# Patient Record
Sex: Female | Born: 1975
Health system: Southern US, Community
[De-identification: ages and names within clinical notes are randomized; demographics above are authoritative.]

## PROBLEM LIST (undated history)

## (undated) DIAGNOSIS — R42 Dizziness and giddiness: Secondary | ICD-10-CM

## (undated) DIAGNOSIS — D649 Anemia, unspecified: Secondary | ICD-10-CM

## (undated) DIAGNOSIS — J302 Other seasonal allergic rhinitis: Secondary | ICD-10-CM

## (undated) DIAGNOSIS — R200 Anesthesia of skin: Secondary | ICD-10-CM

## (undated) DIAGNOSIS — E119 Type 2 diabetes mellitus without complications: Secondary | ICD-10-CM

## (undated) DIAGNOSIS — G4730 Sleep apnea, unspecified: Secondary | ICD-10-CM

## (undated) DIAGNOSIS — R102 Pelvic and perineal pain unspecified side: Secondary | ICD-10-CM

## (undated) DIAGNOSIS — F32A Depression, unspecified: Secondary | ICD-10-CM

## (undated) DIAGNOSIS — H269 Unspecified cataract: Secondary | ICD-10-CM

## (undated) DIAGNOSIS — M1990 Unspecified osteoarthritis, unspecified site: Secondary | ICD-10-CM

## (undated) DIAGNOSIS — Z8739 Personal history of other diseases of the musculoskeletal system and connective tissue: Secondary | ICD-10-CM

## (undated) DIAGNOSIS — F419 Anxiety disorder, unspecified: Secondary | ICD-10-CM

## (undated) DIAGNOSIS — M545 Low back pain, unspecified: Secondary | ICD-10-CM

## (undated) DIAGNOSIS — G8929 Other chronic pain: Secondary | ICD-10-CM

## (undated) DIAGNOSIS — J189 Pneumonia, unspecified organism: Secondary | ICD-10-CM

## (undated) DIAGNOSIS — G7111 Myotonic muscular dystrophy: Secondary | ICD-10-CM

## (undated) DIAGNOSIS — E559 Vitamin D deficiency, unspecified: Secondary | ICD-10-CM

## (undated) DIAGNOSIS — T8859XA Other complications of anesthesia, initial encounter: Secondary | ICD-10-CM

## (undated) DIAGNOSIS — K219 Gastro-esophageal reflux disease without esophagitis: Secondary | ICD-10-CM

## (undated) DIAGNOSIS — N809 Endometriosis, unspecified: Secondary | ICD-10-CM

## (undated) HISTORY — DX: Vitamin D deficiency, unspecified: E55.9

## (undated) HISTORY — DX: Anxiety disorder, unspecified: F41.9

## (undated) HISTORY — DX: Type 2 diabetes mellitus without complications: E11.9

## (undated) HISTORY — PX: WISDOM TOOTH EXTRACTION: SHX21

## (undated) HISTORY — DX: Depression, unspecified: F32.A

## (undated) HISTORY — DX: Myotonic muscular dystrophy: G71.11

---

## 1997-04-24 ENCOUNTER — Other Ambulatory Visit: Admission: RE | Admit: 1997-04-24 | Discharge: 1997-04-24 | Payer: Self-pay | Admitting: Obstetrics and Gynecology

## 1997-05-05 ENCOUNTER — Other Ambulatory Visit: Admission: RE | Admit: 1997-05-05 | Discharge: 1997-05-05 | Payer: Self-pay | Admitting: Obstetrics and Gynecology

## 1997-12-01 ENCOUNTER — Other Ambulatory Visit: Admission: RE | Admit: 1997-12-01 | Discharge: 1997-12-01 | Payer: Self-pay | Admitting: Obstetrics and Gynecology

## 1999-03-05 ENCOUNTER — Other Ambulatory Visit: Admission: RE | Admit: 1999-03-05 | Discharge: 1999-03-05 | Payer: Self-pay | Admitting: Obstetrics and Gynecology

## 2000-03-18 ENCOUNTER — Other Ambulatory Visit: Admission: RE | Admit: 2000-03-18 | Discharge: 2000-03-18 | Payer: Self-pay | Admitting: Obstetrics and Gynecology

## 2000-05-04 ENCOUNTER — Other Ambulatory Visit: Admission: RE | Admit: 2000-05-04 | Discharge: 2000-05-04 | Payer: Self-pay | Admitting: Obstetrics and Gynecology

## 2000-08-06 ENCOUNTER — Other Ambulatory Visit: Admission: RE | Admit: 2000-08-06 | Discharge: 2000-08-06 | Payer: Self-pay | Admitting: Obstetrics and Gynecology

## 2000-08-06 ENCOUNTER — Encounter (INDEPENDENT_AMBULATORY_CARE_PROVIDER_SITE_OTHER): Payer: Self-pay | Admitting: *Deleted

## 2000-12-09 ENCOUNTER — Other Ambulatory Visit: Admission: RE | Admit: 2000-12-09 | Discharge: 2000-12-09 | Payer: Self-pay | Admitting: Obstetrics and Gynecology

## 2001-01-04 ENCOUNTER — Encounter: Payer: Self-pay | Admitting: Emergency Medicine

## 2001-01-04 ENCOUNTER — Emergency Department (HOSPITAL_COMMUNITY): Admission: EM | Admit: 2001-01-04 | Discharge: 2001-01-04 | Payer: Self-pay | Admitting: Emergency Medicine

## 2001-05-31 ENCOUNTER — Other Ambulatory Visit: Admission: RE | Admit: 2001-05-31 | Discharge: 2001-05-31 | Payer: Self-pay | Admitting: Obstetrics and Gynecology

## 2002-07-13 ENCOUNTER — Other Ambulatory Visit: Admission: RE | Admit: 2002-07-13 | Discharge: 2002-07-13 | Payer: Self-pay | Admitting: Obstetrics and Gynecology

## 2003-08-18 ENCOUNTER — Other Ambulatory Visit: Admission: RE | Admit: 2003-08-18 | Discharge: 2003-08-18 | Payer: Self-pay | Admitting: Obstetrics and Gynecology

## 2003-08-24 ENCOUNTER — Encounter: Admission: RE | Admit: 2003-08-24 | Discharge: 2003-08-24 | Payer: Self-pay | Admitting: Family Medicine

## 2004-03-05 ENCOUNTER — Ambulatory Visit: Payer: Self-pay | Admitting: Family Medicine

## 2004-09-03 ENCOUNTER — Other Ambulatory Visit: Admission: RE | Admit: 2004-09-03 | Discharge: 2004-09-03 | Payer: Self-pay | Admitting: Obstetrics and Gynecology

## 2005-03-18 ENCOUNTER — Ambulatory Visit: Payer: Self-pay | Admitting: Family Medicine

## 2005-04-08 ENCOUNTER — Ambulatory Visit: Payer: Self-pay | Admitting: Family Medicine

## 2005-04-23 ENCOUNTER — Ambulatory Visit: Payer: Self-pay | Admitting: Family Medicine

## 2005-05-06 ENCOUNTER — Ambulatory Visit: Payer: Self-pay | Admitting: Family Medicine

## 2005-07-11 ENCOUNTER — Ambulatory Visit: Payer: Self-pay | Admitting: Family Medicine

## 2006-01-24 ENCOUNTER — Ambulatory Visit: Payer: Self-pay | Admitting: Family Medicine

## 2006-01-24 ENCOUNTER — Ambulatory Visit (HOSPITAL_COMMUNITY): Admission: RE | Admit: 2006-01-24 | Discharge: 2006-01-24 | Payer: Self-pay | Admitting: Family Medicine

## 2006-01-26 ENCOUNTER — Ambulatory Visit: Payer: Self-pay | Admitting: Family Medicine

## 2006-04-16 ENCOUNTER — Other Ambulatory Visit: Admission: RE | Admit: 2006-04-16 | Discharge: 2006-04-16 | Payer: Self-pay | Admitting: Obstetrics and Gynecology

## 2006-04-17 ENCOUNTER — Ambulatory Visit: Payer: Self-pay | Admitting: Family Medicine

## 2006-04-17 LAB — CONVERTED CEMR LAB: Hgb A1c MFr Bld: 5.9 % (ref 4.6–6.0)

## 2006-05-20 ENCOUNTER — Ambulatory Visit: Payer: Self-pay | Admitting: Family Medicine

## 2006-06-23 ENCOUNTER — Ambulatory Visit: Payer: Self-pay | Admitting: Family Medicine

## 2006-06-23 DIAGNOSIS — F329 Major depressive disorder, single episode, unspecified: Secondary | ICD-10-CM

## 2006-07-24 ENCOUNTER — Ambulatory Visit: Payer: Self-pay | Admitting: Family Medicine

## 2006-08-03 ENCOUNTER — Ambulatory Visit: Payer: Self-pay | Admitting: Family Medicine

## 2006-08-03 DIAGNOSIS — IMO0001 Reserved for inherently not codable concepts without codable children: Secondary | ICD-10-CM

## 2006-08-25 ENCOUNTER — Encounter: Payer: Self-pay | Admitting: Family Medicine

## 2006-09-11 ENCOUNTER — Encounter: Payer: Self-pay | Admitting: Family Medicine

## 2006-09-19 ENCOUNTER — Encounter: Admission: RE | Admit: 2006-09-19 | Discharge: 2006-09-19 | Payer: Self-pay

## 2007-02-25 ENCOUNTER — Ambulatory Visit (HOSPITAL_BASED_OUTPATIENT_CLINIC_OR_DEPARTMENT_OTHER): Admission: RE | Admit: 2007-02-25 | Discharge: 2007-02-25 | Payer: Self-pay | Admitting: General Surgery

## 2007-02-25 ENCOUNTER — Encounter (INDEPENDENT_AMBULATORY_CARE_PROVIDER_SITE_OTHER): Payer: Self-pay | Admitting: General Surgery

## 2007-02-25 HISTORY — PX: MUSCLE BIOPSY: SHX716

## 2007-04-05 ENCOUNTER — Telehealth: Payer: Self-pay | Admitting: Family Medicine

## 2007-04-22 ENCOUNTER — Encounter: Payer: Self-pay | Admitting: Family Medicine

## 2007-05-28 ENCOUNTER — Encounter: Admission: RE | Admit: 2007-05-28 | Discharge: 2007-05-28 | Payer: Self-pay | Admitting: Obstetrics and Gynecology

## 2007-08-11 ENCOUNTER — Encounter: Payer: Self-pay | Admitting: Family Medicine

## 2007-08-25 ENCOUNTER — Encounter: Payer: Self-pay | Admitting: Family Medicine

## 2007-09-13 ENCOUNTER — Other Ambulatory Visit: Admission: RE | Admit: 2007-09-13 | Discharge: 2007-09-13 | Payer: Self-pay | Admitting: Obstetrics and Gynecology

## 2007-09-24 ENCOUNTER — Ambulatory Visit: Payer: Self-pay | Admitting: Family Medicine

## 2007-09-24 DIAGNOSIS — G7111 Myotonic muscular dystrophy: Secondary | ICD-10-CM

## 2007-10-06 ENCOUNTER — Ambulatory Visit: Payer: Self-pay | Admitting: Cardiology

## 2007-10-20 ENCOUNTER — Ambulatory Visit: Payer: Self-pay | Admitting: Family Medicine

## 2007-10-20 DIAGNOSIS — F17200 Nicotine dependence, unspecified, uncomplicated: Secondary | ICD-10-CM

## 2008-02-15 ENCOUNTER — Telehealth: Payer: Self-pay | Admitting: Family Medicine

## 2008-03-27 ENCOUNTER — Encounter: Payer: Self-pay | Admitting: Family Medicine

## 2008-06-08 ENCOUNTER — Emergency Department (HOSPITAL_COMMUNITY): Admission: EM | Admit: 2008-06-08 | Discharge: 2008-06-08 | Payer: Self-pay | Admitting: Emergency Medicine

## 2008-07-11 ENCOUNTER — Ambulatory Visit: Payer: Self-pay | Admitting: Family Medicine

## 2008-07-11 ENCOUNTER — Telehealth: Payer: Self-pay | Admitting: Family Medicine

## 2008-07-11 DIAGNOSIS — R634 Abnormal weight loss: Secondary | ICD-10-CM

## 2008-07-11 DIAGNOSIS — R519 Headache, unspecified: Secondary | ICD-10-CM | POA: Insufficient documentation

## 2008-07-11 DIAGNOSIS — R51 Headache: Secondary | ICD-10-CM

## 2008-09-25 ENCOUNTER — Ambulatory Visit: Payer: Self-pay | Admitting: Obstetrics and Gynecology

## 2008-12-18 ENCOUNTER — Encounter (INDEPENDENT_AMBULATORY_CARE_PROVIDER_SITE_OTHER): Payer: Self-pay | Admitting: *Deleted

## 2009-01-13 HISTORY — PX: BREAST BIOPSY: SHX20

## 2009-02-13 HISTORY — PX: OTHER SURGICAL HISTORY: SHX169

## 2009-02-14 ENCOUNTER — Other Ambulatory Visit: Admission: RE | Admit: 2009-02-14 | Discharge: 2009-02-14 | Payer: Self-pay | Admitting: Obstetrics and Gynecology

## 2009-02-14 ENCOUNTER — Ambulatory Visit: Payer: Self-pay | Admitting: Obstetrics and Gynecology

## 2009-02-19 ENCOUNTER — Telehealth: Payer: Self-pay | Admitting: Family Medicine

## 2009-02-21 ENCOUNTER — Encounter: Admission: RE | Admit: 2009-02-21 | Discharge: 2009-02-21 | Payer: Self-pay | Admitting: Obstetrics and Gynecology

## 2009-02-28 ENCOUNTER — Ambulatory Visit: Payer: Self-pay | Admitting: Women's Health

## 2009-05-29 ENCOUNTER — Ambulatory Visit: Payer: Self-pay | Admitting: Gynecology

## 2009-09-06 ENCOUNTER — Telehealth: Payer: Self-pay | Admitting: Family Medicine

## 2009-09-12 ENCOUNTER — Ambulatory Visit: Payer: Self-pay | Admitting: Family Medicine

## 2009-11-07 ENCOUNTER — Ambulatory Visit: Payer: Self-pay | Admitting: Obstetrics and Gynecology

## 2009-11-16 ENCOUNTER — Encounter: Admission: RE | Admit: 2009-11-16 | Discharge: 2009-11-16 | Payer: Self-pay | Admitting: Obstetrics and Gynecology

## 2009-12-26 ENCOUNTER — Ambulatory Visit: Payer: Self-pay | Admitting: Family Medicine

## 2010-02-14 NOTE — Progress Notes (Signed)
Summary: refill lyrica  Phone Note From Pharmacy   [facility]er: [facility]  [phone]* Call For: Valerie Friedman  Summary of Call: refill lyrica 25mg  1 by mouth two times a day Initial call taken by: Alfred Levins, CMA,  February 19, 2009 9:25 AM  Follow-up for Phone Call        call in #60 with 11 rf Follow-up by: Nelwyn Salisbury MD,  February 19, 2009 12:51 PM  Additional Follow-up for Phone Call Additional follow up Details #1::        Phone call completed, Pharmacist called Additional Follow-up by: Alfred Levins, CMA,  February 19, 2009 3:41 PM    Prescriptions: LYRICA 25 MG CAPS (PREGABALIN) two times a day  #60 x 11   Entered by:   Alfred Levins, CMA   Authorized by:   Nelwyn Salisbury MD   Signed by:   Alfred Levins, CMA on 02/19/2009   Method used:   Telephoned to ...       [facility]  [phone]* (retail)       [address] [place], [place]  [address]       Ph: [phone] or [phone]       Fax: [phone]   RxID:   [id number]

## 2010-02-14 NOTE — Assessment & Plan Note (Signed)
Summary: med check/refill/cjr/pt rsc/cjr   Vital Signs:  Patient profile:   35 year old female Weight:      102 pounds BP sitting:   84 / 50  (left arm) Cuff size:   regular  Vitals Entered By: Raechel Ache, RN (September 12, 2009 3:37 PM) CC: Not sure why she's here - meds ok.   History of Present Illness: Here for med refills. She is doing fairly well with her back pain and migraines. She averages about 5 or 6 migraines a year. No other health changes  Allergies: 1)  * Yellow Dye 2)  Vicodin (Hydrocodone-Acetaminophen)  Past History:  Past Medical History: Reviewed history from 07/11/2008 and no changes required. Depression myotonic muscular dystrophy, sees Dr. Lyndel Safe at [facility] Neurology fibromyalgia Headache  Review of Systems  The patient denies anorexia, fever, weight loss, weight gain, vision loss, decreased hearing, hoarseness, chest pain, syncope, dyspnea on exertion, peripheral edema, prolonged cough, hemoptysis, abdominal pain, melena, hematochezia, severe indigestion/heartburn, hematuria, incontinence, genital sores, muscle weakness, suspicious skin lesions, transient blindness, difficulty walking, depression, unusual weight change, abnormal bleeding, enlarged lymph nodes, angioedema, breast masses, and testicular masses.    Physical Exam  General:  Well-developed,well-nourished,in no acute distress; alert,appropriate and cooperative throughout examination Neck:  No deformities, masses, or tenderness noted. Lungs:  Normal respiratory effort, chest expands symmetrically. Lungs are clear to auscultation, no crackles or wheezes. Heart:  Normal rate and regular rhythm. S1 and S2 normal without gallop, murmur, click, rub or other extra sounds. Neurologic:  alert & oriented X3, cranial nerves II-XII intact, strength normal in all extremities, and gait normal.     Impression & Recommendations:  Problem # 1:  HEADACHE (ICD-784.0)  The following medications were  removed from the medication list:    Ibuprofen 800 Mg Tabs (Ibuprofen) .Marland Kitchen... 1 by mouth once daily Her updated medication list for this problem includes:    Tramadol Hcl 50 Mg Tabs (Tramadol hcl) .Marland Kitchen... 1 or 2 two times a day    Midrin 325-65-100 Mg Caps (Apap-isometheptene-dichloral) .Marland Kitchen... As directed for ha's  Problem # 2:  DEPRESSION (ICD-311)  Problem # 3:  MYALGIA/MYOSITIS NOS (ICD-729.1)  The following medications were removed from the medication list:    Ibuprofen 800 Mg Tabs (Ibuprofen) .Marland Kitchen... 1 by mouth once daily Her updated medication list for this problem includes:    Tramadol Hcl 50 Mg Tabs (Tramadol hcl) .Marland Kitchen... 1 or 2 two times a day  Complete Medication List: 1)  Tramadol Hcl 50 Mg Tabs (Tramadol hcl) .Marland Kitchen.. 1 or 2 two times a day 2)  Midrin 325-65-100 Mg Caps (Apap-isometheptene-dichloral) .... As directed for ha's 3)  Lyrica 25 Mg Caps (Pregabalin) .... Two times a day  Patient Instructions: 1)  Please schedule a follow-up appointment in 6 months .  Prescriptions: TRAMADOL HCL 50 MG TABS (TRAMADOL HCL) 1 or 2 two times a day  #120 x 5   Entered and Authorized by:   Nelwyn Salisbury MD   Signed by:   Nelwyn Salisbury MD on 09/12/2009   Method used:   Print then Give to Patient   RxID:   [id number] LYRICA 25 MG CAPS (PREGABALIN) two times a day  #60 x 5   Entered and Authorized by:   Nelwyn Salisbury MD   Signed by:   Nelwyn Salisbury MD on 09/12/2009   Method used:   Print then Give to Patient   RxID:   [id number]

## 2010-02-14 NOTE — Progress Notes (Signed)
Summary: meds  Phone Note Outgoing Call   Call placed by: Raechel Ache, RN,  September 06, 2009 10:58 AM Call placed to: Patient Summary of Call: advised she needs OV before refills- she's scheduled for tomorrow- asking for a few Tramadol- is completely out and having withdrawal. Initial call taken by: Raechel Ache, RN,  September 06, 2009 10:59 AM  Follow-up for Phone Call        call in Tramadol #30 with no rf Follow-up by: Nelwyn Salisbury MD,  September 06, 2009 11:19 AM    Prescriptions: TRAMADOL HCL 50 MG TABS (TRAMADOL HCL) 1 or 2 every 6 hours as needed pain  #30 x 0   Entered by:   Raechel Ache, RN   Authorized by:   Nelwyn Salisbury MD   Signed by:   Raechel Ache, RN on 09/06/2009   Method used:   Electronically to        [facility]  [phone]* (retail)       [address] [place], [place]  [address]       Ph: [phone] or [phone]       Fax: [phone]   RxID:   [id number]

## 2010-02-14 NOTE — Progress Notes (Signed)
Summary: Call A Nurse   Call-A-Nurse Triage Call Report Triage Record Num: [id number] Operator: Albertine Grates Patient Name: Valerie Friedman Call Date & Time: 09/05/2009 7:48:47PM Patient Phone: [phone] PCP: Tera Mater. Clent Ridges Patient Gender: Female PCP Fax : [phone] Patient DOB: 1975-04-09 Practice Name: [facility] Reason for Call: States takes Tramadol 100mg  BID and has taken evening dose. Is out of med and is needing refilled. Pharmacy faxed refill request 1 week ago but has not been filled. Is wanting morning dose and enough med until gets script filled 8-25. Advised to call office 8-25. Protocol(s) Used: Medication Question Calls, No Triage (Adults) Recommended Outcome per Protocol: Call Provider within 24 Hours Reason for Outcome: Caller requesting a non urgent new prescription or refill and triager unable to refill per unit policy Care Advice:  ~ 08/

## 2010-02-20 ENCOUNTER — Other Ambulatory Visit: Payer: Self-pay

## 2010-03-14 ENCOUNTER — Encounter: Payer: Self-pay | Admitting: Women's Health

## 2010-03-14 ENCOUNTER — Other Ambulatory Visit (HOSPITAL_COMMUNITY)
Admission: RE | Admit: 2010-03-14 | Discharge: 2010-03-14 | Disposition: A | Discharge status: Home / Self Care | Payer: Medicare Other | Source: Ambulatory Visit | Attending: Obstetrics and Gynecology | Admitting: Obstetrics and Gynecology

## 2010-03-14 ENCOUNTER — Encounter (INDEPENDENT_AMBULATORY_CARE_PROVIDER_SITE_OTHER): Payer: Medicare Other | Admitting: Obstetrics and Gynecology

## 2010-03-14 ENCOUNTER — Other Ambulatory Visit: Payer: Self-pay | Admitting: Women's Health

## 2010-03-14 ENCOUNTER — Ambulatory Visit: Payer: Self-pay | Admitting: Family Medicine

## 2010-03-14 DIAGNOSIS — N6019 Diffuse cystic mastopathy of unspecified breast: Secondary | ICD-10-CM

## 2010-03-14 DIAGNOSIS — N912 Amenorrhea, unspecified: Secondary | ICD-10-CM

## 2010-03-14 DIAGNOSIS — Z124 Encounter for screening for malignant neoplasm of cervix: Secondary | ICD-10-CM | POA: Insufficient documentation

## 2010-03-14 DIAGNOSIS — B373 Candidiasis of vulva and vagina: Secondary | ICD-10-CM

## 2010-03-14 HISTORY — PX: OTHER SURGICAL HISTORY: SHX169

## 2010-03-22 ENCOUNTER — Other Ambulatory Visit: Payer: Self-pay | Admitting: Family Medicine

## 2010-03-22 NOTE — Telephone Encounter (Signed)
Pt called about refill for Tramadol 50mg ...Marland KitchenMarland KitchenMarland Kitchen [facility]... Pt has appt with Dr Clent Ridges on 3.12.12 for f/u.

## 2010-03-25 ENCOUNTER — Ambulatory Visit: Payer: Medicare Other | Admitting: Family Medicine

## 2010-03-29 ENCOUNTER — Encounter: Payer: Self-pay | Admitting: Family Medicine

## 2010-04-05 ENCOUNTER — Ambulatory Visit (INDEPENDENT_AMBULATORY_CARE_PROVIDER_SITE_OTHER): Payer: Medicare Other | Admitting: Family Medicine

## 2010-04-05 ENCOUNTER — Encounter: Payer: Self-pay | Admitting: Family Medicine

## 2010-04-05 VITALS — BP 90/60 | HR 90 | Temp 98.7°F | Wt 103.0 lb

## 2010-04-05 DIAGNOSIS — R599 Enlarged lymph nodes, unspecified: Secondary | ICD-10-CM

## 2010-04-05 DIAGNOSIS — R591 Generalized enlarged lymph nodes: Secondary | ICD-10-CM

## 2010-04-05 DIAGNOSIS — R110 Nausea: Secondary | ICD-10-CM

## 2010-04-05 DIAGNOSIS — M791 Myalgia, unspecified site: Secondary | ICD-10-CM

## 2010-04-05 MED ORDER — PREGABALIN 25 MG PO CAPS: 25.0000 mg | ORAL_CAPSULE | Freq: Two times a day (BID) | ORAL | Status: DC

## 2010-04-05 MED ORDER — TRAMADOL HCL 50 MG PO TABS: 50.0000 mg | ORAL_TABLET | Freq: Four times a day (QID) | ORAL | Status: DC | PRN

## 2010-04-05 NOTE — Progress Notes (Signed)
  Subjective:    Patient ID: Valerie Friedman, female    DOB: 22-Feb-1975, 35 y.o.   MRN: [id number]  HPI Here for several issues. First about 2 weeks ago she developed several tender enlarged nodes in the neck. No fever or ST or URI symptoms. She saw Dr. Annalee Genta 3 days ago, and he started her on 10 days of Augmentin. The nodes are already less tender than before but are still the same size. She asks me basically for a second opinion. Also, she needs refills on tramadol and Lyrica. Also she has had chronic nausea every day for the past 6 months. No heartburn or trouble swallowing. No abdominal pain. Appetite is normal. She recently had a negative pregnancy test and a normal exam per Dr. Eda Paschal.    Review of Systems  Constitutional: Negative.   Respiratory: Negative.   Gastrointestinal: Positive for nausea. Negative for vomiting and abdominal pain.  Genitourinary: Negative.   Neurological: Positive for headaches.  Hematological: Positive for adenopathy.       Objective:   Physical Exam  Constitutional: She is oriented to person, place, and time. She appears well-developed and well-nourished.  HENT:  Head: Normocephalic and atraumatic.  Right Ear: External ear normal.  Left Ear: External ear normal.  Nose: Nose normal.  Mouth/Throat: Oropharynx is clear and moist. No oropharyngeal exudate.  Eyes: Conjunctivae are normal. Pupils are equal, round, and reactive to light.  Neck: Normal range of motion. Neck supple. No thyromegaly present.       Has two small nontender nodes in the neck, one on each side   Abdominal: Soft. Bowel sounds are normal. She exhibits no distension and no mass. There is no tenderness. There is no rebound and no guarding.  Neurological: She is alert and oriented to person, place, and time. She has normal reflexes. No cranial nerve deficit.          Assessment & Plan:  These are probably reactive nodes, and I agree with her taking Augmentin. She is supposed  to follow up with Dr. Annalee Genta in 2 weeks, and I encouraged her to keep this appt. We will arrange for her to have some labs drawn including a CBC. Refilled her meds. As for the nausea, this may be gastritis or GERD, so I asked her to try Prilosec OTC every day.

## 2010-04-16 ENCOUNTER — Telehealth: Payer: Self-pay | Admitting: *Deleted

## 2010-04-16 NOTE — Telephone Encounter (Signed)
Pt's Mom is going to be drawing pt's blood before the next visit, and needs a list of what was ordered. Did not see it in chart?  Please call Mom with list of labs.

## 2010-04-17 NOTE — Telephone Encounter (Signed)
Left mess on her mobile phone as to what Dr Clent Ridges wants for labs. Instructed for call back if questions.

## 2010-04-17 NOTE — Telephone Encounter (Signed)
She needs a CBC with differential, BMET, hepatic panel, amylase, and  TSH

## 2010-05-03 ENCOUNTER — Encounter: Payer: Self-pay | Admitting: Family Medicine

## 2010-05-28 NOTE — Op Note (Signed)
Valerie Friedman, Valerie Friedman            ACCOUNT NO.:  [id number]   MEDICAL RECORD NO.:  [id number]          PATIENT TYPE:  AMB   LOCATION:  [facility]SC                          FACILITY:  [facility]   PHYSICIAN:  Angelia Mould. Derrell Lolling, M.D.DATE OF BIRTH:  1975-02-18   DATE OF PROCEDURE:  02/25/2007  DATE OF DISCHARGE:                               OPERATIVE REPORT   PREOPERATIVE DIAGNOSIS:  Myositis.   POSTOPERATIVE DIAGNOSIS:  Myositis.   OPERATION PERFORMED:  Left thigh quadriceps muscle biopsy x3.   SURGEON:  Angelia Mould. Derrell Lolling, M.D.   OPERATIVE INDICATIONS:  This is a 35 year old white female who has had  muscle aches and pains in the lower back and sometimes thighs.  She has  early fatigability.  She has had elevated CPK.  She has been evaluated  by Dr. Chase Picket.  He has requested a muscle biopsy to clarify her  diagnosis.  She has been counseled as an outpatient regarding this.  She  is brought to operating room electively.   OPERATIVE TECHNIQUE:  The patient was placed supine on the operating  table.  A general anesthesia using an LMA device was performed.  No  muscle relaxers were used.  The patient was identified as correct  patient and correct procedure and correct site.  Intravenous antibiotics  were given.  The left thigh was prepped and draped in a sterile fashion.  Then 0.5% Marcaine with epinephrine was used as local infiltration  anesthetic infiltrating the skin, and subcutaneous tissue only.  A  longitudinal incision was made in the proximal anterolateral left thigh.  Dissection was carried down through the subcutaneous tissue.   I incised the deep investing fascia of the muscle bundles.  Self-  retaining retractors were placed.  I isolated 3 separate bundles of  muscle.  I tried to get a bundle of muscle that was 1 cm in cross-  sectional diameter and about 2.5 cm in length.  I isolated these and  clamped them proximally and distally with hemostats, and divided the  muscle with  a knife.  I placed it on a tongue blade and then stapled it  with staples, and then wrapped it in saline moistened gauze.  The muscle  ends were tied off with 2-0 Vicryl ties.  I did this 3 separate times.  Hemostasis was excellent.   The wound was irrigated with saline.  The deeper tissues were closed  with a running suture of 2-0 Vicryl and the skin closed with running  subcuticular suture of 4-0 Monocryl and Steri-Strips.  Clean bandages  were placed, and the patient taken to the recovery room in stable  condition.  Estimated blood loss was about 10 mL.  Complications were  none.  Sponge, needle, and counts were correct.     Angelia Mould. Derrell Lolling, M.D.  Electronically Signed    HMI/MEDQ  D:  02/25/2007  T:  02/26/2007  Job:  [id number]   cc:   Areatha Keas, M.D.

## 2010-05-28 NOTE — Assessment & Plan Note (Signed)
[facility]                            CARDIOLOGY OFFICE NOTE   [name], Friedman                   MRN:          [id number]  DATE:10/06/2007                            DOB:          Dec 17, 1975    I was asked by Dr. Gershon Crane to evaluate Valerie Friedman with  question of cardiac involvement with myotonic dystrophy.   HISTORY OF PRESENT ILLNESS:  She is 35 years of age, single white female  who began to have neck pain, back pain, and weakness after exercising.  This started about 2 years ago.  In July 2009, she was diagnosed with  myotonic dystrophy by Dr. Melbourne Abts.   Because of an association of electrical abnormalities, conduction  abnormalities, and cardiomyopathies with myotonic dystrophy, she is  referred for evaluation.   She denies any palpitations, presyncope, or syncope.  She has not been  exercising that much lately, but has no exertion related symptoms such  as dyspnea on exertion.  She has had no orthopnea, PND, or peripheral  edema.   She does have a history of some vertigo intermittently, but that has  been going on for about 6 years.  She has had no true syncope, however.   PAST MEDICAL HISTORY:  Intolerant of Vicodin, which causes dizziness.  She smokes half-a-pack of cigarettes a day and has for 10 years.  She  drinks about 3 alcoholic beverages a week.   CURRENT MEDICATIONS:  1. Lyrica 25 mg p.o. b.i.d.  2. Birth control.  3. Tramadol 200 mg b.i.d.   SURGICAL HISTORY:  Had multiple biopsy in 2009.   FAMILY HISTORY:  Negative for premature coronary disease or sudden  cardiac death.   SOCIAL HISTORY:  She became disabled in July 2009.   REVIEW OF SYSTEMS:  Other than some seasonal allergies, menstrual  dysfunction and some anxiety is negative.   PHYSICAL EXAMINATION:  VITAL SIGNS:  Blood pressure is 92/60, her pulse  is 72 and regular, her weight is 114.  She is 5 feet 2-1/4 inches.  HEENT:  Normocephalic and  atraumatic.  PERRLA.  Extraocular movement is  intact.  Sclerae are clear.  Facial symmetry is normal.  NECK:  Carotids upstrokes were equal bilaterally without bruits.  No  JVD.  Thyroid is not enlarged.  Trachea is midline.  Neck is supple.  LUNGS:  Clear to auscultation and percussion.  HEART:  Reveals a regular rate and rhythm.  Nondisplaced PMI.  Normal S1  and S2.  No gallop.  ABDOMEN:  Soft.  No midline bruit.  No pulsatile mass.  No  hepatomegalia.  EXTREMITIES:  No muscle tenderness.  Pulses are 4+ bilaterally  symmetrical.  There is no sign of DVT.  NEUROLOGIC:  Grossly intact.  SKIN:  Unremarkable.   EKG is completely normal with normal PR, QRS, and QTc interval.  There  is no ectopy.  There is no ST-segment changes.  There is no bundle-  branch block.   ASSESSMENT AND PLAN:  Myotonic dystrophy, clinically there is no  evidence of myocardial involvement though this may be missed by clinical  exam,  not to mention history.  It is encouraged and her EKG is normal  with no conduction abnormalities or electrical abnormalities.   PLAN:  A 2-D echocardiogram to assess for any myocardial involvement.  If her echocardiogram is normal, appropriate reassurance will be given.  I will see her back in 6 months.  If she does have significant LV  dysfunction, we referred her to electrophysiology for further evaluation  and possible defibrillator implantation.     Thomas C. Daleen Squibb, MD, [facility]  Electronically Signed    TCW/MedQ  DD: 10/06/2007  DT: 10/07/2007  Job #: [id number]   cc:   Jeannett Senior A. Clent Ridges, MD  Evie Lacks, MD

## 2010-05-31 NOTE — Assessment & Plan Note (Signed)
[facility]                        [facility] OFFICE NOTE   SHERLE, MELLO                   MRN:          [id number]  DATE:01/24/2006                            DOB:          June 29, 1975    HISTORY OF PRESENT ILLNESS:  Ms. Valerie Friedman is a 35 year old female  presenting with a 2-week history of a left-sided headache.  She reports  that 1 month ago she was treated for a sinus infection by her  gynecologist with a Z-pack.  She reports that she felt better but then  started to have a headache behind her left eye that radiated up to the  top of her head.  She described it as a constant pressure-type headache.  The pain was 8 out of 10 on a pain scale.  She reports she  intermittently has had some dizziness that she attributed to her  allergies.  She also has some blurry vision off and on for the last 2 to  3 weeks.  She reports that she presented to the office yesterday  afternoon and was scheduled to be seen at the Saturday clinic today.   The patient states that she has tried some over-the-counter  decongestant, Tylenol and Advil with minimal improvement.  She states  that she has had worse headaches in the past, this one is more constant.  According to Ms. Alberson her mother, who works in a laboratory, had a  CBC done and it was unremarkable, but there was a question of whether  there was a low white blood count or hemoglobin.  She denies any syncope  or presyncopal episodes.  She denies any nausea, but has had a couple of  episodes of vomiting.   PAST MEDICAL HISTORY:  Depression.   MEDICATIONS:  1. Lexapro.  2. Ortho Tri-Cyclen.   ALLERGIES:  None, but VICODIN MAKES HER FEEL DRUNK.  She can tolerate  Darvocet with no problems.   REVIEW OF SYSTEMS:  As per HPI, otherwise unremarkable.   OBJECTIVE:  Temperature 96.0, pulse 61, blood pressure 95/66.  GENERAL:  We have a pleasant female who appears in no acute distress,  answers questions appropriately, alert and oriented x3.  Does not appear  to be in significant pain at this time.  Appears comfortable.  HEENT:  Normocephalic, atraumatic.  Pupils were equal and reactive to  light, extraocular muscles were intact.  No nystagmus was appreciated.  Nasal mucosa was boggy with yellow to clear nasal discharge.  Oropharynx  was unremarkable.  NECK:  Supple, no lymphadenopathy, carotid bruits or JVD.  No  thyromegaly.  LUNGS:  Clear.  HEART:  Regular rate and rhythm, normal S1, S2, no murmurs, gallops or  rubs.  EXTREMITIES:  No cyanosis, clubbing or edema.  NEUROLOGIC:  Cranial nerves II-XII grossly intact, no focal sensory or  motor deficits noted.  Deep tendon reflexes are 2+ and equal  bilaterally, no cerebellar dysfunction was noted.  No pronator drift or  gait disturbance was appreciated.   IMPRESSION:  [age]-year-old female presenting with a 2 to 3-week  history of left-sided headache.  The patient does have a strong history  of allergies as well as a current sinusitis, but given how long she has  had this headache further evaluation was warranted.   EVALUATION COURSE:  1. Ms. Youse was initially advised to go to the emergency      department, given her above symptoms and the question of abnormal      CBC.  I do not feel comfortable with her disposition  until at      least a CT scan and a CBC is done.  There was a misunderstanding on      the instructions, and Ms. Ostrosky went to [facility].      Dr. Bradly Chris called me to advise me that she was there.  I went ahead      and gave an order for a CT scan of the head, and subsequently      received the report that stated no acute intracranial findings,      right chronic maxillary sinus disease.  I was able to speak to Ms.      Agramonte and advise her of the above finding.  I was also able to      speak to her mother, Valerie Friedman.  She did confirm with me      that her white  count was within normal limit at 6.8, and her      hemoglobin and hematocrit were also normal.  Given the normal CT      scan and confirmation of the CBC by her mother as well as Ms.      Demby being stable in no significant distress, I recommended      followup with Dr. Clent Ridges Monday.  Precautions were obviously reviewed      with both her and her mother.  I recommended that if her headaches      worsen or are complicated with any other symptoms she is to be      assessed immediately in the emergency department regardless of a      unremarkable CT scan.  2. I did provide a prescription for Darvocet N 100, which was [facility]      [facility], phone number [phone].  She is to take 1      pill every 6 hours as needed for headache.  She was given #10 with      no refills, and the pharmacy was advised to review side effects      with the patient.  The patient was comfortable with the plan, and      will follow up accordingly.     Leanne Chang, M.D.  Electronically Signed    LA/MedQ  DD: 01/24/2006  DT: 01/24/2006  Job #: [id number]   cc:   Jeannett Senior A. Clent Ridges, MD

## 2010-06-12 ENCOUNTER — Telehealth: Payer: Self-pay | Admitting: *Deleted

## 2010-06-12 DIAGNOSIS — M791 Myalgia, unspecified site: Secondary | ICD-10-CM

## 2010-06-12 NOTE — Telephone Encounter (Signed)
Request from pharmacy to fill #20 of Tramadol 50mg  tab [last filled 04/05/10 #120x5], due to patient leaving med bottle in [place] & needing enough to cover until insurance will pay for refill on 06/15/10. Please advise.

## 2010-06-13 MED ORDER — TRAMADOL HCL 50 MG PO TABS: 50.0000 mg | ORAL_TABLET | Freq: Four times a day (QID) | ORAL | Status: DC | PRN

## 2010-06-13 NOTE — Telephone Encounter (Signed)
Rx Done . 

## 2010-06-13 NOTE — Telephone Encounter (Signed)
Please call in #20 as above

## 2010-06-14 ENCOUNTER — Other Ambulatory Visit: Payer: Self-pay | Admitting: *Deleted

## 2010-06-14 DIAGNOSIS — M791 Myalgia, unspecified site: Secondary | ICD-10-CM

## 2010-06-14 NOTE — Telephone Encounter (Signed)
patient  Is aware rx ready at pharmacy

## 2010-07-02 ENCOUNTER — Ambulatory Visit: Payer: Medicare Other | Admitting: Obstetrics and Gynecology

## 2010-07-03 ENCOUNTER — Ambulatory Visit: Payer: Medicare Other | Admitting: Obstetrics and Gynecology

## 2010-08-05 ENCOUNTER — Encounter: Payer: Self-pay | Admitting: Family Medicine

## 2010-08-05 ENCOUNTER — Ambulatory Visit (INDEPENDENT_AMBULATORY_CARE_PROVIDER_SITE_OTHER): Payer: Medicare Other | Admitting: Family Medicine

## 2010-08-05 VITALS — BP 98/68 | HR 92 | Temp 98.7°F | Wt 105.0 lb

## 2010-08-05 DIAGNOSIS — M25569 Pain in unspecified knee: Secondary | ICD-10-CM

## 2010-08-05 DIAGNOSIS — G7111 Myotonic muscular dystrophy: Secondary | ICD-10-CM

## 2010-08-05 DIAGNOSIS — M791 Myalgia, unspecified site: Secondary | ICD-10-CM

## 2010-08-05 LAB — CBC WITH DIFFERENTIAL/PLATELET
Eosinophils Relative: 0.7 % (ref 0.0–5.0)
Hemoglobin: 13.9 g/dL (ref 12.0–15.0)
Lymphs Abs: 1.3 10*3/uL (ref 0.7–4.0)
Monocytes Relative: 4.5 % (ref 3.0–12.0)
Neutro Abs: 4.8 10*3/uL (ref 1.4–7.7)
Neutrophils Relative %: 74.7 % (ref 43.0–77.0)
Platelets: 209 10*3/uL (ref 150.0–400.0)
RBC: 4.45 Mil/uL (ref 3.87–5.11)

## 2010-08-05 LAB — BASIC METABOLIC PANEL
BUN: 11 mg/dL (ref 6–23)
CO2: 29 mEq/L (ref 19–32)
Calcium: 9.2 mg/dL (ref 8.4–10.5)
Creatinine, Ser: 0.6 mg/dL (ref 0.4–1.2)
Potassium: 4.6 mEq/L (ref 3.5–5.1)
Sodium: 139 mEq/L (ref 135–145)

## 2010-08-05 LAB — TSH: TSH: 0.5 u[IU]/mL (ref 0.35–5.50)

## 2010-08-05 LAB — HEPATIC FUNCTION PANEL
AST: 24 U/L (ref 0–37)
Albumin: 4.2 g/dL (ref 3.5–5.2)
Alkaline Phosphatase: 38 U/L — ABNORMAL LOW (ref 39–117)
Total Bilirubin: 0.6 mg/dL (ref 0.3–1.2)
Total Protein: 7.6 g/dL (ref 6.0–8.3)

## 2010-08-05 MED ORDER — TRAMADOL HCL 50 MG PO TABS: 50.0000 mg | ORAL_TABLET | Freq: Four times a day (QID) | ORAL | Status: DC | PRN

## 2010-08-05 NOTE — Progress Notes (Signed)
  Subjective:    Patient ID: Valerie Friedman, female    DOB: 1975/12/07, 35 y.o.   MRN: [id number]  HPI Here asking for med refills and for a referral. She has had bilateral knee pain for years, the right being worse than the left. She saw an orthopedist in [place] 10 yrs ago, and he told her it was patellofemoral syndrome. Now it is getting worse, and she has daily pain. Using Tramadol and Advil.    Review of Systems  Constitutional: Negative.   Musculoskeletal: Positive for arthralgias.       Objective:   Physical Exam  Constitutional: She appears well-developed and well-nourished.  Musculoskeletal:       The right knee has crepitus and tenderness around the patella. Full ROM. No joint space tenderness or edema          Assessment & Plan:  Refer to orthopedics.

## 2010-09-20 ENCOUNTER — Encounter: Payer: Self-pay | Admitting: Obstetrics and Gynecology

## 2010-09-20 ENCOUNTER — Ambulatory Visit (INDEPENDENT_AMBULATORY_CARE_PROVIDER_SITE_OTHER): Payer: Medicare Other | Admitting: Obstetrics and Gynecology

## 2010-09-20 VITALS — Wt 102.0 lb

## 2010-09-20 DIAGNOSIS — R5381 Other malaise: Secondary | ICD-10-CM

## 2010-09-20 DIAGNOSIS — M797 Fibromyalgia: Secondary | ICD-10-CM | POA: Insufficient documentation

## 2010-09-20 DIAGNOSIS — G43909 Migraine, unspecified, not intractable, without status migrainosus: Secondary | ICD-10-CM | POA: Insufficient documentation

## 2010-09-20 DIAGNOSIS — R509 Fever, unspecified: Secondary | ICD-10-CM

## 2010-09-20 DIAGNOSIS — Z113 Encounter for screening for infections with a predominantly sexual mode of transmission: Secondary | ICD-10-CM

## 2010-09-20 DIAGNOSIS — Z20828 Contact with and (suspected) exposure to other viral communicable diseases: Secondary | ICD-10-CM

## 2010-09-20 DIAGNOSIS — B3731 Acute candidiasis of vulva and vagina: Secondary | ICD-10-CM

## 2010-09-20 DIAGNOSIS — N898 Other specified noninflammatory disorders of vagina: Secondary | ICD-10-CM

## 2010-09-20 DIAGNOSIS — B373 Candidiasis of vulva and vagina: Secondary | ICD-10-CM

## 2010-09-20 DIAGNOSIS — G7111 Myotonic muscular dystrophy: Secondary | ICD-10-CM | POA: Insufficient documentation

## 2010-09-20 DIAGNOSIS — R6883 Chills (without fever): Secondary | ICD-10-CM

## 2010-09-20 DIAGNOSIS — R5383 Other fatigue: Secondary | ICD-10-CM

## 2010-09-20 DIAGNOSIS — Z1159 Encounter for screening for other viral diseases: Secondary | ICD-10-CM

## 2010-09-20 DIAGNOSIS — L293 Anogenital pruritus, unspecified: Secondary | ICD-10-CM

## 2010-09-20 MED ORDER — TERCONAZOLE 0.8 % VA CREA: 1.0000 | TOPICAL_CREAM | Freq: Every day | VAGINAL | Status: AC

## 2010-09-20 NOTE — Progress Notes (Signed)
The patient came to see me with 3 separate problems. The first is that she did breasts exam the other night and thought that she was more lumpier  in the left breasts then previously. The second is that she has a heavy vaginal discharge which is bothering her. she's also having some vulvitis with it. She tried Monistat without results. She also gets dysmenorrhea with her periods that does not respond to ibuprofen. She does take Percocet for her knee but feels that she took 2 pills of that daily during her period that would resolve the above. She has recently started a new relationship and wants to be checked for STD. Please also note that she's been assessed with mammography and ultrasound in this area of her left breast in November of 2011.  Breasts: Carefully examined bilaterally both sitting and lying. She is more lumpy in the upper outer quadrant of the left breast and lower outer quadrant but there are no dominant lesions.  External: Within normal limits BUS: Within normal limits vaginal exam: Increase discharge with wet prep positive for yeast cervix: Within normal limits. Uterus normal size and shape. Adnexa: No masses. Rectovaginal negative.  Assessment: 1. Yeast vaginitis 2. Dysmenorrhea 3. Fibrocystic breasts  Plan: Patient was treated with terconazole 3 cream today. Patient was reassured about her breasts today. Told her she certainly change rather than waiting a 40 for her next mammogram we should do another one at that time. STD testing done. Tylox #20 with no refills for dysmenorrhea.

## 2010-09-21 LAB — HIV ANTIBODY (ROUTINE TESTING W REFLEX): HIV: NONREACTIVE

## 2010-10-04 LAB — COMPREHENSIVE METABOLIC PANEL
ALT: 18
AST: 26
CO2: 29
Calcium: 9.8
Chloride: 105
GFR calc Af Amer: >60
GFR calc non Af Amer: >60
Glucose, Bld: 106 — ABNORMAL HIGH
Sodium: 142
Total Bilirubin: 0.7

## 2010-10-04 LAB — CBC
Hemoglobin: 13.1
MCHC: 33.5
MCV: 90.4
RBC: 4.32
WBC: 7.7

## 2010-10-04 LAB — DIFFERENTIAL
Basophils Absolute: 0
Basophils Relative: 0
Eosinophils Absolute: 0
Eosinophils Relative: 1
Neutrophils Relative %: 70

## 2010-10-04 LAB — URINALYSIS, ROUTINE W REFLEX MICROSCOPIC
Bilirubin Urine: NEGATIVE
Hgb urine dipstick: NEGATIVE
Specific Gravity, Urine: 1.027
Urobilinogen, UA: 0.2
pH: 5.5

## 2010-10-04 LAB — HCG, SERUM, QUALITATIVE: Preg, Serum: NEGATIVE

## 2010-12-16 ENCOUNTER — Ambulatory Visit (INDEPENDENT_AMBULATORY_CARE_PROVIDER_SITE_OTHER): Payer: Medicare Other | Admitting: Obstetrics and Gynecology

## 2010-12-16 DIAGNOSIS — B9689 Other specified bacterial agents as the cause of diseases classified elsewhere: Secondary | ICD-10-CM

## 2010-12-16 DIAGNOSIS — B373 Candidiasis of vulva and vagina: Secondary | ICD-10-CM

## 2010-12-16 DIAGNOSIS — N760 Acute vaginitis: Secondary | ICD-10-CM

## 2010-12-16 DIAGNOSIS — L293 Anogenital pruritus, unspecified: Secondary | ICD-10-CM

## 2010-12-16 DIAGNOSIS — A499 Bacterial infection, unspecified: Secondary | ICD-10-CM

## 2010-12-16 DIAGNOSIS — N949 Unspecified condition associated with female genital organs and menstrual cycle: Secondary | ICD-10-CM

## 2010-12-16 DIAGNOSIS — N938 Other specified abnormal uterine and vaginal bleeding: Secondary | ICD-10-CM

## 2010-12-16 DIAGNOSIS — N898 Other specified noninflammatory disorders of vagina: Secondary | ICD-10-CM

## 2010-12-16 MED ORDER — METRONIDAZOLE 0.75 % VA GEL: 1.0000 | Freq: Two times a day (BID) | VAGINAL | Status: AC

## 2010-12-16 MED ORDER — FLUCONAZOLE 200 MG PO TABS: 200.0000 mg | ORAL_TABLET | Freq: Every day | ORAL | Status: AC

## 2010-12-16 NOTE — Progress Notes (Signed)
The patient came to see me today with several problems. Initially she was coming because she felt something in her breast. However she says she reexamine her breast today and it is disappeared. She does however have several other problems. The first is she's noticed a firm lesion on her right labia that has been there for 3 weeks. She tried to open it and could not get any drainage out of it. Her second issue is that she saw her dermatologist and she has a lot of bumps on her chin and he feels due to excessive testosterone. He wanted me to check her testosterone level.  Pelvic exam: Kennon Portela present. External: Half centimeter firm area on right labia near introitus which feels indurated without any evidence that there is fluid within it. BUS within normal limits. Vaginal examination: Heavy white discharge. Wet prep positive for amine, clue cells and yeast.cervix is clean.  Assessment: #1. Yeast vaginitis #2. Bacterial vaginosis #3. Induration in right labia due to a vaginal infection #4. Dermatologists feels she has excessive testosterone.  Plan: Diflucan 200 mg daily for 4 days. MetroGel vaginal cream at bedtime in the vagina for 5 days. Warm soaks to labia. If lesion persists return for excision. Free and total testosterone levels drawn.

## 2010-12-17 LAB — TESTOSTERONE, FREE, TOTAL, SHBG
Sex Hormone Binding: 50 nmol/L (ref 18–114)
Testosterone: 44.32 ng/dL (ref 10–70)

## 2011-02-04 ENCOUNTER — Telehealth: Payer: Self-pay | Admitting: *Deleted

## 2011-02-04 DIAGNOSIS — N63 Unspecified lump in unspecified breast: Secondary | ICD-10-CM

## 2011-02-04 DIAGNOSIS — N83209 Unspecified ovarian cyst, unspecified side: Secondary | ICD-10-CM

## 2011-02-04 NOTE — Telephone Encounter (Signed)
Pt said this pain in her ovary that she has had in past. She would like a Rx, OTC do not work. Pt said that you gave her tylox before to help with pain. Please advise

## 2011-02-04 NOTE — Telephone Encounter (Signed)
L/M FOR PT TO CALL.

## 2011-02-04 NOTE — Telephone Encounter (Signed)
Pt is calling c/o left breast lump that is tender, pt wants to know if you want her to make appointment or if would just write order for diag. Mammo.? She also would like rx for percocet for her fibroid cyst that has ruptured, pt was told if this happens to call and get percocet rx. Please advise.

## 2011-02-04 NOTE — Telephone Encounter (Signed)
Tell her I am uncomfortable treating her with something that strong without seeing her. It could be a new problem. If she would like that she would need to come in any way for written prescription as it cannot be called in.  strongly suggest ultrasound with office visit.

## 2011-02-04 NOTE — Telephone Encounter (Signed)
Schedule her for a diagnostic mammogram and ultrasound of left breast at [facility]. I am not sure what she means by a fibroid cyst having ruptured. The fibroids are solid and they do not rupture. Is she talking about pain on her ovary. As she tried with something less strong and Percocet. If she thinks she needs something as strong as Percocet I think she needs an office visit with an ultrasound.

## 2011-02-04 NOTE — Telephone Encounter (Signed)
PT INFORMED WITH THE BELOW NOTE, PT TRANSFERRED TO APPOINTMENT DESK.  

## 2011-02-05 ENCOUNTER — Other Ambulatory Visit: Payer: Medicare Other

## 2011-02-05 ENCOUNTER — Ambulatory Visit: Payer: Medicare Other | Admitting: Obstetrics and Gynecology

## 2011-02-13 ENCOUNTER — Telehealth: Payer: Self-pay | Admitting: *Deleted

## 2011-02-13 NOTE — Telephone Encounter (Signed)
Pt called and said she never received a call regarding her appointment with breast center. I placed order on 1/22. Called breast center and spoke with Victorino Dike and asked her should I call to schedule? She stated you don't have to you can just place order. Breast center tried to contact pt and pt never called back. Appointment schedule for Feb 7 8:00, pt given number to reschedule if needed.

## 2011-02-20 ENCOUNTER — Other Ambulatory Visit: Payer: Medicare Other

## 2011-02-21 ENCOUNTER — Ambulatory Visit
Admission: RE | Admit: 2011-02-21 | Discharge: 2011-02-21 | Disposition: A | Discharge status: Home / Self Care | Payer: Medicare Other | Source: Ambulatory Visit | Attending: Obstetrics and Gynecology | Admitting: Obstetrics and Gynecology

## 2011-02-21 DIAGNOSIS — N63 Unspecified lump in unspecified breast: Secondary | ICD-10-CM | POA: Diagnosis not present

## 2011-03-18 ENCOUNTER — Encounter: Payer: Medicare Other | Admitting: Obstetrics and Gynecology

## 2011-04-01 ENCOUNTER — Ambulatory Visit (INDEPENDENT_AMBULATORY_CARE_PROVIDER_SITE_OTHER): Payer: Medicare Other | Admitting: Obstetrics and Gynecology

## 2011-04-01 ENCOUNTER — Encounter: Payer: Self-pay | Admitting: Obstetrics and Gynecology

## 2011-04-01 VITALS — BP 110/60 | Ht 63.0 in | Wt 109.0 lb

## 2011-04-01 DIAGNOSIS — N898 Other specified noninflammatory disorders of vagina: Secondary | ICD-10-CM

## 2011-04-01 DIAGNOSIS — R8290 Unspecified abnormal findings in urine: Secondary | ICD-10-CM

## 2011-04-01 DIAGNOSIS — R82998 Other abnormal findings in urine: Secondary | ICD-10-CM

## 2011-04-01 DIAGNOSIS — N63 Unspecified lump in unspecified breast: Secondary | ICD-10-CM | POA: Diagnosis not present

## 2011-04-01 DIAGNOSIS — R102 Pelvic and perineal pain: Secondary | ICD-10-CM

## 2011-04-01 DIAGNOSIS — N949 Unspecified condition associated with female genital organs and menstrual cycle: Secondary | ICD-10-CM | POA: Diagnosis not present

## 2011-04-01 DIAGNOSIS — F419 Anxiety disorder, unspecified: Secondary | ICD-10-CM | POA: Insufficient documentation

## 2011-04-01 LAB — WET PREP FOR TRICH, YEAST, CLUE

## 2011-04-01 MED ORDER — FLUCONAZOLE 200 MG PO TABS: 200.0000 mg | ORAL_TABLET | Freq: Every day | ORAL | Status: AC

## 2011-04-01 NOTE — Progress Notes (Signed)
Patient came to see me today for further followup. She continues to have intermittent pelvic pain. It's not always cycle related. It seems to come and go. It is associated with severe early heavy periods. She does not contracept and has not for 5-10 years with several partners who have children and yet she has not gotten pregnant. She is currently getting ready to marry someone who has no children but just had  a normal semen analysis. Unfortunately he will be in prison but they do allow conjugal  Visits. She's noticed a lump in her left breast that she wanted me to check. We have already sent her for mammogram and ultrasound and they thought this was fibrocystic change. We have treated her for yeast infection with one Diflucan and terconazole 7 cream but she had to stop the cream due to a rash. She still thinks she has the infection. The patient has never been treated for any STD.  ROS: System review done. Pertinent positives above. Other positives include depression, nicotine addiction, myotonic muscular dystrophy, and myalgia/myositis.  Physical examination: Kennon Portela present HEENT within normal limits. Neck: Thyroid not large. No masses. Supraclavicular nodes: not enlarged. Breasts: Examined in both sitting and lying  position. No skin changes and no dominant masses. Patient lumpy in her left upper outer quadrant breast. Abdomen: Soft no guarding rebound or masses or hernia. Pelvic: External: Within normal limits. BUS: Within normal limits. Vaginal:within normal limits. Good estrogen effect. No evidence of cystocele rectocele or enterocele. Cervix: clean. Uterus: Normal size and shape. Adnexa: No masses. Rectovaginal exam: Confirmatory and negative. Extremities: Within normal limits. Wet prep negative but patient's discharge looks like yeast.  Assessment: #1. Pelvic pain with endometriosis strongly suspected #2. Fibrocystic breast disease #3. Yeast vaginitis  Plan: Diflucan 200 mg daily for 7  days. Pelvic ultrasound. Reassured regarding breast. Laparoscopy scheduled for pelvic pain and suspected endometriosis.

## 2011-04-03 ENCOUNTER — Telehealth: Payer: Self-pay

## 2011-04-03 NOTE — Telephone Encounter (Signed)
Left message patient to call me. 

## 2011-04-04 ENCOUNTER — Ambulatory Visit (INDEPENDENT_AMBULATORY_CARE_PROVIDER_SITE_OTHER): Payer: Medicare Other | Admitting: Obstetrics and Gynecology

## 2011-04-04 ENCOUNTER — Telehealth: Payer: Self-pay

## 2011-04-04 ENCOUNTER — Ambulatory Visit (INDEPENDENT_AMBULATORY_CARE_PROVIDER_SITE_OTHER): Payer: Medicare Other

## 2011-04-04 DIAGNOSIS — R102 Pelvic and perineal pain: Secondary | ICD-10-CM

## 2011-04-04 DIAGNOSIS — N949 Unspecified condition associated with female genital organs and menstrual cycle: Secondary | ICD-10-CM | POA: Diagnosis not present

## 2011-04-04 DIAGNOSIS — N83209 Unspecified ovarian cyst, unspecified side: Secondary | ICD-10-CM

## 2011-04-04 NOTE — Progress Notes (Signed)
Patient came back today for an ultrasound with a history of pelvic pain and previous ovarian cysts. On ultrasound her uterus appears normal. On pelvic exam we felt nodularity behind the uterus. Her endometrial echo is 5.5 mm. Her right ovary is normal. The left ovary shows a thick walled echo for a vascular mass of 1.1 cm. Her cul-de-sac is free of fluid.  Assessment: Pelvic pain with endometriosis suspected. Left adnexal mass probably functional.  Plan: We will proceed with laparoscopy. We will treat the left mass if appropriate at that time.

## 2011-04-04 NOTE — Telephone Encounter (Signed)
I spoke with patient and informed her surgery scheduled for Friday, April 5th at 7:30am at [facility].  Instructions provided and pamphlet mailed to patient.

## 2011-04-08 ENCOUNTER — Encounter (HOSPITAL_BASED_OUTPATIENT_CLINIC_OR_DEPARTMENT_OTHER): Payer: Self-pay | Admitting: *Deleted

## 2011-04-09 ENCOUNTER — Encounter (HOSPITAL_BASED_OUTPATIENT_CLINIC_OR_DEPARTMENT_OTHER): Payer: Self-pay | Admitting: *Deleted

## 2011-04-09 NOTE — Progress Notes (Signed)
NPO AFTER MN. ARRIVES AT 0615.  CBC AND SERUM TO BE DONE AT [facility]. WILL TAKE VALIUM AM OF SURG W/ SIP OF WATER.

## 2011-04-11 DIAGNOSIS — H9209 Otalgia, unspecified ear: Secondary | ICD-10-CM | POA: Diagnosis not present

## 2011-04-11 DIAGNOSIS — H60399 Other infective otitis externa, unspecified ear: Secondary | ICD-10-CM | POA: Diagnosis not present

## 2011-04-14 ENCOUNTER — Encounter: Payer: Self-pay | Admitting: Gynecology

## 2011-04-14 ENCOUNTER — Telehealth: Payer: Self-pay

## 2011-04-14 ENCOUNTER — Ambulatory Visit (INDEPENDENT_AMBULATORY_CARE_PROVIDER_SITE_OTHER): Payer: Medicare Other | Admitting: Gynecology

## 2011-04-14 VITALS — BP 102/66 | Temp 97.4°F

## 2011-04-14 DIAGNOSIS — H6690 Otitis media, unspecified, unspecified ear: Secondary | ICD-10-CM

## 2011-04-14 DIAGNOSIS — J029 Acute pharyngitis, unspecified: Secondary | ICD-10-CM | POA: Diagnosis not present

## 2011-04-14 DIAGNOSIS — H60399 Other infective otitis externa, unspecified ear: Secondary | ICD-10-CM | POA: Diagnosis not present

## 2011-04-14 DIAGNOSIS — R599 Enlarged lymph nodes, unspecified: Secondary | ICD-10-CM | POA: Diagnosis not present

## 2011-04-14 NOTE — Telephone Encounter (Signed)
[facility] Triage Call Report Triage Record Num: [id number] Operator: Freddie Breech Patient Name: Valerie Friedman Call Date & Time: 04/11/2011 11:00:27AM Patient Phone: [phone] PCP: Tera Mater. Clent Ridges Patient Gender: Female PCP Fax : [phone] Patient DOB: 1975/01/19 Practice Name: [facility] Reason for Call: Caller: Avaiyah/Patient; PCP: Other; CB#: [phone]; Call regarding Right Ear Pain. Severe ear pain onset this AM. Swelling behind her ear. Advised UC in 4 hrs per Ear Sx Protocol. Protocol(s) Used: Ear: Symptoms Recommended Outcome per Protocol: See Provider within 4 hours Reason for Outcome: Severe pain (sharp, stabbing, throbbing or excruciating aching) unresponsive to 24 hours of home care Care Advice: A warm washcloth or heating pad set on low to the affected ear may help relieve the discomfort. May apply for 15 to 20 minutes, 3 to 4 times a day. ~ Keep ear canal as dry as possible. Take a bath instead of showering. Try to keep water out of ear when washing hair by placing cotton balls in ear opening. Avoid swimming or water sports until okayed by provider. ~ Analgesic/Antipyretic Advice - Acetaminophen: Consider acetaminophen as directed on label or by pharmacist/provider for pain or fever PRECAUTIONS: - Use if there is no history of liver disease, alcoholism, or intake of three or more alcohol drinks per day - Only if approved by provider during pregnancy or when breastfeeding - During pregnancy, acetaminophen should not be taken more than 3 consecutive days without telling provider - Do not exceed recommended dose or frequency ~ 04/11/2011 11:10:51AM Page 1 of 1 [id number]

## 2011-04-14 NOTE — Patient Instructions (Signed)
Otitis Media with Effusion  Otitis media with effusion is the presence of fluid in the middle ear. This is a common problem that often follows ear infections. It may be present for weeks or longer after the infection. Unlike an acute ear infection, otits media with effusion refers only to fluid behind the ear drum and not infection. Children with repeated ear and sinus infections and allergy problems are the most likely to get otitis media with effusion.  CAUSES   The most frequent cause of the fluid buildup is dysfunction of the eustacian tubes. These are the tubes that drain fluid in the ears to the throat.  SYMPTOMS    The main symptom of this condition is hearing loss. As a result, you or your child may:   Listen to the TV at a loud volume.   Not respond to questions.   Ask "what" often when spoken to.   There may be a sensation of fullness or pressure but usually not pain.  DIAGNOSIS    Your caregiver will diagnose this condition by examining you or your child's ears.   Your caregiver may test the pressure in you or your child's ear with a tympanometer.   A hearing test may be conducted if the problem persists.   A caregiver will want to re-evaluate the condition periodically to see if it improves.  TREATMENT    Treatment depends on the duration and the effects of the effusion.   Antibiotics, decongestants, nose drops, and cortisone-type drugs may not be helpful.   Children with persistent ear effusions may have delayed language. Children at risk for developmental delays in hearing, learning, and speech may require referral to a specialist earlier than children not at risk.   You or your child's caregiver may suggest a referral to an Ear, Nose, and Throat (ENT) surgeon for treatment. The following may help restore normal hearing:   Drainage of fluid.   Placement of ear tubes (tympanostomy tubes).   Removal of adenoids (adenoidectomy).  HOME CARE INSTRUCTIONS    Avoid second hand  smoke.   Infants who are breast fed are less likely to have this condition.   Avoid feeding infants while laying flat.   Avoid known environmental allergens.   Be sure to see a caregiver or an ENT specialist for follow up.   Avoid people who are sick.  SEEK MEDICAL CARE IF:    Hearing is not better in 3 months.   Hearing is worse.   Ear pain.   Drainage from the ear.   Dizziness.  Document Released: 02/07/2004 Document Revised: 12/19/2010 Document Reviewed: 05/22/2009  ExitCare Patient Information 2012 [employer].

## 2011-04-14 NOTE — Progress Notes (Signed)
36 her will patient presented to the office today with continued pressure on her right year as well as painful swallowing and swollen lymph nodes her neck. She had been to an urgent care recently and she's on the fourth day of Z-Pak with minimal improvement. She denied any fever and her temperature today was 97.4. She denied any coughing or any sinus congestion.  Exam: HEENT: Left ear: TMs clear with good light reflex small amount of wax external ear canal Right ear: TM not clear possible effusion some wax external ear canal no erythema Bilateral submandibular lymphadenopathy/tender Oropharynx: Hyperemic but no peritonsillar plaques or crypts. Strep throat culture was obtained  Patient will be referred to the ENT who has seen her last year for further evaluation of her unresponsive otitis with the possibility of mastoiditis. He may need to change her antibiotic and or place her on steroid pack as well as consideration of CT. Arrangements have been made for her to be seen this afternoon at 1 PM. She is scheduled for elective surgery this Friday which may need to be postponed by Dr. Eda Paschal.

## 2011-04-15 ENCOUNTER — Telehealth: Payer: Self-pay

## 2011-04-15 ENCOUNTER — Ambulatory Visit: Payer: Medicare Other | Admitting: Family

## 2011-04-15 NOTE — Telephone Encounter (Signed)
Patient advised. York Spaniel she will not be seeing her partner until May so pregnancy will not be a concern.

## 2011-04-15 NOTE — Telephone Encounter (Signed)
Patient's mother called to see if I had cancelled her daughter's surgery.  I told her I had planned to check with her tomorrow and asked had she mentioned that she was having surgery to ENT. Mom said she did and doctor did not think surgery on Friday was a good idea. She put her on Clindamycin for 10 days.  Mom put daughter on the phone with me and I confirmed with her that she is not feeling well today and would like to reschedule her surgery.  We rescheduled her to Thursday, April 18th in hopes that she will be well by then.  She said she is to follow up with ENT by Friday if not better. She will let us know if she doesn't get well as planned and needs to reschedule again.

## 2011-04-15 NOTE — Telephone Encounter (Signed)
That is fine. She needs to use condoms so we know she is not pregnant on the day of surgery.

## 2011-04-28 ENCOUNTER — Encounter (HOSPITAL_BASED_OUTPATIENT_CLINIC_OR_DEPARTMENT_OTHER): Payer: Self-pay | Admitting: *Deleted

## 2011-04-28 NOTE — Progress Notes (Signed)
NPO AFTER MN. ARRIVES AT 0600. NEEDS CBC AND SERUM PREG. WILL TAKE VALIUM AM OF SURG. W/ SIP OF WATER.

## 2011-04-29 ENCOUNTER — Telehealth: Payer: Self-pay

## 2011-04-29 NOTE — Telephone Encounter (Signed)
Patient called because she was concerned that [facility] told her that she did not have to go have labwork ahead of time and that it would be done morning of surgery.  I explained that [facility] is outpatient facility and everything very streamlined. I assured her if Dr. Reece Agar or anesthesiologist had ordered labs that needed to be drawn ahead of time that they would have sent her to [facility] lab to have it drawn in advance.  I reassured her fine for labs morning of.  Patient wanted me to let Dr. Reece Agar know that she considers herself allergic to Percocet in that it only takes a matter of days for her body to become addicted to it.  She said she previously took Tylox and it helped and she did well on it.  She would like to get Tylox Rx for pain after surgery.

## 2011-04-29 NOTE — H&P (Signed)
  Patient is a 36 year old nulligravida with a long history of pelvic pain. It can be associated with dyspareunia. She has extremely heavy periods. She gets recurrent ovarian cysts. Her most recent ultrasound showed a very vascular cyst on her left ovary of between 1 and 2 cm. On exam I can feel nodularity behind her uterus. She has no significant bowel symptoms. She does not bleed between her periods. She enters the hospital now for diagnostic laparoscopy with endometriosis strongly suspected. She will undergo laser excision of any endometriosis found.  Past medical history, family history, social history, and review of systems in epic record and has been reviewed.  Physical exam:Physical examination: HEENT within normal limits. Neck: Thyroid not large. No masses. Supraclavicular nodes: not enlarged. Breasts: Examined in both sitting and lying  position. No skin changes and no masses. Abdomen: Soft no guarding rebound or masses or hernia. Pelvic: External: Within normal limits. BUS: Within normal limits. Vaginal:within normal limits. Good estrogen effect. No evidence of cystocele rectocele or enterocele. Cervix: clean. Uterus: Normal size and shape with nodularity behind the uterus.  Adnexa: No masses. Rectovaginal exam: Confirmatory and negative. Extremities: Within normal limits.  Assessment: Menorrhagia. Pelvic pain. Nodularity of the uterosacral ligaments. Small left ovarian cyst. Endometriosis strongly suspected.  Plan: Diagnostic laparoscopy with laser if appropriate.

## 2011-04-30 NOTE — Telephone Encounter (Signed)
Routed to Dr. G.  

## 2011-05-01 ENCOUNTER — Ambulatory Visit (HOSPITAL_BASED_OUTPATIENT_CLINIC_OR_DEPARTMENT_OTHER)
Admission: RE | Admit: 2011-05-01 | Discharge: 2011-05-01 | Disposition: A | Discharge status: Home / Self Care | Payer: Medicare Other | Source: Ambulatory Visit | Attending: Obstetrics and Gynecology | Admitting: Obstetrics and Gynecology

## 2011-05-01 ENCOUNTER — Encounter (HOSPITAL_BASED_OUTPATIENT_CLINIC_OR_DEPARTMENT_OTHER): Payer: Self-pay | Admitting: Anesthesiology

## 2011-05-01 ENCOUNTER — Encounter (HOSPITAL_BASED_OUTPATIENT_CLINIC_OR_DEPARTMENT_OTHER): Payer: Self-pay | Admitting: *Deleted

## 2011-05-01 ENCOUNTER — Encounter (HOSPITAL_BASED_OUTPATIENT_CLINIC_OR_DEPARTMENT_OTHER)
Admission: RE | Disposition: A | Discharge status: Home / Self Care | Payer: Self-pay | Source: Ambulatory Visit | Attending: Obstetrics and Gynecology

## 2011-05-01 ENCOUNTER — Ambulatory Visit (HOSPITAL_BASED_OUTPATIENT_CLINIC_OR_DEPARTMENT_OTHER): Payer: Medicare Other | Admitting: Anesthesiology

## 2011-05-01 DIAGNOSIS — N736 Female pelvic peritoneal adhesions (postinfective): Secondary | ICD-10-CM | POA: Insufficient documentation

## 2011-05-01 DIAGNOSIS — Q504 Embryonic cyst of fallopian tube: Secondary | ICD-10-CM | POA: Insufficient documentation

## 2011-05-01 DIAGNOSIS — N920 Excessive and frequent menstruation with regular cycle: Secondary | ICD-10-CM | POA: Insufficient documentation

## 2011-05-01 DIAGNOSIS — N803 Endometriosis of pelvic peritoneum, unspecified: Secondary | ICD-10-CM | POA: Insufficient documentation

## 2011-05-01 DIAGNOSIS — N83209 Unspecified ovarian cyst, unspecified side: Secondary | ICD-10-CM | POA: Insufficient documentation

## 2011-05-01 DIAGNOSIS — N949 Unspecified condition associated with female genital organs and menstrual cycle: Secondary | ICD-10-CM | POA: Diagnosis not present

## 2011-05-01 DIAGNOSIS — N838 Other noninflammatory disorders of ovary, fallopian tube and broad ligament: Secondary | ICD-10-CM | POA: Diagnosis not present

## 2011-05-01 DIAGNOSIS — N809 Endometriosis, unspecified: Secondary | ICD-10-CM | POA: Diagnosis not present

## 2011-05-01 DIAGNOSIS — N800 Endometriosis of uterus: Secondary | ICD-10-CM

## 2011-05-01 HISTORY — DX: Low back pain: M54.5

## 2011-05-01 HISTORY — DX: Low back pain, unspecified: M54.50

## 2011-05-01 HISTORY — DX: Other chronic pain: G89.29

## 2011-05-01 HISTORY — DX: Pelvic and perineal pain unspecified side: R10.20

## 2011-05-01 HISTORY — DX: Anesthesia of skin: R20.0

## 2011-05-01 HISTORY — PX: LAPAROSCOPY: SHX197

## 2011-05-01 HISTORY — DX: Pelvic and perineal pain: R10.2

## 2011-05-01 LAB — CBC
MCV: 92 fL (ref 78.0–100.0)
Platelets: 194 10*3/uL (ref 150–400)
RBC: 4.37 MIL/uL (ref 3.87–5.11)
RDW: 12.5 % (ref 11.5–15.5)
WBC: 7.7 10*3/uL (ref 4.0–10.5)

## 2011-05-01 LAB — HCG, SERUM, QUALITATIVE: Preg, Serum: NEGATIVE

## 2011-05-01 SURGERY — LAPAROSCOPY, DIAGNOSTIC
Anesthesia: General | Site: Uterus | Wound class: Clean

## 2011-05-01 MED ORDER — PROPOFOL 10 MG/ML IV EMUL
INTRAVENOUS | Status: DC | PRN
  Administered 2011-05-01: 160 mg via INTRAVENOUS

## 2011-05-01 MED ORDER — FENTANYL CITRATE 0.05 MG/ML IJ SOLN
INTRAMUSCULAR | Status: DC | PRN
  Administered 2011-05-01 (×3): 25 ug via INTRAVENOUS
  Administered 2011-05-01: 50 ug via INTRAVENOUS

## 2011-05-01 MED ORDER — NEOSTIGMINE METHYLSULFATE 1 MG/ML IJ SOLN
INTRAMUSCULAR | Status: DC | PRN
  Administered 2011-05-01: 4 mg via INTRAVENOUS

## 2011-05-01 MED ORDER — ROCURONIUM BROMIDE 100 MG/10ML IV SOLN
INTRAVENOUS | Status: DC | PRN
  Administered 2011-05-01: 30 mg via INTRAVENOUS

## 2011-05-01 MED ORDER — FENTANYL CITRATE 0.05 MG/ML IJ SOLN
25.0000 ug | INTRAMUSCULAR | Status: DC | PRN
  Administered 2011-05-01 (×2): 25 ug via INTRAVENOUS
  Administered 2011-05-01: 100 ug via INTRAVENOUS
  Administered 2011-05-01: 25 ug via INTRAVENOUS

## 2011-05-01 MED ORDER — MIDAZOLAM HCL 5 MG/5ML IJ SOLN
INTRAMUSCULAR | Status: DC | PRN
  Administered 2011-05-01: 1 mg via INTRAVENOUS

## 2011-05-01 MED ORDER — GLYCOPYRROLATE 0.2 MG/ML IJ SOLN
INTRAMUSCULAR | Status: DC | PRN
  Administered 2011-05-01: 0.6 mg via INTRAVENOUS

## 2011-05-01 MED ORDER — ONDANSETRON HCL 4 MG/2ML IJ SOLN
INTRAMUSCULAR | Status: DC | PRN
  Administered 2011-05-01: 4 mg via INTRAVENOUS

## 2011-05-01 MED ORDER — MIDAZOLAM HCL 2 MG/2ML IJ SOLN: 2.0000 mg | Freq: Once | INTRAMUSCULAR | Status: DC

## 2011-05-01 MED ORDER — LACTATED RINGERS IR SOLN
Status: DC | PRN
  Administered 2011-05-01: 3000 mL

## 2011-05-01 MED ORDER — PROMETHAZINE HCL 25 MG/ML IJ SOLN: 6.2500 mg | INTRAMUSCULAR | Status: DC | PRN

## 2011-05-01 MED ORDER — INDIGOTINDISULFONATE SODIUM 8 MG/ML IJ SOLN
INTRAMUSCULAR | Status: DC | PRN
  Administered 2011-05-01: 5 mL

## 2011-05-01 MED ORDER — EPHEDRINE SULFATE 50 MG/ML IJ SOLN
INTRAMUSCULAR | Status: DC | PRN
  Administered 2011-05-01: 10 mg via INTRAVENOUS

## 2011-05-01 MED ORDER — CEFAZOLIN SODIUM 1-5 GM-% IV SOLN
1.0000 g | INTRAVENOUS | Status: AC
  Administered 2011-05-01: 1 g via INTRAVENOUS

## 2011-05-01 MED ORDER — KETOROLAC TROMETHAMINE 30 MG/ML IJ SOLN
INTRAMUSCULAR | Status: DC | PRN
  Administered 2011-05-01: 30 mg via INTRAVENOUS

## 2011-05-01 MED ORDER — HYDROMORPHONE HCL PF 1 MG/ML IJ SOLN
0.5000 mg | INTRAMUSCULAR | Status: DC | PRN
  Administered 2011-05-01: 0.25 mg via INTRAVENOUS
  Administered 2011-05-01: 0.5 mg via INTRAVENOUS

## 2011-05-01 MED ORDER — DEXAMETHASONE SODIUM PHOSPHATE 4 MG/ML IJ SOLN
INTRAMUSCULAR | Status: DC | PRN
  Administered 2011-05-01: 10 mg via INTRAVENOUS

## 2011-05-01 MED ORDER — LIDOCAINE HCL (CARDIAC) 20 MG/ML IV SOLN
INTRAVENOUS | Status: DC | PRN
  Administered 2011-05-01: 50 mg via INTRAVENOUS

## 2011-05-01 MED ORDER — LACTATED RINGERS IV SOLN
INTRAVENOUS | Status: DC
  Administered 2011-05-01 (×3): via INTRAVENOUS

## 2011-05-01 MED ORDER — FENTANYL CITRATE 0.05 MG/ML IJ SOLN: 100.0000 ug | Freq: Once | INTRAMUSCULAR | Status: DC

## 2011-05-01 SURGICAL SUPPLY — 64 items
ADH SKN CLS APL DERMABOND .7 (GAUZE/BANDAGES/DRESSINGS)
APL SKNCLS STERI-STRIP NONHPOA (GAUZE/BANDAGES/DRESSINGS) ×2
APPLICATOR COTTON TIP 6IN STRL (MISCELLANEOUS) ×3 IMPLANT
BAG SPEC RTRVL LRG 6X4 10 (ENDOMECHANICALS)
BAG URINE DRAINAGE (UROLOGICAL SUPPLIES) ×1 IMPLANT
BANDAGE ADHESIVE 1X3 (GAUZE/BANDAGES/DRESSINGS) IMPLANT
BENZOIN TINCTURE PRP APPL 2/3 (GAUZE/BANDAGES/DRESSINGS) ×3 IMPLANT
BLADE SURG 11 STRL SS (BLADE) ×3 IMPLANT
CANISTER SUCTION 1200CC (MISCELLANEOUS) IMPLANT
CANISTER SUCTION 2500CC (MISCELLANEOUS) IMPLANT
CATH ROBINSON RED A/P 16FR (CATHETERS) IMPLANT
CATH SILICONE 16FRX5CC (CATHETERS) ×1 IMPLANT
CLOTH BEACON ORANGE TIMEOUT ST (SAFETY) ×3 IMPLANT
DERMABOND ADVANCED (GAUZE/BANDAGES/DRESSINGS)
DERMABOND ADVANCED .7 DNX12 (GAUZE/BANDAGES/DRESSINGS) IMPLANT
DRAPE CAMERA CLOSED 9X96 (DRAPES) ×3 IMPLANT
DRAPE UNDERBUTTOCKS STRL (DRAPE) ×3 IMPLANT
DRESSING TELFA 8X3 (GAUZE/BANDAGES/DRESSINGS) IMPLANT
ELECT REM PT RETURN 9FT ADLT (ELECTROSURGICAL) ×3
ELECTRODE REM PT RTRN 9FT ADLT (ELECTROSURGICAL) ×2 IMPLANT
FILTER SMOKE EVAC LAPAROSHD (FILTER) ×1 IMPLANT
FORCEPS BIOP CUTTING (INSTRUMENTS) IMPLANT
GAS CARTRIDGE (MEDICAL GASES) ×1 IMPLANT
GLOVE ECLIPSE 7.0 STRL STRAW (GLOVE) ×4 IMPLANT
GLOVE INDICATOR 7.0 STRL GRN (GLOVE) ×1 IMPLANT
GLOVE INDICATOR 7.5 STRL GRN (GLOVE) ×3 IMPLANT
GLOVE SKINSENSE NS SZ6.5 (GLOVE) ×2
GLOVE SKINSENSE NS SZ7.0 (GLOVE) ×2
GLOVE SKINSENSE STRL SZ6.5 (GLOVE) IMPLANT
GLOVE SKINSENSE STRL SZ7.0 (GLOVE) IMPLANT
GOWN PREVENTION PLUS LG XLONG (DISPOSABLE) ×5 IMPLANT
LAPAROSCOPY HANDPIECE LONG (MISCELLANEOUS) ×1 IMPLANT
LIGASURE 5MM LAPAROSCOPIC (INSTRUMENTS) IMPLANT
PACK BASIN DAY SURGERY FS (CUSTOM PROCEDURE TRAY) ×3 IMPLANT
PACK LAPAROSCOPY II (CUSTOM PROCEDURE TRAY) ×3 IMPLANT
PAD OB MATERNITY 4.3X12.25 (PERSONAL CARE ITEMS) ×3 IMPLANT
PAD PREP 24X48 CUFFED NSTRL (MISCELLANEOUS) ×3 IMPLANT
POUCH SPECIMEN RETRIEVAL 10MM (ENDOMECHANICALS) IMPLANT
SCALPEL HARMONIC ACE (MISCELLANEOUS) IMPLANT
SCISSORS LAP 5X35 DISP (ENDOMECHANICALS) IMPLANT
SET IRRIG TUBING LAPAROSCOPIC (IRRIGATION / IRRIGATOR) ×1 IMPLANT
SOLUTION ANTI FOG 6CC (MISCELLANEOUS) ×3 IMPLANT
SOLUTION ELECTROLUBE (MISCELLANEOUS) IMPLANT
SPONGE GAUZE 2X2 8PLY STRL LF (GAUZE/BANDAGES/DRESSINGS) ×1 IMPLANT
STRIP CLOSURE SKIN 1/2X4 (GAUZE/BANDAGES/DRESSINGS) ×1 IMPLANT
STRIP CLOSURE SKIN 1/4X4 (GAUZE/BANDAGES/DRESSINGS) IMPLANT
SUT MNCRL AB 3-0 PS2 18 (SUTURE) IMPLANT
SUT MON AB 3-0 SH 27 (SUTURE) ×3 IMPLANT
SUT VICRYL 0 UR6 27IN ABS (SUTURE) ×6 IMPLANT
SYR 3ML 23GX1 SAFETY (SYRINGE) IMPLANT
SYRINGE 10CC LL (SYRINGE) ×1 IMPLANT
TAPE PAPER 2X10 WHT MICROPORE (GAUZE/BANDAGES/DRESSINGS) ×1 IMPLANT
TOWEL OR 17X24 6PK STRL BLUE (TOWEL DISPOSABLE) ×6 IMPLANT
TRAY DSU PREP LF (CUSTOM PROCEDURE TRAY) ×3 IMPLANT
TROCAR 12M 150ML BLUNT (TROCAR) IMPLANT
TROCAR 5M 150ML BLDLS (TROCAR) IMPLANT
TROCAR BLADED SMOOTH C0639 (ENDOMECHANICALS) IMPLANT
TROCAR CANNULA 5X100MM C0Q10 (TROCAR) IMPLANT
TROCAR XCEL BLUNT TIP 100MML (ENDOMECHANICALS) IMPLANT
TROCAR XCEL NON-BLD 11X100MML (ENDOMECHANICALS) ×3 IMPLANT
TROCAR Z-THREAD FIOS 5X100MM (TROCAR) ×4 IMPLANT
TUBING INSUFFLATION W/FILTER (TUBING) ×3 IMPLANT
VACUUM HOSE/TUBING 7/8INX6FT (MISCELLANEOUS) IMPLANT
WATER STERILE IRR 500ML POUR (IV SOLUTION) ×3 IMPLANT

## 2011-05-01 NOTE — Anesthesia Preprocedure Evaluation (Signed)
Anesthesia Evaluation  Patient identified by MRN, date of birth, ID band Patient awake    Reviewed: Allergy & Precautions, H&P , NPO status , Patient's Chart, lab work & pertinent test results  Airway Mallampati: II TM Distance: >3 FB Neck ROM: Full    Dental No notable dental hx.    Pulmonary former smoker breath sounds clear to auscultation  Pulmonary exam normal       Cardiovascular negative cardio ROS  Rhythm:Regular Rate:Normal     Neuro/Psych  Headaches, PSYCHIATRIC DISORDERS Anxiety Depression Myotonic muscular dystrophy. Myalgia/myositis  Neuromuscular disease    GI/Hepatic negative GI ROS, Neg liver ROS,   Endo/Other  negative endocrine ROS  Renal/GU negative Renal ROS  negative genitourinary   Musculoskeletal  (+) Fibromyalgia -, narcotic dependent  Abdominal   Peds negative pediatric ROS (+)  Hematology negative hematology ROS (+)   Anesthesia Other Findings   Reproductive/Obstetrics negative OB ROS                           Anesthesia Physical Anesthesia Plan  ASA: II  Anesthesia Plan: General   Post-op Pain Management:    Induction: Intravenous  Airway Management Planned: Oral ETT  Additional Equipment:   Intra-op Plan:   Post-operative Plan: Extubation in OR  Informed Consent: I have reviewed the patients History and Physical, chart, labs and discussed the procedure including the risks, benefits and alternatives for the proposed anesthesia with the patient or authorized representative who has indicated his/her understanding and acceptance.   Dental advisory given  Plan Discussed with: CRNA  Anesthesia Plan Comments:         Anesthesia Quick Evaluation

## 2011-05-01 NOTE — Anesthesia Procedure Notes (Signed)
Procedure Name: Intubation Date/Time: 05/01/2011 7:37 AM Performed by: Fran Lowes Pre-anesthesia Checklist: Patient identified, Emergency Drugs available, Suction available and Patient being monitored Patient Re-evaluated:Patient Re-evaluated prior to inductionOxygen Delivery Method: Circle System Utilized Preoxygenation: Pre-oxygenation with 100% oxygen Intubation Type: IV induction Ventilation: Mask ventilation without difficulty Laryngoscope Size: Mac and 3 Grade View: Grade I Tube type: Oral Tube size: 7.0 mm Number of attempts: 1 Airway Equipment and Method: stylet,  oral airway,  LTA kit utilized and Oral airway Placement Confirmation: ETT inserted through vocal cords under direct vision,  positive ETCO2 and breath sounds checked- equal and bilateral Tube secured with: Tape Dental Injury: Teeth and Oropharynx as per pre-operative assessment

## 2011-05-01 NOTE — Discharge Instructions (Signed)
Wound care: The Band-Aids or dressing which are placed on the skin openings may be removed the day after surgery. The incision should be kept clean and dry. The stitches do not need to be removed. Should the incision become sore, red and swollen after the first week check with your doctor.  Personal hygiene: Shower or tub bathe the day after your procedure. Always wipe from front to back after elimination.  Activity: Do not drive for operate and equipment today. The effects of the anesthesia are still present and drowsiness may result. Rest today, not necessarily flat bed rest, just take it easy. You may resume normal activity in 2-3 days.  Sexual activity: You may resume sexual activity in one week. If your  laparoscopy was for sterilization continue current method of birth control until after her next period.  Diet: Eat a light diet as desired this evening. You may resume a regular  diet tomorrow.  Return to work: 2-3 days or as indicated by her physician.  Gen. Expectations of her surgery: Your  operation will cause vaginal drainage or spotting which may continue for 2-3 days. Mild abdominal discomfort or tenderness is not usual and some shoulder pain may also be noted which can be relieved by lying flat in bed.  Unexpected observations call your doctor if these occur: Persistent or heavy bleeding at incision site. Redness or swelling around the incision. Elevation of temperature greater than 100F.  Call office to set up an appointment in one week.  Post Anesthesia Home Care Instructions  Activity: Get plenty of rest for the remainder of the day. A responsible adult should stay with you for 24 hours following the procedure.  For the next 24 hours, DO NOT: -Drive a car -Advertising copywriter -Drink alcoholic beverages -Take any medication unless instructed by your physician -Make any legal decisions or sign important papers.  Meals: Start with liquid foods such as gelatin or soup.  Progress to regular foods as tolerated. Avoid greasy, spicy, heavy foods. If nausea and/or vomiting occur, drink only clear liquids until the nausea and/or vomiting subsides. Call your physician if vomiting continues.  Special Instructions/Symptoms: Your throat may feel dry or sore from the anesthesia or the breathing tube placed in your throat during surgery. If this causes discomfort, gargle with warm salt water. The discomfort should disappear within 24 hours.

## 2011-05-01 NOTE — Op Note (Signed)
Date of procedure: 05/01/2011 Preoperative diagnosis: Pelvic pain, left ovarian cyst, endometriosis suspected. Postoperative diagnosis: Pelvic pain, functional left ovarian cyst, bilateral hydatid cysts, endometriosis, pelvic adhesive disease. Operation: Diagnostic laparoscopy. Laser excision of endometriosis. Lysis of adhesions. Excision of hydatid  cysts. Aspiration of left ovarian cyst. Chromotubation. Surgeon: Dr. Eda Paschal  Findings: At the time of laparoscopy patient had endometriosis on the vesicouterine fold of peritoneum which was both pigmented and nonpigmented. This area involved was 2 cm. Patient also had endometriosis involving the left uterosacral ligament which was both pigmented and nonpigmented and was associated with adhesive disease between the uterosacral ligament and the pelvic peritoneum. Patient had a functional left ovarian cyst which drained clear fluid. Patient had normal fallopian tubes with luxuriant fimbria. When indigo carmine was introduced they were both patent without fimbrial bridging. Patient had hydatid  Cysts on both fallopian tubes approximately 2 cm each. Both were on pedicles. Patient's right ovary and left ovary had no endometriosis. Patient's uterus appeared slightly enlarged as if she had a small fibroid uterus. Ileocecal junction was identified and her appendix was normal. Upper abdomen was normal.  Procedure: After general endotracheal anesthesia the patient was placed in the dorsolithotomy position and prepped and draped in the usual sterile manner. A Foley catheter was inserted in her bladder. A Jarcho cannula was inserted in the uterus. A subumbilical transverse incision was made and through that the 10 mm operating laparoscope was introduced under direct vision with an Optiview. Atraumatic entry in the peritoneal cavity was obtained. A pneumoperitoneum was created. A 5 mm trocar was placed suprapubically in the midline. Careful inspection was made of the  pelvis and was as noted above. Pictures were taken for documentation. The patient is interested in future fertility and with endometriosis Indigo carmine was introduced confirming tubal patency. The neodymium yak laser was then placed through the operating laparoscope with a G6 tip over the bare fiber. The hydatid cysts were excised at the pedicle and sent to pathology for tissue diagnosis. The vesicouterine area of endometriosis was laser excised. The left ovarian cyst was aspirated and drained clear fluid showing no evidence of endometriosis. Using the laser and hydrodissector  the adhesions involving the left uterosacral ligament were freed up and all endometriosis was laser vaporized. Copious irrigation was done with sterile saline. At the termination of the procedure there was no bleeding and no evidence of active endometriosis. All cul-de-sac fluid was removed. The pneumoperitoneum was evacuated. The Jarcho cannula was removed. The Foley catheter was removed. Urine in the catheter was clear. The subumbilical fascial incision was closed with 0 Vicryl. The skin incision was closed with 3-0 Monocryl. The suprapubic incision was not bleeding and was reapproximated with Steri-Strips. The patient tolerated the procedure well and left the operating room in satisfactory condition.

## 2011-05-01 NOTE — Progress Notes (Signed)
Report received from Marcie RN.

## 2011-05-01 NOTE — Progress Notes (Signed)
Dr Eda Paschal in to see pt & redresses the umbilical dsg. Removes sutures & steri strips prior to dsg change. Pt tolerated well.

## 2011-05-01 NOTE — Interval H&P Note (Signed)
History and Physical Interval Note:  05/01/2011 6:50 AM  Valerie Friedman  has presented today for surgery, with the diagnosis of pelvic pain  The various methods of treatment have been discussed with the patient and family. After consideration of risks, benefits and other options for treatment, the patient has consented to  Procedure(s) (LRB): LAPAROSCOPY DIAGNOSTIC (N/A) YAG LASER APPLICATION (N/A) as a surgical intervention .  The patients' history has been reviewed, patient examined, no change in status, stable for surgery.  I have reviewed the patients' chart and labs.  Questions were answered to the patient's satisfaction.     Jase Reep L   

## 2011-05-01 NOTE — Progress Notes (Signed)
Dr Eda Paschal notified by Lolita Rieger RN of umbilical bleeding & dsg changed X 2. No new order noted

## 2011-05-01 NOTE — Anesthesia Postprocedure Evaluation (Signed)
  Anesthesia Post-op Note  Patient: Valerie Friedman  Procedure(s) Performed: Procedure(s) (LRB): LAPAROSCOPY DIAGNOSTIC (N/A) YAG LASER APPLICATION (N/A) LAPAROSCOPIC LYSIS OF ADHESIONS (N/A)  Patient Location: PACU  Anesthesia Type: General  Level of Consciousness: awake and alert   Airway and Oxygen Therapy: Patient Spontanous Breathing  Post-op Pain: mild  Post-op Assessment: Post-op Vital signs reviewed, Patient's Cardiovascular Status Stable, Respiratory Function Stable, Patent Airway and No signs of Nausea or vomiting  Post-op Vital Signs: stable  Complications: No apparent anesthesia complications

## 2011-05-01 NOTE — Transfer of Care (Signed)
Immediate Anesthesia Transfer of Care Note  Patient: Valerie Friedman  Procedure(s) Performed: Procedure(s) (LRB): LAPAROSCOPY DIAGNOSTIC (N/A) YAG LASER APPLICATION (N/A) LAPAROSCOPIC LYSIS OF ADHESIONS (N/A)  Patient Location: Patient transported to PACU with oxygen via face mask at 4 Liters / Min  Anesthesia Type: General  Level of Consciousness: awake and alert   Airway & Oxygen Therapy: Patient Spontanous Breathing and Patient connected to face mask oxygen  Post-op Assessment: Report given to PACU RN and Post -op Vital signs reviewed and stable  Post vital signs: Reviewed and stable  Dentition: Teeth and oropharynx remain in pre-op condition  Complications: No apparent anesthesia complications

## 2011-05-01 NOTE — Interval H&P Note (Signed)
History and Physical Interval Note:  05/01/2011 6:50 AM  Valerie Friedman  has presented today for surgery, with the diagnosis of pelvic pain  The various methods of treatment have been discussed with the patient and family. After consideration of risks, benefits and other options for treatment, the patient has consented to  Procedure(s) (LRB): LAPAROSCOPY DIAGNOSTIC (N/A) YAG LASER APPLICATION (N/A) as a surgical intervention .  The patients' history has been reviewed, patient examined, no change in status, stable for surgery.  I have reviewed the patients' chart and labs.  Questions were answered to the patient's satisfaction.     Samyak Sackmann L

## 2011-05-02 ENCOUNTER — Encounter (HOSPITAL_BASED_OUTPATIENT_CLINIC_OR_DEPARTMENT_OTHER): Payer: Self-pay | Admitting: Obstetrics and Gynecology

## 2011-05-05 ENCOUNTER — Telehealth: Payer: Self-pay | Admitting: *Deleted

## 2011-05-05 NOTE — Telephone Encounter (Signed)
Benadryl cream. It is over the counter.

## 2011-05-05 NOTE — Telephone Encounter (Signed)
(  PT IS AWARE YOU ARE OUT OF THE OFFICE) PT HAD LAPAROSCOPY 05/01/11 and complaining of incision site itching. Pt would like to know if there is something she could put there to relieve itching? Please advise

## 2011-05-06 NOTE — Telephone Encounter (Signed)
Pt informed with the below note. 

## 2011-05-08 ENCOUNTER — Ambulatory Visit (INDEPENDENT_AMBULATORY_CARE_PROVIDER_SITE_OTHER): Payer: Medicare Other | Admitting: Obstetrics and Gynecology

## 2011-05-08 DIAGNOSIS — N809 Endometriosis, unspecified: Secondary | ICD-10-CM

## 2011-05-08 NOTE — Progress Notes (Signed)
Patient came back today for a postoperative visit. She has made an uneventful recuperation.  Exam: Kennon Portela present: abdomen is soft without guarding. Incisions are healing well. Pelvic exam: External: Within normal limits. BUS within normal limits. Vaginal exam: Within normal limits. Cervix: Clean. Uterus: Within normal limits. Adnexa: Within normal limits. Rectovaginal exam: Within normal limits.   Assessment: Endometriosis  Plan: Discussed in detail findings at surgery with patient and mother. Since she does not want to conceive for one year we discussed Depo-Lupron with add back. Information given. Patient would like to proceed. We will get her approved.

## 2011-05-09 ENCOUNTER — Other Ambulatory Visit: Payer: Self-pay | Admitting: *Deleted

## 2011-05-09 ENCOUNTER — Telehealth: Payer: Self-pay | Admitting: *Deleted

## 2011-05-09 MED ORDER — LEUPROLIDE ACETATE 3.75 MG IM KIT: 3.7500 mg | PACK | INTRAMUSCULAR | Status: AC

## 2011-05-09 NOTE — Telephone Encounter (Signed)
Pt saw you for her post-op visit for laparoscopy and said you mentioned to her that she has enlarged uterus. Pt asked if this is something she should be worried about? And if there is anything that should be done?  Please advise

## 2011-05-09 NOTE — Progress Notes (Signed)
Patient to bring in Depo Lupron to office for injection when picks up from the pharmacy.

## 2011-05-09 NOTE — Telephone Encounter (Signed)
Pt informed with the below note. 

## 2011-05-09 NOTE — Telephone Encounter (Signed)
It is due to endometriosis in the uterus or the small fibroid I told her about. It is nothing for her worry about.

## 2011-05-21 ENCOUNTER — Telehealth: Payer: Self-pay | Admitting: *Deleted

## 2011-05-21 NOTE — Telephone Encounter (Signed)
Does she need to be on period to get the Lupron shot?

## 2011-05-21 NOTE — Telephone Encounter (Signed)
Patient called and said she is late on starting period.  Was due 05/06/11.  No sign of period starting.  Never missed before.  And has not picked up Lupron Depot from pharmacy yet cause she didn't know what was going on with period.  (Not sexually active)

## 2011-05-21 NOTE — Telephone Encounter (Signed)
Provera 10mg m daily for 5 days to bring on period.

## 2011-05-21 NOTE — Telephone Encounter (Signed)
yes

## 2011-05-21 NOTE — Telephone Encounter (Signed)
Lm for patient to call

## 2011-05-22 MED ORDER — MEDROXYPROGESTERONE ACETATE 10 MG PO TABS: 10.0000 mg | ORAL_TABLET | Freq: Every day | ORAL | Status: DC

## 2011-05-22 NOTE — Telephone Encounter (Signed)
Patient informed and rx sent in.

## 2011-05-22 NOTE — Telephone Encounter (Signed)
Addended by: Valeda Malm L on: 05/22/2011 09:25 AM   Modules accepted: Orders

## 2011-06-15 ENCOUNTER — Other Ambulatory Visit: Payer: Self-pay | Admitting: Family Medicine

## 2011-06-17 NOTE — Telephone Encounter (Signed)
Call in #120 with 5 rf 

## 2011-09-03 ENCOUNTER — Ambulatory Visit (INDEPENDENT_AMBULATORY_CARE_PROVIDER_SITE_OTHER): Payer: Medicare Other | Admitting: Obstetrics and Gynecology

## 2011-09-03 DIAGNOSIS — N912 Amenorrhea, unspecified: Secondary | ICD-10-CM

## 2011-09-03 MED ORDER — MEDROXYPROGESTERONE ACETATE 10 MG PO TABS: 10.0000 mg | ORAL_TABLET | Freq: Every day | ORAL | Status: DC

## 2011-09-03 NOTE — Patient Instructions (Signed)
We will call tomorrow with results.

## 2011-09-03 NOTE — Progress Notes (Signed)
Patient came in today because of secondary amenorrhea. This has happened previously. She had a normal cycle in May after her endometriosis surgery. She had a little bit of spotting for 3 days a month later. She is complaining of both flashes and some vaginal dryness.  We discussed the above in detail. FSH, TSH and prolactin ordered. If all is normal Provera 10 mg daily for 5 days. She told  me she has been pain-free since her endometriosis surgery.

## 2011-09-04 LAB — FOLLICLE STIMULATING HORMONE: FSH: 2.7 m[IU]/mL

## 2011-10-21 ENCOUNTER — Ambulatory Visit (INDEPENDENT_AMBULATORY_CARE_PROVIDER_SITE_OTHER): Payer: Medicare Other | Admitting: Family Medicine

## 2011-10-21 ENCOUNTER — Encounter: Payer: Self-pay | Admitting: Family Medicine

## 2011-10-21 VITALS — BP 116/74 | HR 105 | Temp 98.4°F | Wt 114.0 lb

## 2011-10-21 DIAGNOSIS — J329 Chronic sinusitis, unspecified: Secondary | ICD-10-CM | POA: Diagnosis not present

## 2011-10-21 MED ORDER — AMOXICILLIN-POT CLAVULANATE 600-42.9 MG/5ML PO SUSR: 600.0000 mg | Freq: Two times a day (BID) | ORAL | Status: DC

## 2011-10-21 MED ORDER — TRAMADOL HCL 50 MG PO TABS: 50.0000 mg | ORAL_TABLET | Freq: Four times a day (QID) | ORAL | Status: DC | PRN

## 2011-10-21 NOTE — Progress Notes (Signed)
  Subjective:    Patient ID: Valerie Friedman, female    DOB: 06/18/1975, 36 y.o.   MRN: [id number]  HPI Here for one week of ear pressure, sinus pressure, PND, ST,and a dry cough. No fever.    Review of Systems  Constitutional: Negative.   HENT: Positive for ear pain, congestion, postnasal drip and sinus pressure.   Eyes: Negative.   Respiratory: Positive for cough.        Objective:   Physical Exam  Constitutional: She appears well-developed and well-nourished.  HENT:  Right Ear: External ear normal.  Left Ear: External ear normal.  Nose: Nose normal.  Mouth/Throat: Oropharynx is clear and moist.  Eyes: Conjunctivae normal are normal.  Pulmonary/Chest: Effort normal and breath sounds normal.          Assessment & Plan:  Add Mucinex

## 2012-03-25 ENCOUNTER — Encounter: Payer: Medicare Other | Admitting: Family Medicine

## 2012-06-11 ENCOUNTER — Other Ambulatory Visit: Payer: Self-pay | Admitting: Family Medicine

## 2012-06-15 NOTE — Telephone Encounter (Signed)
Call in #120 with 5 rf 

## 2012-07-09 ENCOUNTER — Encounter: Payer: Self-pay | Admitting: Family Medicine

## 2012-07-09 ENCOUNTER — Ambulatory Visit (INDEPENDENT_AMBULATORY_CARE_PROVIDER_SITE_OTHER): Payer: Medicare Other | Admitting: Family Medicine

## 2012-07-09 VITALS — BP 110/62 | HR 128 | Temp 98.6°F | Wt 116.0 lb

## 2012-07-09 DIAGNOSIS — J029 Acute pharyngitis, unspecified: Secondary | ICD-10-CM | POA: Diagnosis not present

## 2012-07-09 MED ORDER — CEPHALEXIN 500 MG PO CAPS: 500.0000 mg | ORAL_CAPSULE | Freq: Three times a day (TID) | ORAL | Status: AC

## 2012-07-09 MED ORDER — KETOROLAC TROMETHAMINE 60 MG/2ML IM SOLN
60.0000 mg | Freq: Once | INTRAMUSCULAR | Status: AC
  Administered 2012-07-09: 60 mg via INTRAMUSCULAR

## 2012-07-09 NOTE — Addendum Note (Signed)
Addended by: Aniceto Boss A on: 07/09/2012 02:29 PM   Modules accepted: Orders

## 2012-07-09 NOTE — Progress Notes (Signed)
  Subjective:    Patient ID: Valerie Friedman, female    DOB: 1975-12-15, 37 y.o.   MRN: [id number]  HPI Here for 4 days of a bad ST, HAs, body aches, and fever to 101.5 degrees. Some dry coughing.    Review of Systems  Constitutional: Positive for fever.  HENT: Positive for sore throat.   Eyes: Negative.   Respiratory: Positive for cough.        Objective:   Physical Exam  Constitutional: She appears well-developed and well-nourished.  HENT:  Right Ear: External ear normal.  Left Ear: External ear normal.  Nose: Nose normal.  Posterior OP is red and covered with exudate   Eyes: Conjunctivae are normal.  Neck:  Tender bilateral AC nodes   Pulmonary/Chest: Effort normal and breath sounds normal.          Assessment & Plan:  Drink fluids. Add Motrin prn

## 2012-08-19 ENCOUNTER — Encounter: Payer: Self-pay | Admitting: Gynecology

## 2012-09-16 ENCOUNTER — Other Ambulatory Visit: Payer: Self-pay | Admitting: Family Medicine

## 2012-09-16 NOTE — Telephone Encounter (Signed)
Call in #120 with 5 rf 

## 2012-11-01 ENCOUNTER — Ambulatory Visit (INDEPENDENT_AMBULATORY_CARE_PROVIDER_SITE_OTHER): Payer: Medicare Other | Admitting: Gynecology

## 2012-11-01 ENCOUNTER — Encounter: Payer: Self-pay | Admitting: Gynecology

## 2012-11-01 VITALS — BP 126/80

## 2012-11-01 DIAGNOSIS — N951 Menopausal and female climacteric states: Secondary | ICD-10-CM

## 2012-11-01 DIAGNOSIS — R110 Nausea: Secondary | ICD-10-CM | POA: Diagnosis not present

## 2012-11-01 DIAGNOSIS — N949 Unspecified condition associated with female genital organs and menstrual cycle: Secondary | ICD-10-CM | POA: Diagnosis not present

## 2012-11-01 DIAGNOSIS — N938 Other specified abnormal uterine and vaginal bleeding: Secondary | ICD-10-CM

## 2012-11-01 DIAGNOSIS — R635 Abnormal weight gain: Secondary | ICD-10-CM | POA: Diagnosis not present

## 2012-11-01 LAB — CBC WITH DIFFERENTIAL/PLATELET
Eosinophils Absolute: 0 10*3/uL (ref 0.0–0.7)
Hemoglobin: 14.4 g/dL (ref 12.0–15.0)
Lymphs Abs: 1.6 10*3/uL (ref 0.7–4.0)
MCH: 30.1 pg (ref 26.0–34.0)
Monocytes Relative: 6 % (ref 3–12)
Neutro Abs: 4.7 10*3/uL (ref 1.7–7.7)
Neutrophils Relative %: 70 % (ref 43–77)
RBC: 4.78 MIL/uL (ref 3.87–5.11)

## 2012-11-01 LAB — COMPREHENSIVE METABOLIC PANEL
ALT: 11 U/L (ref 0–35)
Albumin: 4.3 g/dL (ref 3.5–5.2)
CO2: 28 mEq/L (ref 19–32)
Calcium: 10 mg/dL (ref 8.4–10.5)
Chloride: 109 mEq/L (ref 96–112)
Potassium: 5.1 mEq/L (ref 3.5–5.3)
Sodium: 142 mEq/L (ref 135–145)
Total Protein: 6.9 g/dL (ref 6.0–8.3)

## 2012-11-01 NOTE — Patient Instructions (Signed)
Intrauterine Device Information  An intrauterine device (IUD) is inserted into your uterus and prevents pregnancy. There are 2 types of IUDs available:  · Copper IUD. This type of IUD is wrapped in copper wire and is placed inside the uterus. Copper makes the uterus and fallopian tubes produce a fluid that kills sperm. The copper IUD can stay in place for 10 years.  · Hormone IUD. This type of IUD contains the hormone progestin (synthetic progesterone). The hormone thickens the cervical mucus and prevents sperm from entering the uterus, and it also thins the uterine lining to prevent implantation of a fertilized egg. The hormone can weaken or kill the sperm that get into the uterus. The hormone IUD can stay in place for 5 years.  Your caregiver will make sure you are a good candidate for a contraceptive IUD. Discuss with your caregiver the possible side effects.  ADVANTAGES  · It is highly effective, reversible, long-acting, and low maintenance.  · There are no estrogen-related side effects.  · An IUD can be used when breastfeeding.  · It is not associated with weight gain.  · It works immediately after insertion.  · The copper IUD does not interfere with your female hormones.  · The progesterone IUD can make heavy menstrual periods lighter.  · The progesterone IUD can be used for 5 years.  · The copper IUD can be used for 10 years.  DISADVANTAGES  · The progesterone IUD can be associated with irregular bleeding patterns.  · The copper IUD can make your menstrual flow heavier and more painful.  · You may experience cramping and vaginal bleeding after insertion.  Document Released: 12/04/2003 Document Revised: 03/24/2011 Document Reviewed: 05/04/2010  [employer]® Patient Information ©2014 [employer].

## 2012-11-01 NOTE — Progress Notes (Signed)
Patiea normal menstrual cyclent presented to the office today with several questions to discuss. The patient's main concern is of concern of her possibly being pregnant. The patient had laparoscopic surgery last year for endometriosis. Last year she had episodes of irregular cycle and had FSH, TSH and prolactin which were drawn and were normal. My partner had put her on Provera 10 mg for 10 days to withdrawal since she had had an episode at amenorrhea. She has been complaining of some hot flashes for years but worse recently. She stated recently she had some nausea as well. Her partner has used condoms. She states her cycles have been irregular whereby she had a normal menstrual cycle in May that lasted 5-7 days which was typically normal for her but the month of June July August and September and on October her cycles of lasted 3 days and light. She states that for many years she's had bilateral galactorrhea. She also suffers from headaches in the morning before she has her first cup of  coffee. She also has been complaining of having gained 10 pounds over the course of the past 3 months. She reports having nausea as well. She stated she did a home pregnancy test at home 3 weeks ago which was normal.  Patient did not want to be examined today was here for consultation I would recommend we were the following tests: CBC, FSH, TSH, serum prolactin, qualitative hCG as well as a comprehensive metabolic panel. I did offer her to consider the use of Mirena IUD which will help for contraception as well as would help her for endometriosis. Literature information was provided. If all the above labs are normal I recommend that she followup with her primary physician for further workup.  The patient does have a history of myotonic muscular dystrophy for which she has been followed by her neurologist. The only medication the patient is currently taking is Zyrtec when necessary.

## 2012-11-02 LAB — FOLLICLE STIMULATING HORMONE: FSH: 10.5 m[IU]/mL

## 2012-11-02 LAB — TSH: TSH: 0.438 u[IU]/mL (ref 0.350–4.500)

## 2012-11-04 ENCOUNTER — Telehealth: Payer: Self-pay | Admitting: *Deleted

## 2012-11-04 NOTE — Telephone Encounter (Signed)
Please review my last note. Please inform her that all her labs were normal. Please check with Sherrilyn Rist since I believe she had informed me that Medicaid will not cover for her IUD. I would recommend that she followup within internus or PCP for her other symptoms. Meanwhile she should just used condom unless she's willing to pay out of pocket for the Mirena IUD.

## 2012-11-04 NOTE — Telephone Encounter (Signed)
Pt was informed with all lab results on 11/01/12, pt asked if you would call her at [phone]. I told pt that I was not sure if you would do this, but I would relay to you. Pt said she would like to know the next step. Please advise

## 2012-11-04 NOTE — Telephone Encounter (Signed)
Pt informed with the below note. 

## 2012-11-09 ENCOUNTER — Encounter: Payer: Self-pay | Admitting: Obstetrics and Gynecology

## 2012-11-16 ENCOUNTER — Ambulatory Visit (INDEPENDENT_AMBULATORY_CARE_PROVIDER_SITE_OTHER): Payer: Medicare Other | Admitting: Family Medicine

## 2012-11-16 ENCOUNTER — Encounter: Payer: Self-pay | Admitting: Family Medicine

## 2012-11-16 VITALS — BP 110/64 | HR 106 | Temp 98.6°F | Wt 122.0 lb

## 2012-11-16 DIAGNOSIS — J019 Acute sinusitis, unspecified: Secondary | ICD-10-CM | POA: Diagnosis not present

## 2012-11-16 MED ORDER — PROMETHAZINE HCL 25 MG PO TABS: 25.0000 mg | ORAL_TABLET | Freq: Four times a day (QID) | ORAL | Status: DC | PRN

## 2012-11-16 MED ORDER — TRAMADOL HCL 50 MG PO TABS: 100.0000 mg | ORAL_TABLET | Freq: Four times a day (QID) | ORAL | Status: DC

## 2012-11-16 MED ORDER — AMOXICILLIN-POT CLAVULANATE 875-125 MG PO TABS: 1.0000 | ORAL_TABLET | Freq: Two times a day (BID) | ORAL | Status: DC

## 2012-11-16 NOTE — Progress Notes (Signed)
  Subjective:    Patient ID: Valerie Friedman, female    DOB: 1975/04/05, 37 y.o.   MRN: [id number]  HPI Here for one week of sinus pressure, PND, and coughing up green sputum. Fever to 101 degrees.    Review of Systems  Constitutional: Positive for fever.  HENT: Positive for congestion, postnasal drip and sinus pressure.   Eyes: Negative.   Respiratory: Positive for cough.        Objective:   Physical Exam  Constitutional: She appears well-developed and well-nourished.  HENT:  Right Ear: External ear normal.  Left Ear: External ear normal.  Nose: Nose normal.  Mouth/Throat: Oropharynx is clear and moist.  Eyes: Conjunctivae are normal.  Neck: No thyromegaly present.  Pulmonary/Chest: Effort normal and breath sounds normal.  Lymphadenopathy:    She has no cervical adenopathy.          Assessment & Plan:  Add Mucinex.

## 2012-11-22 ENCOUNTER — Ambulatory Visit: Payer: Medicare Other | Admitting: Gynecology

## 2013-06-21 ENCOUNTER — Telehealth: Payer: Self-pay | Admitting: Family Medicine

## 2013-06-21 NOTE — Telephone Encounter (Signed)
[id number] - [place], [place] [address] AT [address] is requesting re-fill on traMADol (ULTRAM) 50 MG tablet

## 2013-06-21 NOTE — Telephone Encounter (Signed)
Call in #120 with 5 rf 

## 2013-06-22 MED ORDER — TRAMADOL HCL 50 MG PO TABS: 100.0000 mg | ORAL_TABLET | Freq: Four times a day (QID) | ORAL | Status: DC

## 2013-06-22 NOTE — Telephone Encounter (Signed)
Pt states she needs this rx today, advised pt we request up to 3 days for refill. Pt states she doesn't have three days, she needs today

## 2013-06-22 NOTE — Telephone Encounter (Signed)
I called in script 

## 2013-06-27 ENCOUNTER — Ambulatory Visit: Payer: Medicare Other | Admitting: Gynecology

## 2013-06-28 ENCOUNTER — Ambulatory Visit: Payer: Medicare Other | Admitting: Gynecology

## 2013-12-26 ENCOUNTER — Other Ambulatory Visit: Payer: Self-pay

## 2013-12-26 NOTE — Telephone Encounter (Signed)
Rx request for Tramadol HCL 50 mg tablet- Take 2 tablets by mouth 4 times a day as needed.  #120  Pharm:  [facility]  Pls advise.

## 2013-12-27 MED ORDER — TRAMADOL HCL 50 MG PO TABS: 100.0000 mg | ORAL_TABLET | Freq: Four times a day (QID) | ORAL | Status: DC

## 2013-12-27 NOTE — Telephone Encounter (Signed)
Call in #120 with 5 rf 

## 2013-12-27 NOTE — Telephone Encounter (Signed)
Rx called in to pharmacy. 

## 2014-03-21 ENCOUNTER — Ambulatory Visit: Payer: Medicare Other | Admitting: Family Medicine

## 2014-03-21 ENCOUNTER — Telehealth: Payer: Self-pay | Admitting: Family Medicine

## 2014-03-21 NOTE — Telephone Encounter (Signed)
Pt was on the schedule to see Dr. Sarajane Jews today for a sore throat. I called and left a voice message for pt to follow up as needed.

## 2014-03-24 ENCOUNTER — Telehealth: Payer: Self-pay | Admitting: Family Medicine

## 2014-03-24 ENCOUNTER — Ambulatory Visit (INDEPENDENT_AMBULATORY_CARE_PROVIDER_SITE_OTHER): Payer: Medicare Other | Admitting: Family Medicine

## 2014-03-24 ENCOUNTER — Encounter: Payer: Self-pay | Admitting: Family Medicine

## 2014-03-24 VITALS — BP 99/59 | HR 72 | Temp 99.0°F

## 2014-03-24 DIAGNOSIS — J020 Streptococcal pharyngitis: Secondary | ICD-10-CM | POA: Diagnosis not present

## 2014-03-24 DIAGNOSIS — J0100 Acute maxillary sinusitis, unspecified: Secondary | ICD-10-CM

## 2014-03-24 LAB — POCT RAPID STREP A (OFFICE): RAPID STREP A SCREEN: NEGATIVE

## 2014-03-24 MED ORDER — AMOXICILLIN-POT CLAVULANATE 875-125 MG PO TABS: 1.0000 | ORAL_TABLET | Freq: Two times a day (BID) | ORAL | Status: DC

## 2014-03-24 NOTE — Telephone Encounter (Signed)
emmi emailed °

## 2014-03-24 NOTE — Progress Notes (Signed)
Pre visit review using our clinic review tool, if applicable. No additional management support is needed unless otherwise documented below in the visit note. 

## 2014-03-27 ENCOUNTER — Encounter: Payer: Self-pay | Admitting: Family Medicine

## 2014-03-27 NOTE — Progress Notes (Signed)
   Subjective:    Patient ID: Valerie Friedman, female    DOB: 01/29/1975, 39 y.o.   MRN: [id number]  HPI Here for 4 days of sinus pressure, PND, ST, and a dry cough.    Review of Systems  Constitutional: Negative.   HENT: Positive for congestion, postnasal drip and sinus pressure.   Eyes: Negative.   Respiratory: Positive for cough.        Objective:   Physical Exam  Constitutional: She appears well-developed and well-nourished.  HENT:  Right Ear: External ear normal.  Left Ear: External ear normal.  Nose: Nose normal.  Mouth/Throat: Oropharynx is clear and moist.  Eyes: Conjunctivae are normal.  Pulmonary/Chest: Effort normal and breath sounds normal.  Lymphadenopathy:    She has no cervical adenopathy.          Assessment & Plan:  Add Mucinex and Advil prn

## 2014-07-27 ENCOUNTER — Other Ambulatory Visit: Payer: Self-pay | Admitting: Family Medicine

## 2014-07-27 NOTE — Telephone Encounter (Signed)
Call in #120 with 5 rf 

## 2015-02-17 ENCOUNTER — Other Ambulatory Visit: Payer: Self-pay | Admitting: Family Medicine

## 2015-02-20 NOTE — Telephone Encounter (Signed)
Call in #120 with 5 rf 

## 2015-03-07 ENCOUNTER — Ambulatory Visit (INDEPENDENT_AMBULATORY_CARE_PROVIDER_SITE_OTHER): Payer: Medicare Other | Admitting: Family Medicine

## 2015-03-07 ENCOUNTER — Encounter: Payer: Self-pay | Admitting: Family Medicine

## 2015-03-07 VITALS — BP 120/80 | HR 84 | Temp 98.4°F | Ht 63.0 in | Wt 128.0 lb

## 2015-03-07 DIAGNOSIS — E785 Hyperlipidemia, unspecified: Secondary | ICD-10-CM | POA: Diagnosis not present

## 2015-03-07 DIAGNOSIS — M797 Fibromyalgia: Secondary | ICD-10-CM

## 2015-03-07 MED ORDER — METHOCARBAMOL 500 MG PO TABS: 500.0000 mg | ORAL_TABLET | Freq: Four times a day (QID) | ORAL | Status: DC

## 2015-03-07 MED ORDER — DICLOFENAC SODIUM 75 MG PO TBEC: 75.0000 mg | DELAYED_RELEASE_TABLET | Freq: Two times a day (BID) | ORAL | Status: DC

## 2015-03-07 NOTE — Progress Notes (Signed)
Pre visit review using our clinic review tool, if applicable. No additional management support is needed unless otherwise documented below in the visit note. 

## 2015-03-07 NOTE — Progress Notes (Signed)
   Subjective:    Patient ID: Valerie Friedman, female    DOB: Apr 16, 1975, 40 y.o.   MRN: [id number]  HPI Here for one week of pain in the left middle back. She has fibromyalgia and she sometimes gets regional flares like this. No recent trauma.    Review of Systems  Constitutional: Negative.   Respiratory: Negative.   Cardiovascular: Negative.   Musculoskeletal: Positive for back pain.       Objective:   Physical Exam  Constitutional: She is oriented to person, place, and time. She appears well-developed and well-nourished.  Cardiovascular: Normal rate, regular rhythm, normal heart sounds and intact distal pulses.   Pulmonary/Chest: Effort normal and breath sounds normal.  Tender over the left middle back. Full ROM   Neurological: She is alert and oriented to person, place, and time.          Assessment & Plan:  Fibromyalgia flare, treat with heat, Diclofenac, and Robaxin prn.

## 2015-03-08 ENCOUNTER — Telehealth: Payer: Self-pay | Admitting: Family Medicine

## 2015-03-08 ENCOUNTER — Other Ambulatory Visit: Payer: Self-pay | Admitting: Family Medicine

## 2015-03-08 DIAGNOSIS — E875 Hyperkalemia: Secondary | ICD-10-CM

## 2015-03-08 LAB — CBC WITH DIFFERENTIAL/PLATELET
BASOS ABS: 0 10*3/uL (ref 0.0–0.1)
Basophils Relative: 0.5 % (ref 0.0–3.0)
EOS ABS: 0.1 10*3/uL (ref 0.0–0.7)
Eosinophils Relative: 0.6 % (ref 0.0–5.0)
HCT: 44.9 % (ref 36.0–46.0)
Hemoglobin: 14.7 g/dL (ref 12.0–15.0)
LYMPHS ABS: 1.7 10*3/uL (ref 0.7–4.0)
Lymphocytes Relative: 18.9 % (ref 12.0–46.0)
MCHC: 32.8 g/dL (ref 30.0–36.0)
MCV: 91.6 fl (ref 78.0–100.0)
Monocytes Absolute: 0.3 10*3/uL (ref 0.1–1.0)
Monocytes Relative: 3.2 % (ref 3.0–12.0)
NEUTROS ABS: 7 10*3/uL (ref 1.4–7.7)
NEUTROS PCT: 76.8 % (ref 43.0–77.0)
PLATELETS: 243 10*3/uL (ref 150.0–400.0)
RBC: 4.91 Mil/uL (ref 3.87–5.11)
RDW: 14.6 % (ref 11.5–15.5)
WBC: 9.1 10*3/uL (ref 4.0–10.5)

## 2015-03-08 LAB — HEPATIC FUNCTION PANEL
ALBUMIN: 4.4 g/dL (ref 3.5–5.2)
ALT: 15 U/L (ref 0–35)
AST: 25 U/L (ref 0–37)
Alkaline Phosphatase: 46 U/L (ref 39–117)
Bilirubin, Direct: 0.1 mg/dL (ref 0.0–0.3)
Total Bilirubin: 0.5 mg/dL (ref 0.2–1.2)
Total Protein: 7.5 g/dL (ref 6.0–8.3)

## 2015-03-08 LAB — BASIC METABOLIC PANEL
BUN: 14 mg/dL (ref 6–23)
CHLORIDE: 110 meq/L (ref 96–112)
CO2: 30 mEq/L (ref 19–32)
Calcium: 10 mg/dL (ref 8.4–10.5)
Creatinine, Ser: 0.75 mg/dL (ref 0.40–1.20)
GFR: 90.9 mL/min (ref >60.00)
GLUCOSE: 97 mg/dL (ref 70–99)
POTASSIUM: 6.6 meq/L — AB (ref 3.5–5.1)
Sodium: 144 mEq/L (ref 135–145)

## 2015-03-08 LAB — LIPID PANEL
Cholesterol: 189 mg/dL (ref 0–200)
HDL: 48.1 mg/dL (ref >39.00)
LDL Cholesterol: 119 mg/dL — ABNORMAL HIGH (ref 0–99)
NonHDL: 141.23
Total CHOL/HDL Ratio: 4
Triglycerides: 109 mg/dL (ref 0.0–149.0)
VLDL: 21.8 mg/dL (ref 0.0–40.0)

## 2015-03-08 LAB — PROLACTIN: PROLACTIN: 10.7 ng/mL

## 2015-03-08 LAB — CK: Total CK: 85 U/L (ref 7–177)

## 2015-03-08 LAB — TSH: TSH: 0.94 u[IU]/mL (ref 0.35–4.50)

## 2015-03-08 NOTE — Telephone Encounter (Signed)
CRITICAL VALUE STICKER  CRITICAL VALUE:potassium 6.6   RECEIVER (INITALS):sn  DATE & TIME NOTIFIED:03/08/2015 11:45 am MD NOTIFIED:  TIME OF NOTIFICATION:11:45 am  RESPONSE: md fry notified

## 2015-03-08 NOTE — Telephone Encounter (Signed)
This was taken care of

## 2015-03-12 ENCOUNTER — Other Ambulatory Visit (INDEPENDENT_AMBULATORY_CARE_PROVIDER_SITE_OTHER): Payer: Medicare Other

## 2015-03-12 DIAGNOSIS — E875 Hyperkalemia: Secondary | ICD-10-CM

## 2015-03-12 LAB — BASIC METABOLIC PANEL
BUN: 20 mg/dL (ref 6–23)
CALCIUM: 9.6 mg/dL (ref 8.4–10.5)
CO2: 29 mEq/L (ref 19–32)
CREATININE: 0.68 mg/dL (ref 0.40–1.20)
Chloride: 108 mEq/L (ref 96–112)
GFR: 101.78 mL/min (ref >60.00)
Glucose, Bld: 92 mg/dL (ref 70–99)
Potassium: 5.1 mEq/L (ref 3.5–5.1)
SODIUM: 143 meq/L (ref 135–145)

## 2015-03-12 LAB — POC URINALSYSI DIPSTICK (AUTOMATED)
Bilirubin, UA: NEGATIVE
Glucose, UA: NEGATIVE
Ketones, UA: NEGATIVE
Leukocytes, UA: NEGATIVE
Nitrite, UA: NEGATIVE
PROTEIN UA: NEGATIVE
RBC UA: NEGATIVE
SPEC GRAV UA: 1.02
UROBILINOGEN UA: 0.2
pH, UA: 6

## 2015-03-21 ENCOUNTER — Other Ambulatory Visit: Payer: Self-pay

## 2015-03-21 DIAGNOSIS — Z1231 Encounter for screening mammogram for malignant neoplasm of breast: Secondary | ICD-10-CM

## 2015-05-04 ENCOUNTER — Ambulatory Visit: Payer: Medicare Other | Admitting: Family Medicine

## 2015-07-27 ENCOUNTER — Ambulatory Visit: Payer: Medicare Other | Admitting: Gynecology

## 2015-08-29 ENCOUNTER — Telehealth: Payer: Self-pay | Admitting: Family Medicine

## 2015-08-29 NOTE — Telephone Encounter (Signed)
Pt has rash on right arm, and would like Dr Janee Morn to dx. Between elbow and wrist. Does not hurt, itch, burn, nothing. A friend pointed this out to hurt. Possible spider bite? Would like to rule out ring worm. Refused to see another provider. Ok to see Friday, or next week?

## 2015-08-31 NOTE — Telephone Encounter (Signed)
Please schedule her for an OV on Monday

## 2015-09-03 ENCOUNTER — Ambulatory Visit (INDEPENDENT_AMBULATORY_CARE_PROVIDER_SITE_OTHER): Payer: Medicare Other | Admitting: Family Medicine

## 2015-09-03 ENCOUNTER — Encounter: Payer: Self-pay | Admitting: Family Medicine

## 2015-09-03 VITALS — BP 120/62 | HR 68 | Ht 62.5 in | Wt 128.0 lb

## 2015-09-03 DIAGNOSIS — B354 Tinea corporis: Secondary | ICD-10-CM

## 2015-09-03 MED ORDER — KETOCONAZOLE 2 % EX CREA: 1.0000 "application " | TOPICAL_CREAM | Freq: Two times a day (BID) | CUTANEOUS | 5 refills | Status: DC | PRN

## 2015-09-03 MED ORDER — TRAMADOL HCL 50 MG PO TABS: ORAL_TABLET | ORAL | 5 refills | Status: DC

## 2015-09-03 NOTE — Progress Notes (Signed)
   Subjective:    Patient ID: Valerie Friedman, female    DOB: 07/12/1975, 40 y.o.   MRN: [id number]  HPI Here for one week of a slowly enlarging red spot on the right forearm. It does not burn or itch. No other areas are involved.    Review of Systems  Constitutional: Negative.   Skin: Positive for rash.       Objective:   Physical Exam  Constitutional: She appears well-developed and well-nourished.  Skin:  Right forearm has an annular lesion with a red slightly raised border and a macular clear area in the center           Assessment & Plan:  Ringworm, treat with Ketoconazole cream prn.  Laurey Morale, MD

## 2015-09-03 NOTE — Telephone Encounter (Signed)
Pt has been scheduled.  °

## 2016-03-10 ENCOUNTER — Encounter: Payer: Self-pay | Admitting: Family Medicine

## 2016-03-10 ENCOUNTER — Ambulatory Visit (INDEPENDENT_AMBULATORY_CARE_PROVIDER_SITE_OTHER): Payer: Medicare Other | Admitting: Family Medicine

## 2016-03-10 VITALS — BP 113/84 | HR 81 | Temp 98.4°F | Ht 62.5 in | Wt 130.0 lb

## 2016-03-10 DIAGNOSIS — S76212A Strain of adductor muscle, fascia and tendon of left thigh, initial encounter: Secondary | ICD-10-CM | POA: Diagnosis not present

## 2016-03-10 DIAGNOSIS — M79605 Pain in left leg: Secondary | ICD-10-CM | POA: Diagnosis not present

## 2016-03-10 MED ORDER — DICLOFENAC SODIUM 75 MG PO TBEC: 75.0000 mg | DELAYED_RELEASE_TABLET | Freq: Two times a day (BID) | ORAL | 2 refills | Status: DC

## 2016-03-10 MED ORDER — TRAMADOL HCL 50 MG PO TABS: ORAL_TABLET | ORAL | 5 refills | Status: DC

## 2016-03-10 NOTE — Progress Notes (Signed)
   Subjective:    Patient ID: Valerie Friedman, female    DOB: 1975-05-16, 41 y.o.   MRN: [id number]  HPI Here for 2 problems, one acute and the other chronic. First she describes a constant pain in the left leg around the hip area which radiates down to the lateral knee that started many months ago. No back pain. The leg sometimes gets numb and weak. Sometimes she has a noticeable limp when she walks. Also she had an injury that occurred at home one week ago when she started to get out of bed but had her left leg caught in the bed sheet. This caused her left leg to be pulled sideways and she fell to the floor. Since then she has a sharp pain in the left groin area. Tramdol and Flexeril do not help.    Review of Systems  Constitutional: Negative.   Musculoskeletal: Positive for gait problem and myalgias. Negative for back pain and joint swelling.       Objective:   Physical Exam  Constitutional: She appears well-developed and well-nourished.  She walks with a slight limp and has mild pain  Musculoskeletal:  The lower back is normal with no tenderness and full ROM. The left hip has full ROM with no tenderness. She is very tender over the left hip flexor and adductors.           Assessment & Plan:  She has an acute groin muscle strain and this should heal with time and rest. Use heat prn. Use Diclofenac bid prn. As for the chronic left leg issue, we will refer her to Sports Medicine to evaluate further.  Alysia Penna, MD

## 2016-03-10 NOTE — Progress Notes (Signed)
Pre visit review using our clinic review tool, if applicable. No additional management support is needed unless otherwise documented below in the visit note. 

## 2016-03-25 ENCOUNTER — Ambulatory Visit: Payer: Medicare Other | Admitting: Family Medicine

## 2016-03-25 NOTE — Progress Notes (Signed)
[facility] [place],  [address] Phone: [phone] Subjective:    I'm seeing this patient by the request  of:  Alysia Penna, MD   CC: Left leg pain  BSW:[id number]  [name] is a 41 y.o. female coming in with complaint of left leg pain. Past medical history significant for myositis as well as a myotonic dystrophy. Patient is also been diagnosed with fibromyalgia. Patient did see primary care provider in 3 weeks ago. Patient was having a constant pain in the left leg where the hip area that seems to radiate down the lateral aspect of the knee. Has been going on for many months. Denies any associated back pain. States though that sometimes the leg does feel heavy or maybe week. Symptoms of tingling sensation she describes as numbness. Patient states that because of this pain she has been limping. Patient does not remember any true injury. Patient states that there was one time when she did fall when getting out of the bed admitted contributed. States that she has more of a groin pain on this left side as well. Patient has tried different medications including tramadol and Flexeril without any significant improvement  Patient also states that she is having difficulty with walking at this time. Patient states that people told her that her limp seems to be getting worse.     Past Medical History:  Diagnosis Date  . Anxiety   . Chronic lower back pain DUE TO MYOTONIC DYSTROPHY  . Migraine   . Myotonic dystrophy ([place]) [place] 2009  ----- TYPE 1   FOLLOWED BY DR. Erling Cruz  . Numbness of fingers BILATERAL HANDS/  OCCASIONLLY RADIATES UP ARMS  . Pelvic pain in female    Past Surgical History:  Procedure Laterality Date  . BENIGN BREAST BX   FEB  2011  . LAPAROSCOPY  05/01/2011   Procedure: LAPAROSCOPY DIAGNOSTIC;  Surgeon: Bennetta Laos, MD;  Location: [facility];  Service: Gynecology;  Laterality: N/A;  with bilateral  chromopertubation, aspiration of functional left ovarian cyst, excision of two hydatid cysts  . MUSCLE BIOPSY  02-25-2007   LEFT THIGH QUADRICEP BX X3  FOR MYOSITIS  . SURG. FOR REMOVAL OF TEETH FRAGMENTS    MARCH 2012   [facility] SURGEON OFFICE   Social History   Social History  . Marital status: Single    Spouse name: N/A  . Number of children: N/A  . Years of education: N/A   Social History Main Topics  . Smoking status: Former Smoker    Years: 10.00    Types: Cigarettes    Quit date: 01/29/2011  . Smokeless tobacco: Never Used     Comment: varies, 0-0.5 pack per day  . Alcohol use 0.0 oz/week     Comment: rare  . Drug use: No  . Sexual activity: Not Asked   Other Topics Concern  . None   Social History Narrative  . None   Allergies  Allergen Reactions  . Latex Hives  . Aleve [Naproxen Sodium] Other (See Comments)    "GETS THE CHILLS"  . Ciprocin-Fluocin-Procin [Fluocinolone Acetonide] Other (See Comments)    GI UPSET AND HEADACHE  . Macrobid [Nitrofurantoin Monohydrate Macrocrystals] Other (See Comments)    GI UPSET; headache  . Yellow Dyes (Non-Tartrazine) Nausea And Vomiting    YELLOW DYE #5    . Other Rash    [place] JELLY   Family History  Problem Relation Age  of Onset  . Diabetes Maternal Grandfather   . Heart disease Maternal Grandfather   . Diabetes Mother   . Hypertension Father   . Cancer Paternal Uncle     Colon cancer  . Heart disease Maternal Grandmother   . Heart disease Paternal Grandmother   . Heart disease Paternal Grandfather     Past medical history, social, surgical and family history all reviewed in electronic medical record.  No pertanent information unless stated regarding to the chief complaint.   Review of Systems:Review of systems updated and as accurate as of 03/26/16  No visual changes, nausea, vomiting, diarrhea, constipation, dizziness, skin rash, fevers, chills, night sweats, weight loss, swollen lymph nodes, joint swelling,chest  pain, shortness of breath,  Positive for headaches, abdominal pain, body aches, joint swelling, muscle aches, mood changes  Objective  Blood pressure 118/76, pulse 73, height 5' 2.5" (1.588 m), weight 127 lb 8 oz (57.8 kg), SpO2 96 %. Systems examined below as of 03/26/16   General: No apparent distress alert and oriented x3 mood and affect normal, dressed appropriately.  HEENT: Pupils equal, extraocular movements intact  Respiratory: Patient's speak in full sentences and does not appear short of breath  Cardiovascular: No lower extremity edema, non tender, no erythema  Skin: Warm dry intact with no signs of infection or rash on extremities or on axial skeleton.  Abdomen: Soft nontender  Neuro: Cranial nerves II through XII are intact, neurovascularly intact in all extremities with 2+ DTRs and 2+ pulses.  Lymph: No lymphadenopathy of posterior or anterior cervical chain or axillae bilaterally.  Gait patient does have antalgic gait she also has what appears to be foot drop on the right side. Severe weakness of the hip girdle that causes patient has some mild instability with walking. MSK:  Patient does have axial skeletal weakness especially of the hip abductor's as well as hip flexors. Patient also unfortunately has more of a right-sided foot drop. 4-5 strength of the right foot compared to the contralateral foot. She doesn't diffuse discomfort to palpation of most of her musculature. More pain over the axial skeleton as well. Left leg seems to be doing very well. Negative straight leg test bilaterally. Negative Faber test bilaterally. Mild increasing tenderness over the sacroiliac joint on the right side  Procedure note 97110; 15 minutes spent for Therapeutic exercises as stated in above notes.  This included exercises focusing on stretching, strengthening, with significant focus on eccentric aspects.  Low back exercises that included:  Pelvic tilt/bracing instruction to focus on control of  the pelvic girdle and lower abdominal muscles  Glute strengthening exercises, focusing on proper firing of the glutes without engaging the low back muscles Proper stretching techniques for maximum relief for the hamstrings, hip flexors, low back and some rotation where tolerated   Proper technique shown and discussed handout in great detail with ATC.  All questions were discussed and answered.     Impression and Recommendations:     This case required medical decision making of moderate complexity.      Note: This dictation was prepared with Dragon dictation along with smaller phrase technology. Any transcriptional errors that result from this process are unintentional.

## 2016-03-26 ENCOUNTER — Other Ambulatory Visit (INDEPENDENT_AMBULATORY_CARE_PROVIDER_SITE_OTHER): Payer: Medicare Other

## 2016-03-26 ENCOUNTER — Ambulatory Visit (INDEPENDENT_AMBULATORY_CARE_PROVIDER_SITE_OTHER): Payer: Medicare Other | Admitting: Family Medicine

## 2016-03-26 ENCOUNTER — Encounter: Payer: Self-pay | Admitting: Family Medicine

## 2016-03-26 VITALS — BP 118/76 | HR 73 | Ht 62.5 in | Wt 127.5 lb

## 2016-03-26 DIAGNOSIS — M6281 Muscle weakness (generalized): Secondary | ICD-10-CM

## 2016-03-26 DIAGNOSIS — E559 Vitamin D deficiency, unspecified: Secondary | ICD-10-CM | POA: Diagnosis not present

## 2016-03-26 DIAGNOSIS — E611 Iron deficiency: Secondary | ICD-10-CM | POA: Diagnosis not present

## 2016-03-26 DIAGNOSIS — M21371 Foot drop, right foot: Secondary | ICD-10-CM

## 2016-03-26 DIAGNOSIS — E538 Deficiency of other specified B group vitamins: Secondary | ICD-10-CM | POA: Diagnosis not present

## 2016-03-26 DIAGNOSIS — R5383 Other fatigue: Secondary | ICD-10-CM | POA: Diagnosis not present

## 2016-03-26 DIAGNOSIS — G7111 Myotonic muscular dystrophy: Secondary | ICD-10-CM | POA: Diagnosis not present

## 2016-03-26 LAB — COMPREHENSIVE METABOLIC PANEL
ALBUMIN: 4.3 g/dL (ref 3.5–5.2)
ALT: 51 U/L — ABNORMAL HIGH (ref 0–35)
AST: 42 U/L — ABNORMAL HIGH (ref 0–37)
Alkaline Phosphatase: 50 U/L (ref 39–117)
BILIRUBIN TOTAL: 0.8 mg/dL (ref 0.2–1.2)
BUN: 13 mg/dL (ref 6–23)
CALCIUM: 10.2 mg/dL (ref 8.4–10.5)
CHLORIDE: 105 meq/L (ref 96–112)
CO2: 31 mEq/L (ref 19–32)
Creatinine, Ser: 0.73 mg/dL (ref 0.40–1.20)
GFR: 93.29 mL/min (ref >60.00)
Glucose, Bld: 124 mg/dL — ABNORMAL HIGH (ref 70–99)
POTASSIUM: 4.7 meq/L (ref 3.5–5.1)
Sodium: 142 mEq/L (ref 135–145)
Total Protein: 7.2 g/dL (ref 6.0–8.3)

## 2016-03-26 LAB — CK: CK TOTAL: 82 U/L (ref 7–177)

## 2016-03-26 LAB — CBC WITH DIFFERENTIAL/PLATELET
Basophils Absolute: 0.1 10*3/uL (ref 0.0–0.1)
Basophils Relative: 0.8 % (ref 0.0–3.0)
EOS PCT: 0.7 % (ref 0.0–5.0)
Eosinophils Absolute: 0.1 10*3/uL (ref 0.0–0.7)
HEMATOCRIT: 44.8 % (ref 36.0–46.0)
Hemoglobin: 14.7 g/dL (ref 12.0–15.0)
Lymphocytes Relative: 19 % (ref 12.0–46.0)
Lymphs Abs: 1.3 10*3/uL (ref 0.7–4.0)
MCHC: 32.7 g/dL (ref 30.0–36.0)
MCV: 91.2 fl (ref 78.0–100.0)
MONOS PCT: 4.6 % (ref 3.0–12.0)
Monocytes Absolute: 0.3 10*3/uL (ref 0.1–1.0)
NEUTROS ABS: 5.2 10*3/uL (ref 1.4–7.7)
Neutrophils Relative %: 74.9 % (ref 43.0–77.0)
PLATELETS: 259 10*3/uL (ref 150.0–400.0)
RBC: 4.91 Mil/uL (ref 3.87–5.11)
RDW: 14.1 % (ref 11.5–15.5)
WBC: 7 10*3/uL (ref 4.0–10.5)

## 2016-03-26 LAB — FERRITIN: Ferritin: 44.4 ng/mL (ref 10.0–291.0)

## 2016-03-26 LAB — IBC PANEL
IRON: 167 ug/dL — AB (ref 42–145)
Saturation Ratios: 39 % (ref 20.0–50.0)
TRANSFERRIN: 306 mg/dL (ref 212.0–360.0)

## 2016-03-26 LAB — VITAMIN D 25 HYDROXY (VIT D DEFICIENCY, FRACTURES): VITD: 17.58 ng/mL — AB (ref 30.00–100.00)

## 2016-03-26 LAB — VITAMIN B12: Vitamin B-12: 721 pg/mL (ref 211–911)

## 2016-03-26 LAB — TSH: TSH: 0.86 u[IU]/mL (ref 0.35–4.50)

## 2016-03-26 LAB — C-REACTIVE PROTEIN: CRP: 0.2 mg/dL — AB (ref 0.5–20.0)

## 2016-03-26 LAB — SEDIMENTATION RATE: Sed Rate: 21 mm/hr — ABNORMAL HIGH (ref 0–20)

## 2016-03-26 LAB — FOLATE: Folate: >23.5 ng/mL (ref >5.9)

## 2016-03-26 MED ORDER — GABAPENTIN 100 MG PO CAPS: 200.0000 mg | ORAL_CAPSULE | Freq: Every day | ORAL | 3 refills | Status: DC

## 2016-03-26 NOTE — Patient Instructions (Signed)
Good to see you.  We will try some strengthening exercises 3 times a week.  Gabapentin 200mg  at night may help if this is a nerve and may help with the headaches and sleep.  Lets get labs today and see if anything is abnormal.  Dr. Carles Collet or someone in our neurology department should get a hold of you.  They are wonderful and will help out a lot.  When you take your tramadol you can take 1 tylenol as well.  See me again in 3 weeks but we will be in contact first.

## 2016-03-26 NOTE — Assessment & Plan Note (Signed)
Discussed with patient at great length. I do feel that myotonic dystrophy is contributing to some of this. There is a possibility for patient having more of a lumbar radiculopathy. Patient though does have pain and seems to be diffusely. Severe weakness of the axial skeleton noted today as well. Patient does have what appears to be a foot drop that could be unfortunately progression of the myotonic dystrophy. Patient has not seen a neurologist for greater than 2 years and I would like to refer her for further evaluation at this time. In addition of this I do feel that patient wouldn't do well with a laboratory workup to rule out any other type of comorbidities that could be contribute in. No suggestions or medications except for the gabapentin to see if this will be beneficial with patient having difficulty with headaches as well as sleep. We discussed more low impact exercises at this time such as aquatic therapy. Patient declined formal physical therapy. I do keep a close eye on patient and she will follow-up again with me in 2-3 weeks.

## 2016-03-26 NOTE — Assessment & Plan Note (Signed)
As stated previously likely secondary to myotonic dystrophy. Differential includes a potential lumbar radiculopathy but negative straight leg test noted today. Patient will be started on gabapentin in case there is a potential neurologic aspect to it. Do feel that potential EMG would be beneficial but we will have neurology consider further evaluation. Patient will follow-up with me again in 3 weeks.

## 2016-03-27 LAB — PTH, INTACT AND CALCIUM
CALCIUM: 10.3 mg/dL — AB (ref 8.6–10.2)
PTH: 27 pg/mL (ref 14–64)

## 2016-03-31 ENCOUNTER — Ambulatory Visit: Payer: Medicare Other | Admitting: Family Medicine

## 2016-04-01 ENCOUNTER — Other Ambulatory Visit: Payer: Self-pay

## 2016-04-01 MED ORDER — VITAMIN D (ERGOCALCIFEROL) 1.25 MG (50000 UNIT) PO CAPS: 50000.0000 [IU] | ORAL_CAPSULE | ORAL | 0 refills | Status: DC

## 2016-04-16 ENCOUNTER — Encounter: Payer: Self-pay | Admitting: Gynecology

## 2016-04-16 ENCOUNTER — Telehealth: Payer: Self-pay | Admitting: *Deleted

## 2016-04-16 ENCOUNTER — Ambulatory Visit (INDEPENDENT_AMBULATORY_CARE_PROVIDER_SITE_OTHER): Payer: Medicare HMO | Admitting: Gynecology

## 2016-04-16 ENCOUNTER — Other Ambulatory Visit: Payer: Self-pay | Admitting: Gynecology

## 2016-04-16 VITALS — BP 126/82 | Ht 62.0 in | Wt 126.0 lb

## 2016-04-16 DIAGNOSIS — E559 Vitamin D deficiency, unspecified: Secondary | ICD-10-CM | POA: Diagnosis not present

## 2016-04-16 DIAGNOSIS — R7989 Other specified abnormal findings of blood chemistry: Secondary | ICD-10-CM

## 2016-04-16 DIAGNOSIS — Z01411 Encounter for gynecological examination (general) (routine) with abnormal findings: Secondary | ICD-10-CM | POA: Diagnosis not present

## 2016-04-16 DIAGNOSIS — N912 Amenorrhea, unspecified: Secondary | ICD-10-CM

## 2016-04-16 DIAGNOSIS — Z113 Encounter for screening for infections with a predominantly sexual mode of transmission: Secondary | ICD-10-CM | POA: Diagnosis not present

## 2016-04-16 DIAGNOSIS — R232 Flushing: Secondary | ICD-10-CM

## 2016-04-16 DIAGNOSIS — F17200 Nicotine dependence, unspecified, uncomplicated: Secondary | ICD-10-CM | POA: Diagnosis not present

## 2016-04-16 DIAGNOSIS — R945 Abnormal results of liver function studies: Secondary | ICD-10-CM

## 2016-04-16 DIAGNOSIS — N644 Mastodynia: Secondary | ICD-10-CM

## 2016-04-16 NOTE — Patient Instructions (Signed)
Steps to Quit Smoking Smoking tobacco can be harmful to your health and can affect almost every organ in your body. Smoking puts you, and those around you, at risk for developing many serious chronic diseases. Quitting smoking is difficult, but it is one of the best things that you can do for your health. It is never too late to quit. What are the benefits of quitting smoking? When you quit smoking, you lower your risk of developing serious diseases and conditions, such as:  Lung cancer or lung disease, such as COPD.  Heart disease.  Stroke.  Heart attack.  Infertility.  Osteoporosis and bone fractures.  Additionally, symptoms such as coughing, wheezing, and shortness of breath may get better when you quit. You may also find that you get sick less often because your body is stronger at fighting off colds and infections. If you are pregnant, quitting smoking can help to reduce your chances of having a baby of low birth weight. How do I get ready to quit? When you decide to quit smoking, create a plan to make sure that you are successful. Before you quit:  Pick a date to quit. Set a date within the next two weeks to give you time to prepare.  Write down the reasons why you are quitting. Keep this list in places where you will see it often, such as on your bathroom mirror or in your car or wallet.  Identify the people, places, things, and activities that make you want to smoke (triggers) and avoid them. Make sure to take these actions: ? Throw away all cigarettes at home, at work, and in your car. ? Throw away smoking accessories, such as ashtrays and lighters. ? Clean your car and make sure to empty the ashtray. ? Clean your home, including curtains and carpets.  Tell your family, friends, and coworkers that you are quitting. Support from your loved ones can make quitting easier.  Talk with your health care provider about your options for quitting smoking.  Find out what treatment  options are covered by your health insurance.  What strategies can I use to quit smoking? Talk with your healthcare provider about different strategies to quit smoking. Some strategies include:  Quitting smoking altogether instead of gradually lessening how much you smoke over a period of time. Research shows that quitting "cold turkey" is more successful than gradually quitting.  Attending in-person counseling to help you build problem-solving skills. You are more likely to have success in quitting if you attend several counseling sessions. Even short sessions of 10 minutes can be effective.  Finding resources and support systems that can help you to quit smoking and remain smoke-free after you quit. These resources are most helpful when you use them often. They can include: ? Online chats with a counselor. ? Telephone quitlines. ? Printed self-help materials. ? Support groups or group counseling. ? Text messaging programs. ? Mobile phone applications.  Taking medicines to help you quit smoking. (If you are pregnant or breastfeeding, talk with your health care provider first.) Some medicines contain nicotine and some do not. Both types of medicines help with cravings, but the medicines that include nicotine help to relieve withdrawal symptoms. Your health care provider may recommend: ? Nicotine patches, gum, or lozenges. ? Nicotine inhalers or sprays. ? Non-nicotine medicine that is taken by mouth.  Talk with your health care provider about combining strategies, such as taking medicines while you are also receiving in-person counseling. Using these two strategies together   makes you more likely to succeed in quitting than if you used either strategy on its own. If you are pregnant or breastfeeding, talk with your health care provider about finding counseling or other support strategies to quit smoking. Do not take medicine to help you quit smoking unless told to do so by your health care  provider. What things can I do to make it easier to quit? Quitting smoking might feel overwhelming at first, but there is a lot that you can do to make it easier. Take these important actions:  Reach out to your family and friends and ask that they support and encourage you during this time. Call telephone quitlines, reach out to support groups, or work with a counselor for support.  Ask people who smoke to avoid smoking around you.  Avoid places that trigger you to smoke, such as bars, parties, or smoke-break areas at work.  Spend time around people who do not smoke.  Lessen stress in your life, because stress can be a smoking trigger for some people. To lessen stress, try: ? Exercising regularly. ? Deep-breathing exercises. ? Yoga. ? Meditating. ? Performing a body scan. This involves closing your eyes, scanning your body from head to toe, and noticing which parts of your body are particularly tense. Purposefully relax the muscles in those areas.  Download or purchase mobile phone or tablet apps (applications) that can help you stick to your quit plan by providing reminders, tips, and encouragement. There are many free apps, such as QuitGuide from the CDC (Centers for Disease Control and Prevention). You can find other support for quitting smoking (smoking cessation) through smokefree.gov and other websites.  How will I feel when I quit smoking? Within the first 24 hours of quitting smoking, you may start to feel some withdrawal symptoms. These symptoms are usually most noticeable 2-3 days after quitting, but they usually do not last beyond 2-3 weeks. Changes or symptoms that you might experience include:  Mood swings.  Restlessness, anxiety, or irritation.  Difficulty concentrating.  Dizziness.  Strong cravings for sugary foods in addition to nicotine.  Mild weight gain.  Constipation.  Nausea.  Coughing or a sore throat.  Changes in how your medicines work in your  body.  A depressed mood.  Difficulty sleeping (insomnia).  After the first 2-3 weeks of quitting, you may start to notice more positive results, such as:  Improved sense of smell and taste.  Decreased coughing and sore throat.  Slower heart rate.  Lower blood pressure.  Clearer skin.  The ability to breathe more easily.  Fewer sick days.  Quitting smoking is very challenging for most people. Do not get discouraged if you are not successful the first time. Some people need to make many attempts to quit before they achieve long-term success. Do your best to stick to your quit plan, and talk with your health care provider if you have any questions or concerns. This information is not intended to replace advice given to you by your health care provider. Make sure you discuss any questions you have with your health care provider. Document Released: 12/24/2000 Document Revised: 08/28/2015 Document Reviewed: 05/16/2014 Elsevier Interactive Patient Education  2017 Elsevier Inc.  

## 2016-04-16 NOTE — Progress Notes (Signed)
Valerie Friedman September 19, 1975 [id number]   History:    41 y.o.  for annual gyn exam who has not been seen the office since 2014. #1 patient sexually active wanted to have an STD screen has not had a menstrual cycles since the end of January. Patient complaining of breast tenderness, mild galactorrhea and frontal headache. #2 patient has been followed by sports medicine physician is a result of her myotonic dystrophy. Patient has been taking tramadol for her pains and when I reviewed her lab tests recently showed that her liver function tests were slightly above normal. #3 her labs indicated she has vitamin D deficiency currently on no medication #4 patient complaining of hot flashes #5 patient is a smoker in the process of switching or  To  e-cigarette  Past medical history,surgical history, family history and social history were all reviewed and documented in the EPIC chart.  Gynecologic History Patient's last menstrual period was 02/14/2016 (approximate). Contraception: none Last Pap: 2012. Results were: normal Last mammogram: 2013. Results were: normal  Obstetric History OB History  Gravida Para Term Preterm AB Living  0            SAB TAB Ectopic Multiple Live Births                    ROS: A ROS was performed and pertinent positives and negatives are included in the history.  GENERAL: No fevers or chills. HEENT: No change in vision, no earache, sore throat or sinus congestion. NECK: No pain or stiffness. CARDIOVASCULAR: No chest pain or pressure. No palpitations. PULMONARY: No shortness of breath, cough or wheeze. GASTROINTESTINAL: No abdominal pain, nausea, vomiting or diarrhea, melena or bright red blood per rectum. GENITOURINARY: No urinary frequency, urgency, hesitancy or dysuria. MUSCULOSKELETAL: No joint or muscle pain, no back pain, no recent trauma. DERMATOLOGIC: No rash, no itching, no lesions. ENDOCRINE: No polyuria, polydipsia, no heat or cold intolerance. No recent  change in weight. HEMATOLOGICAL: No anemia or easy bruising or bleeding. NEUROLOGIC: No headache, seizures, numbness, tingling or weakness. PSYCHIATRIC: No depression, no loss of interest in normal activity or change in sleep pattern.     Exam: chaperone present  BP 126/82   Ht 5\' 2"  (1.575 m)   Wt 126 lb (57.2 kg)   LMP 02/14/2016 (Approximate)   BMI 23.05 kg/m   Body mass index is 23.05 kg/m.  General appearance : Well developed well nourished female. No acute distress HEENT: Eyes: no retinal hemorrhage or exudates,  Neck supple, trachea midline, no carotid bruits, no thyroidmegaly Lungs: Clear to auscultation, no rhonchi or wheezes, or rib retractions  Heart: Regular rate and rhythm, no murmurs or gallops Breast:Examined in sitting and supine position were symmetrical in appearance, no palpable masses or tenderness,  no skin retraction, no nipple inversion, no nipple discharge, no skin discoloration, no axillary or supraclavicular lymphadenopathy Abdomen: no palpable masses or tenderness, no rebound or guarding Extremities: no edema or skin discoloration or tenderness  Pelvic:  Bartholin, Urethra, Skene Glands: Within normal limits             Vagina: No gross lesions or discharge  Cervix: No gross lesions or discharge  Uterus  anteverted, normal size, shape and consistency, non-tender and mobile  Adnexa  Without masses or tenderness  Anus and perineum  normal   Rectovaginal  normal sphincter tone without palpated masses or tenderness             Hemoccult not  indicated     Assessment/Plan:  41 y.o. female for annual exam had a Pap smear with HPV screening today. We'll also screening for GC and Chlamydia culture in this part of her STD screening she will have an HIV, RPR, hepatitis B and C. Because one month ago she had elevated liver function tests and SGOT and SGPT will be obtained today. Because of her amenorrhea a qualitative beta-hCG will be drawn today as well. Also  because of her amenorrhea were going to check her prolactin because she's had some galactorrhea and headaches and in the event of premature menopause/premature ovarian failure we'll going to check a [facility] today as well. She will return back to the office next week for consultation discuss these results and plan a course of management.  An additional 15 minutes was spent on going over causes and treatment for secondary amenorrhea to include premature ovarian failure, hyperprolactinemia, pregnancy. Also we spent time discussing etiologies for elevated liver function tests and hypercalcemia.   Terrance Mass MD, 1:07 PM 04/16/2016

## 2016-04-16 NOTE — Telephone Encounter (Signed)
Kay from breast center called stating pt was told to call and schedule a diagnostic mammogram and ultrasound? Patients can not schedule this imaging they need orders from doctor office. No mention in note about scheduling imaging, I do see where you mention breast tenderness and galactorrhea. Please advise

## 2016-04-17 ENCOUNTER — Other Ambulatory Visit: Payer: Self-pay | Admitting: Gynecology

## 2016-04-17 ENCOUNTER — Ambulatory Visit
Admission: RE | Admit: 2016-04-17 | Discharge: 2016-04-17 | Disposition: A | Discharge status: Home / Self Care | Payer: Medicare HMO | Source: Ambulatory Visit | Attending: Gynecology | Admitting: Gynecology

## 2016-04-17 DIAGNOSIS — Z1231 Encounter for screening mammogram for malignant neoplasm of breast: Secondary | ICD-10-CM

## 2016-04-17 LAB — PROLACTIN: Prolactin: 7.7 ng/mL

## 2016-04-17 LAB — HCG, SERUM, QUALITATIVE: Preg, Serum: NEGATIVE

## 2016-04-17 MED ORDER — MEDROXYPROGESTERONE ACETATE 10 MG PO TABS: 10.0000 mg | ORAL_TABLET | Freq: Every day | ORAL | 11 refills | Status: DC

## 2016-04-17 NOTE — Telephone Encounter (Signed)
Please inform them that the last mammogram and ultrasound of her breasts that I have on record was 2013 and this is what they documented:  Ultrasound is performed, showing normal tissue in the upper-outer quadrant of the left breast.  IMPRESSION: Normal ultrasound, upper-outer quadrant of the left breast.  No mammographic evidence of malignancy.  Recommend screening mammography to begin at the age of 41 years old.  BI-RADS CATEGORY 1:  Negative.  So all she needs is an annual three-dimensional screening mammogram

## 2016-04-17 NOTE — Telephone Encounter (Signed)
Pt informed, mammogram scheduled today at 12:50pm at breast center

## 2016-04-18 LAB — PAP, TP IMAGING W/ HPV RNA, RFLX HPV TYPE 16,18/45: HPV mRNA, High Risk: NOT DETECTED

## 2016-04-21 ENCOUNTER — Encounter: Payer: Self-pay | Admitting: Gynecology

## 2016-04-21 ENCOUNTER — Ambulatory Visit (INDEPENDENT_AMBULATORY_CARE_PROVIDER_SITE_OTHER): Payer: Medicare HMO | Admitting: Gynecology

## 2016-04-21 VITALS — Ht 62.0 in | Wt 126.0 lb

## 2016-04-21 DIAGNOSIS — R7989 Other specified abnormal findings of blood chemistry: Secondary | ICD-10-CM | POA: Diagnosis not present

## 2016-04-21 DIAGNOSIS — E559 Vitamin D deficiency, unspecified: Secondary | ICD-10-CM

## 2016-04-21 DIAGNOSIS — R945 Abnormal results of liver function studies: Secondary | ICD-10-CM

## 2016-04-21 DIAGNOSIS — N912 Amenorrhea, unspecified: Secondary | ICD-10-CM

## 2016-04-21 NOTE — Addendum Note (Signed)
Addended by: Joaquin Music on: 04/21/2016 12:29 PM   Modules accepted: Orders

## 2016-04-21 NOTE — Progress Notes (Signed)
  Patient is a 41 year old who presented to the office to discuss her recent lab results and plan a course of management as a result of her oligomenorrhea. Her history is as follows:  Her main complaint in the last office visit was that she not had a menstrual cycle since seen of January and sometimes Ed use condoms of the time she has not. Also she complained of mild galactorrhea and frontal headaches at times. She's been followed by sports medicine physician for her myotonic dystrophy. She takes tramadol 150 mg every day. That physician had done a vitamin D level which was found to be low and currently on no medication. She also explained some hot flashes and she was a smoker which recently with switching over to e-cigarette.  She had a negative urine pregnancy test and negative qualitative beta-hCG. Her comprehensive metabolic panel and indicated on March 14 that her calcium level was elevated at 10.3 but she states she takes a lot of calcium through dairy products and milk. Her prolactin level was normal. Her liver function tests were slightly elevated with an SGOT of 42 and SGPT of 51.  Patient will have an [place] and vitamin D level drawn today. If her [place] is normal she has a prescription for Provera to take 10 mg 1 by mouth daily every 35 days that she does not have a spontaneous menses if a home pregnancy test is negative. She's going to return back to the office in one month after she tapers off her tramadol to recheck her liver function tests at which time we will do a conference metabolic panel along with a fasting lipid profile since she has not had one since 2017. We also discussed alternative forms of contraception and she may be interested in the [place] first we will consider placing it her arm if her liver function test return back to normal. If after she comes off the tramadol if her liver function tests are still elevated on repeat we'll need to referred to the gastroenterologist.  Greater  than 90% time was spent calcium according care for this patient. Time of consultation 15 minutes

## 2016-04-21 NOTE — Addendum Note (Signed)
Addended by: Terrance Mass on: 04/21/2016 02:11 PM   Modules accepted: Orders

## 2016-04-22 ENCOUNTER — Encounter: Payer: Self-pay | Admitting: Gynecology

## 2016-04-22 ENCOUNTER — Ambulatory Visit (INDEPENDENT_AMBULATORY_CARE_PROVIDER_SITE_OTHER): Payer: Medicare HMO | Admitting: Family Medicine

## 2016-04-22 DIAGNOSIS — G7111 Myotonic muscular dystrophy: Secondary | ICD-10-CM

## 2016-04-22 DIAGNOSIS — E559 Vitamin D deficiency, unspecified: Secondary | ICD-10-CM

## 2016-04-22 LAB — VITAMIN D 25 HYDROXY (VIT D DEFICIENCY, FRACTURES): Vit D, 25-Hydroxy: 23 ng/mL — ABNORMAL LOW (ref 30–100)

## 2016-04-22 LAB — FOLLICLE STIMULATING HORMONE: FSH: 4.5 m[IU]/mL

## 2016-04-22 NOTE — Progress Notes (Signed)
Pre-visit discussion using our clinic review tool. No additional management support is needed unless otherwise documented below in the visit note.  

## 2016-04-22 NOTE — Assessment & Plan Note (Signed)
Patient continues to muscle aches and pains. Patient has done many metal multiple medications over the course of time. Patient was fairly noncompliant with our different suggestions. Patient has not seen neurology. Discussed with patient again at great length about different treatment options. Patient will try more of a natural approach. Discuss other potential over-the-counter medications. Patient was given bracing for the right ankle on the left knee that patient states that she has instability even though none was noted today. Patient continues to have significant amount of pain and I will send her to pain management for further evaluation and treatment. Spent  25 minutes with patient face-to-face and had greater than 50% of counseling including as described above in assessment and plan.

## 2016-04-22 NOTE — Patient Instructions (Signed)
Good to see you  [phone] is [facility] and they are reviewing you.  I will send my note as well.  Consider L-Arginine, any dose daily to help with muscle strength and growth.  CoQ10 400mg  daily can stabilize muscle cells for strength  You are making some progress. Continue the exercises 3 times a week.  Wear the knee brace and we will get you the ankle brace soon.  See me again in 6 weeks.

## 2016-04-22 NOTE — Assessment & Plan Note (Signed)
Encouraged patient to take the once weekly vitamin D that patient never picked up.

## 2016-04-22 NOTE — Progress Notes (Signed)
[facility] [address], [place] [address] Phone: [phone] Subjective:    I'm seeing this patient by the request  of:  Alysia Penna, MD   CC: Left leg pain f/u  [id number]  Valerie Friedman is a 41 y.o. female coming in with complaint of left leg pain. Past medical history significant for myositis as well as a myotonic dystrophy. Patient is also been diagnosed with fibromyalgia. Patient was seen by me and did have some mild weakness of the left leg. Patient was to have laboratory workup. Labs were reviewed by me and did show the patient had a very low vitamin D. Her on once weekly vitamin D. Patient was also started on gabapentin. Patient was having right foot drop. Patient states she did not take the vitamin D. Unable to tolerate the gabapentin. Been doing exercises but has not notice any significant improvement. Patient states that she continues to have pain. Seems to be mostly in the lower back, left leg and knee, as well as right ankle. Patient is concerned because the pain seems to be working. Patient has not made progress that she was she had. Patient is seen a gynecologist and told her not to take the once weekly vitamin D at this point.    Past Medical History:  Diagnosis Date  . Anxiety   . Chronic lower back pain DUE TO MYOTONIC DYSTROPHY  . Migraine   . Myotonic dystrophy ([place]) [place] 2009  ----- TYPE 1   FOLLOWED BY DR. Erling Cruz  . Numbness of fingers BILATERAL HANDS/  OCCASIONLLY RADIATES UP ARMS  . Pelvic pain in female   . Vitamin D deficiency    Past Surgical History:  Procedure Laterality Date  . BENIGN BREAST BX   FEB  2011  . BREAST BIOPSY Right 2011   Benign Stereo   . LAPAROSCOPY  05/01/2011   Procedure: LAPAROSCOPY DIAGNOSTIC;  Surgeon: Bennetta Laos, MD;  Location: [facility];  Service: Gynecology;  Laterality: N/A;  with bilateral chromopertubation, aspiration of functional left ovarian cyst,  excision of two hydatid cysts  . MUSCLE BIOPSY  02-25-2007   LEFT THIGH QUADRICEP BX X3  FOR MYOSITIS  . SURG. FOR REMOVAL OF TEETH FRAGMENTS    MARCH 2012   [facility]GE[facility]   Social History   Social History  . Marital status: Single    Spouse name: N/A  . Number of children: N/A  . Years of education: N/A   Social History Main Topics  . Smoking status: Current Every Day Smoker    Packs/day: 0.25    Years: 10.00    Types: Cigarettes    Last attempt to quit: 01/29/2011  . Smokeless tobacco: Never Used     Comment: varies, 0-0.5 pack per day  . Alcohol use 0.0 oz/week     Comment: rare  . Drug use: No  . Sexual activity: Yes    Birth control/ protection: None   Other Topics Concern  . None   Social History Narrative  . None   Allergies  Allergen Reactions  . Latex Hives  . Aleve [Naproxen Sodium] Other (See Comments)    "GETS THE CHILLS"  . Ciprocin-Fluocin-Procin [Fluocinolone Acetonide] Other (See Comments)    GI UPSET AND HEADACHE  . Macrobid [Nitrofurantoin Monohydrate Macrocrystals] Other (See Comments)    GI UPSET; headache  . Yellow Dyes (Non-Tartrazine) Nausea And Vomiting    YELLOW DYE #5    .  Other Rash    [place] JELLY   Family History  Problem Relation Age of Onset  . Diabetes Maternal Grandfather   . Heart disease Maternal Grandfather   . Diabetes Mother   . Hypertension Father   . Hyperlipidemia Father   . Heart disease Father   . Cancer Paternal Uncle     Colon cancer  . Heart disease Maternal Grandmother   . Heart disease Paternal Grandmother   . Stroke Paternal Grandmother   . CAD Paternal Grandmother   . Heart disease Paternal Grandfather   . Diabetes Brother   . Atrial fibrillation Brother     Past medical history, social, surgical and family history all reviewed in electronic medical record.  No pertanent information unless stated regarding to the chief complaint.   Review of Systems: Positive for headache, visual changes, nausea,  vomiting, , weight loss, swollen lymph nodes, body aches, joint swelling, muscle aches, mood changes.  Negative for diarrhea, constipation, dizziness, skin rash, fevers, abdominal pain, chills, night sweats, chest pain, shortness of breath  Objective  There were no vitals taken for this visit.   Systems examined below as of 04/22/16 General: NAD A&O x3 mood, affect normal  HEENT: Pupils equal, extraocular movements intact no nystagmus Respiratory: not short of breath at rest or with speaking Cardiovascular: No lower extremity edema, non tender Skin: Warm dry intact with no signs of infection or rash on extremities or on axial skeleton. Abdomen: Soft nontender, no masses Neuro: Cranial nerves  intact, neurovascularly intact in all extremities with 2+ DTRs and 2+ pulses. Lymph: No lymphadenopathy appreciated today  Gait normal with good balance and coordination.  MSK: Mild tender with full range of motion and good stability and symmetric strength and tone of shoulders, elbows, wrist,  knee hips and ankles bilaterally.   Gait patient does have antalgic gait she also has what appears to be foot drop on the right side. Severe weakness of the hip girdle that causes patient has some mild instability with walking still noted. MSK:  Patient does have axial skeletal weakness especially of the hip abductor's as well as hip flexors. Less foot drop and seen previously.. 4+ out of 5 strength of the right foot. This does show some mild improvement. She doesn't diffuse discomfort to palpation of most of her musculature.   Impression and Recommendations:     This case required medical decision making of moderate complexity.      Note: This dictation was prepared with Dragon dictation along with smaller phrase technology. Any transcriptional errors that result from this process are unintentional.

## 2016-04-24 ENCOUNTER — Other Ambulatory Visit: Payer: Self-pay | Admitting: Gynecology

## 2016-04-24 DIAGNOSIS — E559 Vitamin D deficiency, unspecified: Secondary | ICD-10-CM

## 2016-05-14 ENCOUNTER — Telehealth: Payer: Self-pay | Admitting: *Deleted

## 2016-05-14 DIAGNOSIS — Z01419 Encounter for gynecological examination (general) (routine) without abnormal findings: Secondary | ICD-10-CM

## 2016-05-14 DIAGNOSIS — Z3490 Encounter for supervision of normal pregnancy, unspecified, unspecified trimester: Secondary | ICD-10-CM

## 2016-05-14 DIAGNOSIS — Z13220 Encounter for screening for lipoid disorders: Secondary | ICD-10-CM

## 2016-05-14 NOTE — Telephone Encounter (Signed)
yes

## 2016-05-14 NOTE — Telephone Encounter (Signed)
Order placed, it appears pt never had labs drawn from 04/16/16 visit. Liver function test order were placed this day already. I will only place lipid profile and cmet, serum HCG. I will add on appointment line pt will need 04/16/16 labs done as well.

## 2016-05-14 NOTE — Telephone Encounter (Signed)
Pt scheduled for labs on 05/28/16 for repeat liver function test, Cmet, Lipid profile per office visit on 04/21/16. Pt asked if serum blood test could be orders as well? Pt is sexually active and not using any protection. Okay place serum HCG? Please advise

## 2016-05-19 ENCOUNTER — Telehealth: Payer: Self-pay | Admitting: Family Medicine

## 2016-05-19 NOTE — Telephone Encounter (Signed)
Pt states she had an appt with you today to get fitted for ankle brace. She will not be here. I didn't not see on appt desk.

## 2016-05-28 ENCOUNTER — Other Ambulatory Visit: Payer: Medicare HMO

## 2016-05-28 ENCOUNTER — Ambulatory Visit: Payer: Medicare HMO | Admitting: Gynecology

## 2016-05-28 ENCOUNTER — Encounter: Payer: Self-pay | Admitting: Gynecology

## 2016-06-02 NOTE — Progress Notes (Signed)
[facility] [place], [place] [address] Phone: [phone] Subjective:    I'm seeing this patient by the request  of:  Laurey Morale, MD   CC: Left leg pain f/u  [id number]  Valerie Friedman is a 41 y.o. female coming in with complaint of left leg pain. Past medical history significant for myositis as well as a myotonic dystrophy. Patient is also been diagnosed with fibromyalgia. Patient was seen by me and did have some mild weakness of the left leg. Patient was to have laboratory workup. Labs were reviewed by me and did show the patient had a very low vitamin D. Her on once weekly vitamin D. Patient was also started on gabapentin.Patient was unable to tolerate the gabapentin. Continues to have difficulty with depression as well. Vision states that the chronic pain seems to be unrelenting. Patient states that the vitamin D has not made any significant benefit. Patient was noncompliant previously and now is willing to see neurology. States that she feels that she does need some help. Patient has stopped taking the tramadol at this time as well.    Past Medical History:  Diagnosis Date  . Anxiety   . Chronic lower back pain DUE TO MYOTONIC DYSTROPHY  . Migraine   . Myotonic dystrophy ([place]) [place] 2009  ----- TYPE 1   FOLLOWED BY DR. Erling Cruz  . Numbness of fingers BILATERAL HANDS/  OCCASIONLLY RADIATES UP ARMS  . Pelvic pain in female   . Vitamin D deficiency    Past Surgical History:  Procedure Laterality Date  . BENIGN BREAST BX   FEB  2011  . BREAST BIOPSY Right 2011   Benign Stereo   . LAPAROSCOPY  05/01/2011   Procedure: LAPAROSCOPY DIAGNOSTIC;  Surgeon: Bennetta Laos, MD;  Location: [facility];  Service: Gynecology;  Laterality: N/A;  with bilateral chromopertubation, aspiration of functional left ovarian cyst, excision of two hydatid cysts  . MUSCLE BIOPSY  02-25-2007   LEFT THIGH QUADRICEP BX X3  FOR MYOSITIS    . SURG. FOR REMOVAL OF TEETH FRAGMENTS    MARCH 2012   ORAL SURGEON OFFICE   Social History   Social History  . Marital status: Single    Spouse name: N/A  . Number of children: N/A  . Years of education: N/A   Social History Main Topics  . Smoking status: Current Every Day Smoker    Packs/day: 0.25    Years: 10.00    Types: Cigarettes    Last attempt to quit: 01/29/2011  . Smokeless tobacco: Never Used     Comment: varies, 0-0.5 pack per day  . Alcohol use 0.0 oz/week     Comment: rare  . Drug use: No  . Sexual activity: Yes    Birth control/ protection: None   Other Topics Concern  . Not on file   Social History Narrative  . No narrative on file   Allergies  Allergen Reactions  . Latex Hives  . Aleve [Naproxen Sodium] Other (See Comments)    "GETS THE CHILLS"  . Ciprocin-Fluocin-Procin [Fluocinolone Acetonide] Other (See Comments)    GI UPSET AND HEADACHE  . Macrobid [Nitrofurantoin Monohydrate Macrocrystals] Other (See Comments)    GI UPSET; headache  . Yellow Dyes (Non-Tartrazine) Nausea And Vomiting    YELLOW DYE #5    . Other Rash    [place]   Family History  Problem Relation Age of Onset  .  Diabetes Maternal Grandfather   . Heart disease Maternal Grandfather   . Diabetes Mother   . Hypertension Father   . Hyperlipidemia Father   . Heart disease Father   . Cancer Paternal Uncle        Colon cancer  . Heart disease Maternal Grandmother   . Heart disease Paternal Grandmother   . Stroke Paternal Grandmother   . CAD Paternal Grandmother   . Heart disease Paternal Grandfather   . Diabetes Brother   . Atrial fibrillation Brother     Past medical history, social, surgical and family history all reviewed in electronic medical record.  No pertanent information unless stated regarding to the chief complaint.   Review of Systems: Positive for headache, visual changes, nausea, vomiting, , weight loss, swollen lymph nodes, body aches, joint swelling,  muscle aches, Increasing depression but denies suicidal or homicidal ideation Negative for diarrhea, constipation, dizziness, skin rash, fevers, abdominal pain, chills, night sweats, chest pain, shortness of breath Review of systems were reviewed again as of 06/03/2016 and are accurate.  Objective  Blood pressure 100/72, pulse 80, height 5\' 2"  (1.575 m), weight 122 lb (55.3 kg).   Systems examined below as of 06/03/16 General: NAD A&O x3 mood, affect  flat HEENT: Pupils equal, extraocular movements intact no nystagmus Respiratory: not short of breath at rest or with speaking Cardiovascular: No lower extremity edema, non tender Skin: Warm dry intact with no signs of infection or rash on extremities or on axial skeleton. Abdomen: Soft nontender, no masses Neuro: Cranial nerves  intact, neurovascularly intact in all extremities with 2+ DTRs and 2+ pulses. Lymph: No lymphadenopathy appreciated today  .  Muscle atrophy and some mild weakness noted of multiple muscles. Gait patient does have antalgic gait s Severe weakness of the hip girdle that causes patient has some mild instability with walking  MSK:  Patient does have axial skeletal weakness especially of the hip abductor's as well as hip flexors. Very minimal foot drop noted today. 4+ out of 5 strength of the right foot. Continued improvement in strength She doesn't diffuse discomfort to palpation of most of her musculature and joints and does seem to be out of proportion  Impression and Recommendations:     This case required medical decision making of moderate complexity.      Note: This dictation was prepared with Dragon dictation along with smaller phrase technology. Any transcriptional errors that result from this process are unintentional.

## 2016-06-03 ENCOUNTER — Ambulatory Visit (INDEPENDENT_AMBULATORY_CARE_PROVIDER_SITE_OTHER): Payer: Medicare HMO | Admitting: Family Medicine

## 2016-06-03 ENCOUNTER — Encounter: Payer: Self-pay | Admitting: Family Medicine

## 2016-06-03 VITALS — BP 100/72 | HR 80 | Ht 62.0 in | Wt 122.0 lb

## 2016-06-03 DIAGNOSIS — F32A Depression, unspecified: Secondary | ICD-10-CM

## 2016-06-03 DIAGNOSIS — G7111 Myotonic muscular dystrophy: Secondary | ICD-10-CM | POA: Diagnosis not present

## 2016-06-03 DIAGNOSIS — F329 Major depressive disorder, single episode, unspecified: Secondary | ICD-10-CM | POA: Diagnosis not present

## 2016-06-03 DIAGNOSIS — M21371 Foot drop, right foot: Secondary | ICD-10-CM | POA: Diagnosis not present

## 2016-06-03 DIAGNOSIS — M797 Fibromyalgia: Secondary | ICD-10-CM | POA: Diagnosis not present

## 2016-06-03 DIAGNOSIS — G7100 Muscular dystrophy, unspecified: Secondary | ICD-10-CM

## 2016-06-03 MED ORDER — DULOXETINE HCL 20 MG PO CPEP: 20.0000 mg | ORAL_CAPSULE | Freq: Every day | ORAL | 1 refills | Status: DC

## 2016-06-03 NOTE — Assessment & Plan Note (Signed)
Discussed with patient again at great length. Encourage her to follow up with neurology but I think will be beneficial. Patient has finally agreed to do this. We discussed icing regimen. We will continue the other possible over-the-counter medications we discussed. Patient has not no some any improvement yet.

## 2016-06-03 NOTE — Patient Instructions (Addendum)
Good to see you  I really think Dr. Posey Pronto would be great  Lets try a low dose of cymbalta to help with the pain.  Will take multiple days to start to work.  Keep doing the exercises but do them with the braces for next 2-3 weeks then without thereafter.  See me again in 6 weeks. I may nt have an answer for

## 2016-06-03 NOTE — Assessment & Plan Note (Signed)
Valerie Friedman patient's pain was secondary to fibromyalgia as well as underlying depression and anxiety. Started on Cymbalta that I think will be beneficial. Warned of potential side effects. Patient knows of action plan with any suicidal or homicidal ideation. Any other changes we'll discuss at follow-up which will be close in 4 weeks

## 2016-06-05 ENCOUNTER — Encounter: Payer: Self-pay | Admitting: Neurology

## 2016-06-10 ENCOUNTER — Other Ambulatory Visit: Payer: Medicare HMO

## 2016-06-13 ENCOUNTER — Ambulatory Visit: Payer: Medicare HMO | Admitting: Gynecology

## 2016-06-18 ENCOUNTER — Other Ambulatory Visit: Payer: Medicare HMO

## 2016-06-18 DIAGNOSIS — Z3490 Encounter for supervision of normal pregnancy, unspecified, unspecified trimester: Secondary | ICD-10-CM | POA: Diagnosis not present

## 2016-06-18 DIAGNOSIS — Z13220 Encounter for screening for lipoid disorders: Secondary | ICD-10-CM | POA: Diagnosis not present

## 2016-06-18 DIAGNOSIS — Z01419 Encounter for gynecological examination (general) (routine) without abnormal findings: Secondary | ICD-10-CM

## 2016-06-18 LAB — COMPREHENSIVE METABOLIC PANEL
ALK PHOS: 54 U/L (ref 33–115)
ALT: 28 U/L (ref 6–29)
AST: 27 U/L (ref 10–30)
Albumin: 4.2 g/dL (ref 3.6–5.1)
BILIRUBIN TOTAL: 0.6 mg/dL (ref 0.2–1.2)
BUN: 14 mg/dL (ref 7–25)
CO2: 26 mmol/L (ref 20–31)
CREATININE: 0.71 mg/dL (ref 0.50–1.10)
Calcium: 9.4 mg/dL (ref 8.6–10.2)
Chloride: 109 mmol/L (ref 98–110)
Glucose, Bld: 116 mg/dL — ABNORMAL HIGH (ref 65–99)
Potassium: 4.3 mmol/L (ref 3.5–5.3)
SODIUM: 144 mmol/L (ref 135–146)
Total Protein: 6.7 g/dL (ref 6.1–8.1)

## 2016-06-18 LAB — LIPID PANEL
Cholesterol: 181 mg/dL (ref <200)
HDL: 45 mg/dL — ABNORMAL LOW (ref >50)
LDL Cholesterol: 110 mg/dL — ABNORMAL HIGH (ref <100)
Total CHOL/HDL Ratio: 4 Ratio (ref <5.0)
Triglycerides: 130 mg/dL (ref <150)
VLDL: 26 mg/dL (ref <30)

## 2016-06-19 LAB — HCG, SERUM, QUALITATIVE: Preg, Serum: NEGATIVE

## 2016-06-30 ENCOUNTER — Encounter: Payer: Self-pay | Admitting: Gynecology

## 2016-06-30 ENCOUNTER — Ambulatory Visit (INDEPENDENT_AMBULATORY_CARE_PROVIDER_SITE_OTHER): Payer: Medicare HMO | Admitting: Gynecology

## 2016-06-30 VITALS — BP 110/80

## 2016-06-30 DIAGNOSIS — N920 Excessive and frequent menstruation with regular cycle: Secondary | ICD-10-CM

## 2016-06-30 DIAGNOSIS — Z87891 Personal history of nicotine dependence: Secondary | ICD-10-CM | POA: Diagnosis not present

## 2016-06-30 DIAGNOSIS — Z3009 Encounter for other general counseling and advice on contraception: Secondary | ICD-10-CM

## 2016-06-30 DIAGNOSIS — E559 Vitamin D deficiency, unspecified: Secondary | ICD-10-CM | POA: Diagnosis not present

## 2016-06-30 DIAGNOSIS — R945 Abnormal results of liver function studies: Principal | ICD-10-CM

## 2016-06-30 DIAGNOSIS — R7989 Other specified abnormal findings of blood chemistry: Secondary | ICD-10-CM

## 2016-06-30 MED ORDER — TRANEXAMIC ACID 650 MG PO TABS: 1300.0000 mg | ORAL_TABLET | Freq: Three times a day (TID) | ORAL | 11 refills | Status: DC

## 2016-06-30 NOTE — Progress Notes (Signed)
   Patient is a 41 year old was seen in the office for her annual exam on April 4 of this year. Patient with history of myotonic dystrophy. Patient been a chronic smoker since last visit she said she stopped smoking. She also stop taking the tramadol and her repeat liver function tests at time of her annual exam was back to normal as was her lipid profile CBC and urinalysis. She also had an [facility] checked because she was having occasional hot flashes and her [place] was normal her recent Pap smear was also normal she was found have vitamin D deficiency she has a few more tablets to complete a three-month course whereby she will return to the office for vitamin D level checked at that time and she was instructed she's take vitamin D3 cholecalciferol 2000 units daily after her levels of return back to normal. We discussed different types of contraceptive options she stated her best results as been with the Depo-Provera injection 150 mg IM every 3 months she did not gain any weight in the past and would like to return to this form of contraception.  Patient to return to the office 2 days before the start of her menses since she's going to the [facility] we'll check a urine pregnancy test is negative she'll receive the Depo-Provera injection 150 mg IM. She states she will use condoms during this time. Which she returns back from the [facility] she'll come back so we can check her vitamin D level. Have instructed to begin taking vitamin D3 2000 units daily when she takes her Lasix tablet of vitamin D 50,000 units every weekly.  Greater than 90 percentile was spent counseling [place] care for this patient. Time of consultation 15 minutes

## 2016-07-09 ENCOUNTER — Ambulatory Visit: Payer: Medicare HMO | Admitting: Family Medicine

## 2016-07-12 ENCOUNTER — Other Ambulatory Visit: Payer: Self-pay | Admitting: Family Medicine

## 2016-07-14 NOTE — Telephone Encounter (Signed)
Refill done.  

## 2016-08-29 ENCOUNTER — Ambulatory Visit: Payer: Medicare HMO | Admitting: Neurology

## 2016-10-02 ENCOUNTER — Encounter: Payer: Self-pay | Admitting: Family Medicine

## 2016-11-07 ENCOUNTER — Ambulatory Visit: Payer: Medicare HMO | Admitting: Family Medicine

## 2016-12-15 ENCOUNTER — Ambulatory Visit (INDEPENDENT_AMBULATORY_CARE_PROVIDER_SITE_OTHER): Payer: Medicare HMO | Admitting: Internal Medicine

## 2016-12-15 ENCOUNTER — Encounter: Payer: Self-pay | Admitting: Internal Medicine

## 2016-12-15 VITALS — BP 108/72 | HR 81 | Temp 98.2°F | Wt 143.6 lb

## 2016-12-15 DIAGNOSIS — J040 Acute laryngitis: Secondary | ICD-10-CM | POA: Diagnosis not present

## 2016-12-15 DIAGNOSIS — J310 Chronic rhinitis: Secondary | ICD-10-CM

## 2016-12-15 MED ORDER — BENZONATATE 100 MG PO CAPS: 100.0000 mg | ORAL_CAPSULE | Freq: Three times a day (TID) | ORAL | 1 refills | Status: DC | PRN

## 2016-12-15 NOTE — Progress Notes (Signed)
Chief Complaint  Patient presents with  . Cough    x 4 days. PND, head congestion, sore throat, cough is mostly dry and head pressure. Pt has tried Zyrtec and TheraFlu - little relief. Pt notes vomiting with severe cough spells.     HPI: Valerie Friedman 41 y.o.  sda PCP NA    Here with family member cuse she cant talk well   Hoarse laryngitis     Onset  4 days ago and feels getting worse    Feels  Severe   Was sick cod .  poss fever.     Says has underlying chronic  Nose congestion and sinus     Not using incs cause father  Tells her not to fro risk  Cough bad at night   Dry cough  Post tussive  Emesis at times.  No stridoe no other sick or  Exposure to  Kids   Gets sinus  issues about 2 x per year.   No asthma   Or lung disease .   No  ROS: See pertinent positives and negatives per HPI.  Past Medical History:  Diagnosis Date  . Anxiety   . Chronic lower back pain DUE TO MYOTONIC DYSTROPHY  . Migraine   . Myotonic dystrophy ([place]) Dawn 2009  ----- TYPE 1   FOLLOWED BY DR. Erling Cruz  . Numbness of fingers BILATERAL HANDS/  OCCASIONLLY RADIATES UP ARMS  . Pelvic pain in female   . Vitamin D deficiency     Family History  Problem Relation Age of Onset  . Diabetes Maternal Grandfather   . Heart disease Maternal Grandfather   . Diabetes Mother   . Hypertension Father   . Hyperlipidemia Father   . Heart disease Father   . Cancer Paternal Uncle        Colon cancer  . Heart disease Maternal Grandmother   . Heart disease Paternal Grandmother   . Stroke Paternal Grandmother   . CAD Paternal Grandmother   . Heart disease Paternal Grandfather   . Diabetes Brother   . Atrial fibrillation Brother     Social History   Socioeconomic History  . Marital status: Single    Spouse name: None  . Number of children: None  . Years of education: None  . Highest education level: None  Social Needs  . Financial resource strain: None  . Food insecurity - worry: None  . Food insecurity  - inability: None  . Transportation needs - medical: None  . Transportation needs - non-medical: None  Occupational History  . None  Tobacco Use  . Smoking status: Current Every Day Smoker    Packs/day: 0.25    Years: 10.00    Pack years: 2.50    Types: Cigarettes    Last attempt to quit: 01/29/2011    Years since quitting: 5.8  . Smokeless tobacco: Never Used  . Tobacco comment: varies, 0-0.5 pack per day  Substance and Sexual Activity  . Alcohol use: Yes    Alcohol/week: 0.0 oz    Comment: rare  . Drug use: No  . Sexual activity: Yes    Birth control/protection: None  Other Topics Concern  . None  Social History Narrative  . None    Outpatient Medications Prior to Visit  Medication Sig Dispense Refill  . cetirizine (ZYRTEC) 10 MG tablet Take 10 mg by mouth as needed.     . tranexamic acid (LYSTEDA) 650 MG TABS tablet Take 2 tablets (  1,300 mg total) by mouth 3 (three) times daily. (Patient not taking: Reported on 12/15/2016) 30 tablet 11  . Vitamin D, Ergocalciferol, (DRISDOL) 50000 units CAPS capsule TAKE 1 CAPSULE EVERY 7 DAYS (Patient not taking: Reported on 12/15/2016) 12 capsule 0   No facility-administered medications prior to visit.      EXAM:  BP 108/72 (BP Location: Right Arm, Patient Position: Sitting, Cuff Size: Normal)   Pulse 81   Temp 98.2 F (36.8 C) (Oral)   Wt 143 lb 9.6 oz (65.1 kg)   BMI 26.26 kg/m   Body mass index is 26.26 kg/m.  GENERAL: vitals reviewed and listed above, alert, oriented, appears well hydrated and in no   Hoarse   WDWN in NAD  quiet respirations; mildly congested   Moderately;  no stridor  hoarse. Non toxic . HEENT: Normocephalic ;atraumatic , Eyes;  PERRL, EOMs  Full, lids and conjunctiva clear,,Ears: no deformities, canals nl, TM landmarks normal, Nose: no deformity or discharge but 3+ congested;face minimally to non  tender Mouth : OP clear without lesion or edema .some pnd noted  Neck: Supple without 1+ ac nodes  No pc  nodes( says enlarged  Since had mono in past)  or masses or bruits Chest:  Clear to A without wheezes rales or rhonchi CV:  S1-S2 no gallops or murmurs peripheral perfusion is normal Skin :nl perfusion and no acute rashes  Neuro grossly  NF  ASSESSMENT AND PLAN:  Discussed the following assessment and plan:  Acute laryngitis without obstruction  Chronic rhinitis  Acute viral laryngitis Most likely viral   Disc  Options   No sx of obstructions and indications for antibiotics .   Ok to use delsym,   Best boy   And can add CTm  At night   continue hot tea liquids and honey    Nasal  Hygiene      Total visit 55mins > 50% spent counseling and coordinating care as indicated in above note and in instructions to patient .   -Patient advised to return or notify health care team  if symptoms worsen ,persist or new concerns arise.  Patient Instructions  Nasal saline.   To moisturize the nose    Nasal cortisone if  Good for  chronic sinusitis   Many types of  Sprays.    I think this isi a viral respiratory infection    And possible some sinusitis .  No evidence of pneumonia but if you get  fever chills 102 and above range consider reevaluation .    Voice rest for 2-3 days  Warm liquids   Cough med for comfort . deslym and  Can also .   Helps for comfirt  Hot liquids    If  Severe pain  Fever etc contact us      Laryngitis Laryngitis is inflammation of your vocal cords. This causes hoarseness, coughing, loss of voice, sore throat, or a dry throat. Your vocal cords are two bands of muscles that are found in your throat. When you speak, these cords come together and vibrate. These vibrations come out through your mouth as sound. When your vocal cords are inflamed, your voice sounds different. Laryngitis can be temporary (acute) or long-term (chronic). Most cases of acute laryngitis improve with time. Chronic laryngitis is laryngitis that lasts for more than three weeks. What are the  causes? Acute laryngitis may be caused by:  A viral infection.  Lots of talking, yelling, or singing. This is also  called vocal strain.  Bacterial infections.  Chronic laryngitis may be caused by:  Vocal strain.  Injury to your vocal cords.  Acid reflux (gastroesophageal reflux disease or GERD).  Allergies.  Sinus infection.  Smoking.  Alcohol abuse.  Breathing in chemicals or dust.  Growths on the vocal cords.  What increases the risk? Risk factors for laryngitis include:  Smoking.  Alcohol abuse.  Having allergies.  What are the signs or symptoms? Symptoms of laryngitis may include:  Low, hoarse voice.  Loss of voice.  Dry cough.  Sore throat.  Stuffy nose.  How is this diagnosed? Laryngitis may be diagnosed by:  Physical exam.  Throat culture.  Blood test.  Laryngoscopy. This procedure allows your health care provider to look at your vocal cords with a mirror or viewing tube.  How is this treated? Treatment for laryngitis depends on what is causing it. Usually, treatment involves resting your voice and using medicines to soothe your throat. However, if your laryngitis is caused by a bacterial infection, you may need to take antibiotic medicine. If your laryngitis is caused by a growth, you may need to have a procedure to remove it. Follow these instructions at home:  Drink enough fluid to keep your urine clear or pale yellow.  Breathe in moist air. Use a humidifier if you live in a dry climate.  Take medicines only as directed by your health care provider.  If you were prescribed an antibiotic medicine, finish it all even if you start to feel better.  Do not smoke cigarettes or electronic cigarettes. If you need help quitting, ask your health care provider.  Talk as little as possible. Also avoid whispering, which can cause vocal strain.  Write instead of talking. Do this until your voice is back to normal. Contact a health care  provider if:  You have a fever.  You have increasing pain.  You have difficulty swallowing. Get help right away if:  You cough up blood.  You have trouble breathing. This information is not intended to replace advice given to you by your health care provider. Make sure you discuss any questions you have with your health care provider. Document Released: 12/30/2004 Document Revised: 06/07/2015 Document Reviewed: 06/14/2013 Elsevier Interactive Patient Education  2018 [place]. Ranelle Auker M.D.

## 2016-12-15 NOTE — Patient Instructions (Addendum)
Nasal saline.   To moisturize the nose    Nasal cortisone if  Good for  chronic sinusitis   Many types of  Sprays.    I think this isi a viral respiratory infection    And possible some sinusitis .  No evidence of pneumonia but if you get  fever chills 102 and above range consider reevaluation .    Voice rest for 2-3 days  Warm liquids   Cough med for comfort . deslym and  Can also .   Helps for comfirt  Hot liquids    If  Severe pain  Fever etc contact us      Laryngitis Laryngitis is inflammation of your vocal cords. This causes hoarseness, coughing, loss of voice, sore throat, or a dry throat. Your vocal cords are two bands of muscles that are found in your throat. When you speak, these cords come together and vibrate. These vibrations come out through your mouth as sound. When your vocal cords are inflamed, your voice sounds different. Laryngitis can be temporary (acute) or long-term (chronic). Most cases of acute laryngitis improve with time. Chronic laryngitis is laryngitis that lasts for more than three weeks. What are the causes? Acute laryngitis may be caused by:  A viral infection.  Lots of talking, yelling, or singing. This is also called vocal strain.  Bacterial infections.  Chronic laryngitis may be caused by:  Vocal strain.  Injury to your vocal cords.  Acid reflux (gastroesophageal reflux disease or GERD).  Allergies.  Sinus infection.  Smoking.  Alcohol abuse.  Breathing in chemicals or dust.  Growths on the vocal cords.  What increases the risk? Risk factors for laryngitis include:  Smoking.  Alcohol abuse.  Having allergies.  What are the signs or symptoms? Symptoms of laryngitis may include:  Low, hoarse voice.  Loss of voice.  Dry cough.  Sore throat.  Stuffy nose.  How is this diagnosed? Laryngitis may be diagnosed by:  Physical exam.  Throat culture.  Blood test.  Laryngoscopy. This procedure allows your health care  provider to look at your vocal cords with a mirror or viewing tube.  How is this treated? Treatment for laryngitis depends on what is causing it. Usually, treatment involves resting your voice and using medicines to soothe your throat. However, if your laryngitis is caused by a bacterial infection, you may need to take antibiotic medicine. If your laryngitis is caused by a growth, you may need to have a procedure to remove it. Follow these instructions at home:  Drink enough fluid to keep your urine clear or pale yellow.  Breathe in moist air. Use a humidifier if you live in a dry climate.  Take medicines only as directed by your health care provider.  If you were prescribed an antibiotic medicine, finish it all even if you start to feel better.  Do not smoke cigarettes or electronic cigarettes. If you need help quitting, ask your health care provider.  Talk as little as possible. Also avoid whispering, which can cause vocal strain.  Write instead of talking. Do this until your voice is back to normal. Contact a health care provider if:  You have a fever.  You have increasing pain.  You have difficulty swallowing. Get help right away if:  You cough up blood.  You have trouble breathing. This information is not intended to replace advice given to you by your health care provider. Make sure you discuss any questions you have with your health  care provider. Document Released: 12/30/2004 Document Revised: 06/07/2015 Document Reviewed: 06/14/2013 Elsevier Interactive Patient Education  [employer].

## 2016-12-16 ENCOUNTER — Ambulatory Visit: Payer: Medicare HMO | Admitting: Internal Medicine

## 2017-01-01 ENCOUNTER — Ambulatory Visit (INDEPENDENT_AMBULATORY_CARE_PROVIDER_SITE_OTHER): Payer: Medicare HMO | Admitting: Neurology

## 2017-01-01 ENCOUNTER — Encounter: Payer: Self-pay | Admitting: Neurology

## 2017-01-01 ENCOUNTER — Other Ambulatory Visit: Payer: Medicare HMO

## 2017-01-01 VITALS — BP 124/82 | HR 99 | Ht 63.0 in | Wt 143.0 lb

## 2017-01-01 DIAGNOSIS — Z9189 Other specified personal risk factors, not elsewhere classified: Secondary | ICD-10-CM | POA: Diagnosis not present

## 2017-01-01 DIAGNOSIS — G4733 Obstructive sleep apnea (adult) (pediatric): Secondary | ICD-10-CM

## 2017-01-01 DIAGNOSIS — Z131 Encounter for screening for diabetes mellitus: Secondary | ICD-10-CM

## 2017-01-01 DIAGNOSIS — G7111 Myotonic muscular dystrophy: Secondary | ICD-10-CM

## 2017-01-01 DIAGNOSIS — Z833 Family history of diabetes mellitus: Secondary | ICD-10-CM | POA: Diagnosis not present

## 2017-01-01 NOTE — Progress Notes (Signed)
[facility] Clinic Note - Initial Visit   Date: 01/01/17  Valerie Friedman MRN: [id number] DOB: 25-Sep-1975   Dear Dr. Tamala Julian:  Thank you for your kind referral of Valerie Friedman for consultation of myotonic dystrophy. Although her history is well known to you, please allow [place] to reiterate it for the purpose of our medical record. The patient was accompanied to the clinic by mother who also provides collateral information.     History of Present Illness: Valerie Friedman is a 41 y.o. right-handed Caucasian female with myotonic dystrophy type I (diagnosed 2009), and depression presenting to establish care for myotonic dystrophy.  Her brother is also a patient of mine for myotonic dystriphy.   Starting around her late 41s, she began having severe low back pain and was evaluated by various providers and eventually saw Dr. Erling Cruz in 2009, who had very high clinical suspicion for myotonic dystrophy by her facial features and exam.  Her genetic testing confirmed myotonic dystrophy type 1.  She was briefly followed by Dr. Erling Cruz and later discharged, as she did not comply with recommendations, namely being treated for OSA. She has not been followed by a neurologist since this time and has not had any cardiac screening.  She saw Dr. Burt Knack once who recommended echocardiogram, which she did not follow-up with.  She denies any palpitations or chest discomfort. She admits to being strong minded and does not follow through with recommendations.  She only came to this visit after repeated encouragement by her brother, who also sees me.   She is concerned about her health now that she is dating her boyfriend and he has a 28-month old child.  She stopped smoking this summer and is interested in optimizing her strength, so she can engage with the baby.  This week, she developed right shoulder pain, which is dull and deep to the joint.  This started after lifting her the baby.  She  has not treated this with any medications.    She had distal weakness of the feet worse on the right and uses a AFO.  Her left knee gives out and using a brace helps.  She denies any changes of speech, swallowing, or shortness of breath. She endorse mild blurred vision and is overdue for eye exam. Her hands can cramp easily and take time to relax.  She has some weakness with opening jars and bottles. She walks unassisted and falls once every 2-3 months because of tripping on her foot or knee buckling.   She has a lot of ongoing myalgias involving the arms, legs, and back.  She has tried a number of treatment modalities without any relief and was seeing Dr. Tamala Julian for this.  She is no longer taking tramadol. She is diagnosed with OSA, but cannot use a CPAP because of claustrophobia.  She complains of morning headaches and waking up gasping for air at night. She is fatigued during the day, and sleeps more during the day than at night.  She is in a relationship and does not have any plans for future pregnancy.  She is aware that myotonic dystrophy is hereditary and a dominant gene mutation, making 50% of her offspring at risk to develop this.    Her brother has complications including early cataracts, diabetes, and cardiac arrhythmias s/p several ablations.    Out-side paper records, electronic medical record, and images have been reviewed where available and summarized as:  Lab Results  Component Value Date  HGBA1C 5.9 04/17/2006   Lab Results  Component Value Date   VITAMINB12 721 03/26/2016   Lab Results  Component Value Date   ESRSEDRATE 21 (H) 03/26/2016   Lab Results  Component Value Date   CKTOTAL 82 03/26/2016    Past Medical History:  Diagnosis Date  . Anxiety   . Chronic lower back pain DUE TO MYOTONIC DYSTROPHY  . Migraine   . Myotonic dystrophy ([place]) [place] 2009  ----- TYPE 1   FOLLOWED BY DR. Erling Cruz  . Numbness of fingers BILATERAL HANDS/  OCCASIONLLY RADIATES UP ARMS  .  Pelvic pain in female   . Vitamin D deficiency     Past Surgical History:  Procedure Laterality Date  . BENIGN BREAST BX   FEB  2011  . BREAST BIOPSY Right 2011   Benign Stereo   . LAPAROSCOPY  05/01/2011   Procedure: LAPAROSCOPY DIAGNOSTIC;  Surgeon: Bennetta Laos, MD;  Location: [facility];  Service: Gynecology;  Laterality: N/A;  with bilateral chromopertubation, aspiration of functional left ovarian cyst, excision of two hydatid cysts  . MUSCLE BIOPSY  02-25-2007   LEFT THIGH QUADRICEP BX X3  FOR MYOSITIS  . SURG. FOR REMOVAL OF TEETH FRAGMENTS    MARCH 2012   ORAL SURGEON OFFICE     Medications:  Outpatient Encounter Medications as of 01/01/2017  Medication Sig  . benzonatate (TESSALON) 100 MG capsule Take 1 capsule (100 mg total) by mouth 3 (three) times daily as needed for cough.  . cetirizine (ZYRTEC) 10 MG tablet Take 10 mg by mouth as needed.    No facility-administered encounter medications on file as of 01/01/2017.      Allergies:  Allergies  Allergen Reactions  . Latex Hives  . Aleve [Naproxen Sodium] Other (See Comments)    "GETS THE CHILLS"  . Ciprocin-Fluocin-Procin [Fluocinolone Acetonide] Other (See Comments)    GI UPSET AND HEADACHE  . Macrobid [Nitrofurantoin Monohydrate Macrocrystals] Other (See Comments)    GI UPSET; headache  . Yellow Dyes (Non-Tartrazine) Nausea And Vomiting    YELLOW DYE #5    . Other Rash    [place] JELLY    Family History: Family History  Problem Relation Age of Onset  . Diabetes Maternal Grandfather   . Heart disease Maternal Grandfather   . Diabetes Mother   . Hypertension Father   . Hyperlipidemia Father   . Heart disease Father   . Cancer Paternal Uncle        Colon cancer  . Parkinsonism Paternal Uncle   . Heart disease Maternal Grandmother   . Heart disease Paternal Grandmother   . Stroke Paternal Grandmother   . CAD Paternal Grandmother   . Heart disease Paternal Grandfather   . Diabetes  Brother   . Atrial fibrillation Brother     Social History: Social History   Tobacco Use  . Smoking status: Former Smoker    Packs/day: 0.25    Years: 10.00    Pack years: 2.50    Types: Cigarettes    Last attempt to quit: 06/28/2016    Years since quitting: 0.5  . Smokeless tobacco: Never Used  . Tobacco comment: varies, 0-0.5 pack per day  Substance Use Topics  . Alcohol use: Yes    Alcohol/week: 0.0 oz    Comment: rare  . Drug use: No   Social History   Social History Narrative   Pt lives in 1 story home with her parents and brother   Some  college   On disability    Review of Systems:  CONSTITUTIONAL: No fevers, chills, night sweats, or weight loss.   EYES: No visual changes or eye pain ENT: No hearing changes.  No history of nose bleeds.   RESPIRATORY: No cough, wheezing and shortness of breath.   CARDIOVASCULAR: Negative for chest pain, and palpitations.   GI: Negative for abdominal discomfort, blood in stools or black stools.  No recent change in bowel habits.   GU:  No history of incontinence.   MUSCLOSKELETAL: +history of joint pain or swelling.  +myalgias.   SKIN: Negative for lesions, rash, and itching.   HEMATOLOGY/ONCOLOGY: Negative for prolonged bleeding, bruising easily, and swollen nodes.  No history of cancer.   ENDOCRINE: Negative for cold or heat intolerance, polydipsia or goiter.   PSYCH:  +depression or anxiety symptoms.   NEURO: As Above.   Vital Signs:  BP 124/82   Pulse 99   Ht 5\' 3"  (1.6 m)   Wt 143 lb (64.9 kg)   SpO2 93%   BMI 25.33 kg/m    General Medical Exam:   General:  Well appearing, comfortable, elongated face with moderate temporal wasting.   Eyes/ENT: see cranial nerve examination.   Neck: No masses appreciated.  Full range of motion without tenderness.  No carotid bruits. Respiratory:  Clear to auscultation, good air entry bilaterally.   Cardiac:  Regular rate and rhythm, no murmur.   Extremities:  No deformities, edema,  or skin discoloration.  Skin:  No rashes or lesions.  Neurological Exam: MENTAL STATUS including orientation to time, place, person, recent and remote memory, attention span and concentration, language, and fund of knowledge is normal.  Speech is not dysarthric.  CRANIAL NERVES: II:  No visual field defects.  Unremarkable fundi.  Mild lens opacity on the right. III-IV-VI: Pupils equal round and reactive to light.  Normal conjugate, extra-ocular eye movements in all directions of gaze.  No nystagmus.  No ptosis.   V:  Normal facial sensation.     VII:  She has a transverse smile and there is mild weakness of the facial muscles - frontalis, orbicularis oculi, buccinator, and orbicularis oris is 4/5.    VIII:  Normal hearing and vestibular function.   IX-X:  Normal palatal movement.   XI:  Normal shoulder shrug and head rotation.   XII:  Normal tongue strength and range of motion, no deviation or fasciculation.  MOTOR:  Mild reduced muscle bulk in the intrinsic and muscles and bilateral TA atriphy.  No fasiculations or abnormal movements. There is percussion myotonia at the ABP and grip myotonia.  No pronator drift.  Tone is normal.    Right Upper Extremity:    Left Upper Extremity:    Deltoid  5/5   Deltoid  5/5   Biceps  5/5   Biceps  5/5   Triceps  5/5   Triceps  5/5   Wrist extensors  5/5   Wrist extensors  5/5   Wrist flexors  5/5   Wrist flexors  5/5   Finger extensors  4+/5   Finger extensors  4+/5   Finger flexors  4+/5   Finger flexors  4+/5   Dorsal interossei  4+/5   Dorsal interossei  4+/5   Abductor pollicis  4+/5   Abductor pollicis  4+/5   Tone (Ashworth scale)  0  Tone (Ashworth scale)  0   Right Lower Extremity:    Left Lower Extremity:    Hip  flexors  5/5   Hip flexors  5/5   Hip extensors  5/5   Hip extensors  5/5   Knee flexors  5/5   Knee flexors  5/5   Knee extensors  5/5   Knee extensors  5/5   Dorsiflexors  5/5   Dorsiflexors  5/5   Plantarflexors  4/5    Plantarflexors  4/5   Toe extensors  4/5   Toe extensors  4/5   Toe flexors  4/5   Toe flexors  4/5   Tone (Ashworth scale)  0  Tone (Ashworth scale)  0   MSRs:  Right                                                                 Left brachioradialis 2+  brachioradialis 2+  biceps 2+  biceps 2+  triceps 2+  triceps 2+  patellar 2+  patellar 2+  ankle jerk 0  ankle jerk 0  Hoffman no  Hoffman no  plantar response down  plantar response down   SENSORY:  Normal and symmetric perception of light touch, pinprick, vibration, and proprioception.    COORDINATION/GAIT: Mild dysmetria with finger-to- nose-finger testing on the right only.  Intact rapid alternating movements bilaterally.   Gait shows mild steppage bilaterally, unassisted and stable.  She is able to perform tandem gait but unable to stand on heels or toes.   IMPRESSION: Valerie Friedman is a 41 year-old female with myotonic dystrophy type I (diagnosed 2009 by genetic testing) who is referred to establish care.  She has classic exam findings for DM1 including elongated face, temporal wasting, mild facial weakness, myotonia and distal weakness.  She has not had any recent surveillance for complications of myotonic dystrophy such as diabetes, cataracts, or cardiac arrhythmias so will update this. For her distal weakness, I have stressed the importance of low intensity exercise.  She is very sedentary and is not motivated to stay active. She is interested in starting physical therapy for deconditioning and leg strengthening.   Annual EKG, referral to cardiology for ongoing evaluation for cardiac manifestations of myotonic dystrophy.  She reports seeing Dr. Burt Knack in the past. Annual screening for diabetes, check HbA1c Annual dilated eye exam to evaluate for cataracts  She has history of untreated OSA, recommend seeing pulmonology for further evaluation and management.  She does not wish to undergo sleep evaluation at a sleep lab, but would  consider in-home testing. It was explained that sleep evaluation would be determined by her pulmonologist.  She was informed that OSA is an risk factor for cardiac disease, given that she is already at risk from myotonic dystrophy, strongly recommend compliance with therapies.  She had many questions which were answered to the best of my ability.  Return to clinic in 6 months.   The duration of this appointment visit was 60 minutes of face-to-face time with the patient.  Greater than 50% of this time was spent in counseling, explanation of diagnosis, planning of further management, and coordination of care.   Thank you for allowing me to participate in patient's care.  If I can answer any additional questions, I would be pleased to do so.    Sincerely,    Donika K. Posey Pronto, DO

## 2017-01-01 NOTE — Patient Instructions (Addendum)
1.  Referral to cardiology  2.  Referral to pulmonology 3.  Referral to physical therapy for leg strengthening 4.  Screen for diabetes 5.  Recommend annual eye exam  Return to clinic in 6 months

## 2017-01-02 LAB — HEMOGLOBIN A1C
EAG (MMOL/L): 6.6 (calc)
HEMOGLOBIN A1C: 5.8 %{Hb} — AB (ref <5.7)
Mean Plasma Glucose: 120 (calc)

## 2017-01-05 ENCOUNTER — Other Ambulatory Visit: Payer: Self-pay | Admitting: *Deleted

## 2017-01-05 DIAGNOSIS — G4733 Obstructive sleep apnea (adult) (pediatric): Secondary | ICD-10-CM

## 2017-01-05 DIAGNOSIS — G7111 Myotonic muscular dystrophy: Secondary | ICD-10-CM

## 2017-01-05 NOTE — Progress Notes (Signed)
Orders placed and will fax [facility] order.

## 2017-01-15 DIAGNOSIS — M545 Low back pain: Secondary | ICD-10-CM | POA: Diagnosis not present

## 2017-01-15 DIAGNOSIS — R262 Difficulty in walking, not elsewhere classified: Secondary | ICD-10-CM | POA: Diagnosis not present

## 2017-01-15 DIAGNOSIS — M6281 Muscle weakness (generalized): Secondary | ICD-10-CM | POA: Diagnosis not present

## 2017-01-19 DIAGNOSIS — M6281 Muscle weakness (generalized): Secondary | ICD-10-CM | POA: Diagnosis not present

## 2017-01-19 DIAGNOSIS — R262 Difficulty in walking, not elsewhere classified: Secondary | ICD-10-CM | POA: Diagnosis not present

## 2017-01-19 DIAGNOSIS — M545 Low back pain: Secondary | ICD-10-CM | POA: Diagnosis not present

## 2017-01-21 DIAGNOSIS — R262 Difficulty in walking, not elsewhere classified: Secondary | ICD-10-CM | POA: Diagnosis not present

## 2017-01-21 DIAGNOSIS — M545 Low back pain: Secondary | ICD-10-CM | POA: Diagnosis not present

## 2017-01-21 DIAGNOSIS — M6281 Muscle weakness (generalized): Secondary | ICD-10-CM | POA: Diagnosis not present

## 2017-01-23 DIAGNOSIS — M6281 Muscle weakness (generalized): Secondary | ICD-10-CM | POA: Diagnosis not present

## 2017-01-23 DIAGNOSIS — R262 Difficulty in walking, not elsewhere classified: Secondary | ICD-10-CM | POA: Diagnosis not present

## 2017-01-23 DIAGNOSIS — M545 Low back pain: Secondary | ICD-10-CM | POA: Diagnosis not present

## 2017-01-26 DIAGNOSIS — M545 Low back pain: Secondary | ICD-10-CM | POA: Diagnosis not present

## 2017-01-26 DIAGNOSIS — R262 Difficulty in walking, not elsewhere classified: Secondary | ICD-10-CM | POA: Diagnosis not present

## 2017-01-26 DIAGNOSIS — M6281 Muscle weakness (generalized): Secondary | ICD-10-CM | POA: Diagnosis not present

## 2017-01-28 DIAGNOSIS — R262 Difficulty in walking, not elsewhere classified: Secondary | ICD-10-CM | POA: Diagnosis not present

## 2017-01-28 DIAGNOSIS — M545 Low back pain: Secondary | ICD-10-CM | POA: Diagnosis not present

## 2017-01-28 DIAGNOSIS — M6281 Muscle weakness (generalized): Secondary | ICD-10-CM | POA: Diagnosis not present

## 2017-01-30 DIAGNOSIS — M545 Low back pain: Secondary | ICD-10-CM | POA: Diagnosis not present

## 2017-01-30 DIAGNOSIS — R262 Difficulty in walking, not elsewhere classified: Secondary | ICD-10-CM | POA: Diagnosis not present

## 2017-01-30 DIAGNOSIS — M6281 Muscle weakness (generalized): Secondary | ICD-10-CM | POA: Diagnosis not present

## 2017-02-02 DIAGNOSIS — M6281 Muscle weakness (generalized): Secondary | ICD-10-CM | POA: Diagnosis not present

## 2017-02-02 DIAGNOSIS — M545 Low back pain: Secondary | ICD-10-CM | POA: Diagnosis not present

## 2017-02-02 DIAGNOSIS — R262 Difficulty in walking, not elsewhere classified: Secondary | ICD-10-CM | POA: Diagnosis not present

## 2017-02-04 DIAGNOSIS — R262 Difficulty in walking, not elsewhere classified: Secondary | ICD-10-CM | POA: Diagnosis not present

## 2017-02-04 DIAGNOSIS — M6281 Muscle weakness (generalized): Secondary | ICD-10-CM | POA: Diagnosis not present

## 2017-02-04 DIAGNOSIS — M545 Low back pain: Secondary | ICD-10-CM | POA: Diagnosis not present

## 2017-02-06 ENCOUNTER — Ambulatory Visit: Payer: Medicare HMO | Admitting: Neurology

## 2017-02-09 DIAGNOSIS — R262 Difficulty in walking, not elsewhere classified: Secondary | ICD-10-CM | POA: Diagnosis not present

## 2017-02-09 DIAGNOSIS — M545 Low back pain: Secondary | ICD-10-CM | POA: Diagnosis not present

## 2017-02-09 DIAGNOSIS — M6281 Muscle weakness (generalized): Secondary | ICD-10-CM | POA: Diagnosis not present

## 2017-02-11 DIAGNOSIS — M6281 Muscle weakness (generalized): Secondary | ICD-10-CM | POA: Diagnosis not present

## 2017-02-11 DIAGNOSIS — R262 Difficulty in walking, not elsewhere classified: Secondary | ICD-10-CM | POA: Diagnosis not present

## 2017-02-11 DIAGNOSIS — M545 Low back pain: Secondary | ICD-10-CM | POA: Diagnosis not present

## 2017-02-13 DIAGNOSIS — M545 Low back pain: Secondary | ICD-10-CM | POA: Diagnosis not present

## 2017-02-13 DIAGNOSIS — M6281 Muscle weakness (generalized): Secondary | ICD-10-CM | POA: Diagnosis not present

## 2017-02-13 DIAGNOSIS — R262 Difficulty in walking, not elsewhere classified: Secondary | ICD-10-CM | POA: Diagnosis not present

## 2017-02-16 DIAGNOSIS — R262 Difficulty in walking, not elsewhere classified: Secondary | ICD-10-CM | POA: Diagnosis not present

## 2017-02-16 DIAGNOSIS — M545 Low back pain: Secondary | ICD-10-CM | POA: Diagnosis not present

## 2017-02-16 DIAGNOSIS — M6281 Muscle weakness (generalized): Secondary | ICD-10-CM | POA: Diagnosis not present

## 2017-02-20 DIAGNOSIS — R262 Difficulty in walking, not elsewhere classified: Secondary | ICD-10-CM | POA: Diagnosis not present

## 2017-02-20 DIAGNOSIS — M545 Low back pain: Secondary | ICD-10-CM | POA: Diagnosis not present

## 2017-02-20 DIAGNOSIS — M6281 Muscle weakness (generalized): Secondary | ICD-10-CM | POA: Diagnosis not present

## 2017-02-23 DIAGNOSIS — M6281 Muscle weakness (generalized): Secondary | ICD-10-CM | POA: Diagnosis not present

## 2017-02-23 DIAGNOSIS — R262 Difficulty in walking, not elsewhere classified: Secondary | ICD-10-CM | POA: Diagnosis not present

## 2017-02-23 DIAGNOSIS — M545 Low back pain: Secondary | ICD-10-CM | POA: Diagnosis not present

## 2017-02-25 DIAGNOSIS — M545 Low back pain: Secondary | ICD-10-CM | POA: Diagnosis not present

## 2017-02-25 DIAGNOSIS — R262 Difficulty in walking, not elsewhere classified: Secondary | ICD-10-CM | POA: Diagnosis not present

## 2017-02-25 DIAGNOSIS — M6281 Muscle weakness (generalized): Secondary | ICD-10-CM | POA: Diagnosis not present

## 2017-02-27 DIAGNOSIS — R262 Difficulty in walking, not elsewhere classified: Secondary | ICD-10-CM | POA: Diagnosis not present

## 2017-02-27 DIAGNOSIS — M545 Low back pain: Secondary | ICD-10-CM | POA: Diagnosis not present

## 2017-02-27 DIAGNOSIS — M6281 Muscle weakness (generalized): Secondary | ICD-10-CM | POA: Diagnosis not present

## 2017-03-04 DIAGNOSIS — R262 Difficulty in walking, not elsewhere classified: Secondary | ICD-10-CM | POA: Diagnosis not present

## 2017-03-04 DIAGNOSIS — M6281 Muscle weakness (generalized): Secondary | ICD-10-CM | POA: Diagnosis not present

## 2017-03-04 DIAGNOSIS — M545 Low back pain: Secondary | ICD-10-CM | POA: Diagnosis not present

## 2017-03-05 DIAGNOSIS — R262 Difficulty in walking, not elsewhere classified: Secondary | ICD-10-CM | POA: Diagnosis not present

## 2017-03-05 DIAGNOSIS — M6281 Muscle weakness (generalized): Secondary | ICD-10-CM | POA: Diagnosis not present

## 2017-03-05 DIAGNOSIS — M545 Low back pain: Secondary | ICD-10-CM | POA: Diagnosis not present

## 2017-03-10 DIAGNOSIS — M545 Low back pain: Secondary | ICD-10-CM | POA: Diagnosis not present

## 2017-03-10 DIAGNOSIS — M6281 Muscle weakness (generalized): Secondary | ICD-10-CM | POA: Diagnosis not present

## 2017-03-10 DIAGNOSIS — R262 Difficulty in walking, not elsewhere classified: Secondary | ICD-10-CM | POA: Diagnosis not present

## 2017-03-17 DIAGNOSIS — R262 Difficulty in walking, not elsewhere classified: Secondary | ICD-10-CM | POA: Diagnosis not present

## 2017-03-17 DIAGNOSIS — M545 Low back pain: Secondary | ICD-10-CM | POA: Diagnosis not present

## 2017-03-17 DIAGNOSIS — M6281 Muscle weakness (generalized): Secondary | ICD-10-CM | POA: Diagnosis not present

## 2017-03-20 DIAGNOSIS — M6281 Muscle weakness (generalized): Secondary | ICD-10-CM | POA: Diagnosis not present

## 2017-03-20 DIAGNOSIS — M545 Low back pain: Secondary | ICD-10-CM | POA: Diagnosis not present

## 2017-03-20 DIAGNOSIS — R262 Difficulty in walking, not elsewhere classified: Secondary | ICD-10-CM | POA: Diagnosis not present

## 2017-03-24 DIAGNOSIS — M545 Low back pain: Secondary | ICD-10-CM | POA: Diagnosis not present

## 2017-03-24 DIAGNOSIS — R262 Difficulty in walking, not elsewhere classified: Secondary | ICD-10-CM | POA: Diagnosis not present

## 2017-03-24 DIAGNOSIS — M6281 Muscle weakness (generalized): Secondary | ICD-10-CM | POA: Diagnosis not present

## 2017-03-27 DIAGNOSIS — M545 Low back pain: Secondary | ICD-10-CM | POA: Diagnosis not present

## 2017-03-27 DIAGNOSIS — M6281 Muscle weakness (generalized): Secondary | ICD-10-CM | POA: Diagnosis not present

## 2017-03-27 DIAGNOSIS — R262 Difficulty in walking, not elsewhere classified: Secondary | ICD-10-CM | POA: Diagnosis not present

## 2017-03-31 DIAGNOSIS — R262 Difficulty in walking, not elsewhere classified: Secondary | ICD-10-CM | POA: Diagnosis not present

## 2017-03-31 DIAGNOSIS — M6281 Muscle weakness (generalized): Secondary | ICD-10-CM | POA: Diagnosis not present

## 2017-03-31 DIAGNOSIS — M545 Low back pain: Secondary | ICD-10-CM | POA: Diagnosis not present

## 2017-04-03 DIAGNOSIS — M6281 Muscle weakness (generalized): Secondary | ICD-10-CM | POA: Diagnosis not present

## 2017-04-03 DIAGNOSIS — M545 Low back pain: Secondary | ICD-10-CM | POA: Diagnosis not present

## 2017-04-03 DIAGNOSIS — R262 Difficulty in walking, not elsewhere classified: Secondary | ICD-10-CM | POA: Diagnosis not present

## 2017-04-06 ENCOUNTER — Encounter: Payer: Self-pay | Admitting: Neurology

## 2017-04-06 ENCOUNTER — Ambulatory Visit (INDEPENDENT_AMBULATORY_CARE_PROVIDER_SITE_OTHER): Payer: Medicare HMO | Admitting: Neurology

## 2017-04-06 VITALS — BP 110/70 | HR 80 | Ht 63.0 in

## 2017-04-06 DIAGNOSIS — G7111 Myotonic muscular dystrophy: Secondary | ICD-10-CM

## 2017-04-06 NOTE — Patient Instructions (Addendum)
Start occupational therapy  Recommend annual eye exam   Cardiology referral.  [phone]  Return to clinic in 9 months

## 2017-04-06 NOTE — Progress Notes (Signed)
Follow-up Visit   Date: 04/06/17    Valerie Friedman MRN: [id number] DOB: 1975/03/12   Interim History: Valerie Friedman is a 42 y.o. right-handed Caucasian female with myotonic dystrophy type I (diagnosed 2009), and depression returning to the clinic for follow-up of myotonic dystrophy.  The patient was accompanied to the clinic by mother who also provides collateral information.    History of present illness: Starting around her late 75s, she began having severe low back pain and was evaluated by various providers and eventually saw Dr. Erling Cruz in 2009, who had very high clinical suspicion for myotonic dystrophy by her facial features and exam.  Her genetic testing confirmed myotonic dystrophy type 1.  She was briefly followed by Dr. Erling Cruz and later discharged, as she did not comply with recommendations, namely being treated for OSA. She has not been followed by a neurologist since this time and has not had any cardiac screening.  She saw Dr. Burt Knack once who recommended echocardiogram, which she did not follow-up with.  She denies any palpitations or chest discomfort. She admits to being strong minded and does not follow through with recommendations.  She only came to this visit after repeated encouragement by her brother, who also sees me.   She is concerned about her health now that she is dating her boyfriend and he has a 4-month old child.  She stopped smoking this summer and is interested in optimizing her strength, so she can engage with the baby.  This week, she developed right shoulder pain, which is dull and deep to the joint.  This started after lifting her the baby.  She has not treated this with any medications.    She had distal weakness of the feet worse on the right and uses a AFO.  Her left knee gives out and using a brace helps.  She denies any changes of speech, swallowing, or shortness of breath. She endorse mild blurred vision and is overdue for eye exam. Her hands can  cramp easily and take time to relax.  She has some weakness with opening jars and bottles. She walks unassisted and falls once every 2-3 months because of tripping on her foot or knee buckling.   She has a lot of ongoing myalgias involving the arms, legs, and back.  She has tried a number of treatment modalities without any relief and was seeing Dr. Tamala Julian for this.  She is no longer taking tramadol. She is diagnosed with OSA, but cannot use a CPAP because of claustrophobia.  She complains of morning headaches and waking up gasping for air at night. She is fatigued during the day, and sleeps more during the day than at night.  She is in a relationship and does not have any plans for future pregnancy.  She is aware that myotonic dystrophy is hereditary and a dominant gene mutation, making 50% of her offspring at risk to develop this.    Her brother has complications including early cataracts, diabetes, and cardiac arrhythmias s/p several ablations.    UPDATE 04/06/2017:   She is here for follow-up visit.   She has benefited from physical physical and has suffered only one fall since going there.  She is going 2-3 times per week.  She would like to start occupational therapy for her arm weakness.  Her last HbA1c did not show diabetes.  She has not seen cardiology or eye doctor.   Medications:  Current Outpatient Medications on File Prior to Visit  Medication Sig Dispense Refill  . benzonatate (TESSALON) 100 MG capsule Take 1 capsule (100 mg total) by mouth 3 (three) times daily as needed for cough. 20 capsule 1  . cetirizine (ZYRTEC) 10 MG tablet Take 10 mg by mouth as needed.     . [DISCONTINUED] isometheptene-acetaminophen-dichloralphenazone (MIDRIN) 65-325-100 MG per capsule Take 1 capsule by mouth 4 (four) times daily as needed. Prn      No current facility-administered medications on file prior to visit.     Allergies:  Allergies  Allergen Reactions  . Latex Hives  . Aleve [Naproxen Sodium]  Other (See Comments)    "GETS THE CHILLS"  . Ciprocin-Fluocin-Procin [Fluocinolone Acetonide] Other (See Comments)    GI UPSET AND HEADACHE  . Macrobid [Nitrofurantoin Monohydrate Macrocrystals] Other (See Comments)    GI UPSET; headache  . Yellow Dyes (Non-Tartrazine) Nausea And Vomiting    YELLOW DYE #5    . Other Rash    [place] JELLY    Review of Systems:  CONSTITUTIONAL: No fevers, chills, night sweats, or weight loss.  EYES: No visual changes or eye pain ENT: No hearing changes.  No history of nose bleeds.   RESPIRATORY: No cough, wheezing and shortness of breath.   CARDIOVASCULAR: Negative for chest pain, and palpitations.   GI: Negative for abdominal discomfort, blood in stools or black stools.  No recent change in bowel habits.  +vomiting GU:  No history of incontinence.   MUSCLOSKELETAL: No history of joint pain or swelling.  +myalgias.   SKIN: Negative for lesions, rash, and itching.   ENDOCRINE: Negative for cold or heat intolerance, polydipsia or goiter.   PSYCH:  No depression or anxiety symptoms.   NEURO: As Above.   Vital Signs:  BP 110/70   Pulse 80   Ht 5\' 3"  (1.6 m)   SpO2 93%   BMI 25.33 kg/m    General: Well appearing, comfortable.  Elongated face, temporal wasting CV: Regular rate and rhythm Ext: No edema  Neurological Exam: MENTAL STATUS including orientation to time, place, person, recent and remote memory, attention span and concentration, language, and fund of knowledge is normal.  Speech is not dysarthric.  CRANIAL NERVES:  Pupils equal round and reactive to light.  Normal conjugate, extra-ocular eye movements in all directions of gaze.  No ptosis.  Face is symmetric, elongated with transverse smile. Palate elevates symmetrically.  Tongue is midline.   MOTOR:  No atrophy, fasciculations or abnormal movements.  No pronator drift.  Tone is normal.    Right Upper Extremity:    Left Upper Extremity:    Deltoid  5/5   Deltoid  5/5   Biceps  5/5    Biceps  5/5   Triceps  5/5   Triceps  5/5   Wrist extensors  5/5   Wrist extensors  5/5   Wrist flexors  5/5   Wrist flexors  5/5   Finger extensors  4+/5   Finger extensors  4+/5   Finger flexors  4+/5   Finger flexors  4+/5   Dorsal interossei  4+/5   Dorsal interossei  4+/5   Abductor pollicis  4+/5   Abductor pollicis  4+/5   Tone (Ashworth scale)  0  Tone (Ashworth scale)  0   Right Lower Extremity:    Left Lower Extremity:    Hip flexors  5/5   Hip flexors  5/5   Hip extensors  5/5   Hip extensors  5/5   Knee flexors  5/5   Knee flexors  5/5   Knee extensors  5/5   Knee extensors  5/5   Dorsiflexors  5/5   Dorsiflexors  5/5   Plantarflexors  5/5   Plantarflexors  5/5   Toe extensors  4/5   Toe extensors  4/5   Toe flexors  4/5   Toe flexors  4/5   Tone (Ashworth scale)  0  Tone (Ashworth scale)  0   MSRs:  Reflexes are 2+/4 throughout, except absent Achilles bilaterally.  SENSORY:  Intact to vibration throughout.  COORDINATION/GAIT:  Mild dysmetria with finger-to- nose-finger testing on the right only.  Finger tapping intact.  Gait appears stable, unassisted.  She is unable to stand on heels or toes.  Data: Lab Results  Component Value Date   TSH 0.86 03/26/2016   Lab Results  Component Value Date   DIYMEBRA30 940 03/26/2016   Lab Results  Component Value Date   HGBA1C 5.8 (H) 01/01/2017     IMPRESSION/PLAN: Myotonic dystrophy type I, diagnosed 2009 by genetic testing.  She has typical exam findings of DM1 including elongated face, temporal wasting, mild facial weakness, myotonia and distal weakness.  She has appreciated benefit with physical therapy for leg strengthening and balance.  She would like to start occupational therapy for distal hand weakness, referral will be sent.  I encouraged her to comply with her home exercises.  Her most recent labs indicate no signs of diabetes. Annual EKG, referral to cardiology for ongoing evaluation for cardiac manifestations  of myotonic dystrophy.  She has seenDr. Burt Knack in the past. Recommend annual eye exam  Return to clinic in 9 months.   Thank you for allowing me to participate in patient's care.  If I can answer any additional questions, I would be pleased to do so.    Sincerely,    Allianna Beaubien K. Posey Pronto, DO

## 2017-04-07 DIAGNOSIS — M6281 Muscle weakness (generalized): Secondary | ICD-10-CM | POA: Diagnosis not present

## 2017-04-07 DIAGNOSIS — R262 Difficulty in walking, not elsewhere classified: Secondary | ICD-10-CM | POA: Diagnosis not present

## 2017-04-07 DIAGNOSIS — M545 Low back pain: Secondary | ICD-10-CM | POA: Diagnosis not present

## 2017-04-10 DIAGNOSIS — M545 Low back pain: Secondary | ICD-10-CM | POA: Diagnosis not present

## 2017-04-10 DIAGNOSIS — M6281 Muscle weakness (generalized): Secondary | ICD-10-CM | POA: Diagnosis not present

## 2017-04-10 DIAGNOSIS — R262 Difficulty in walking, not elsewhere classified: Secondary | ICD-10-CM | POA: Diagnosis not present

## 2017-04-14 DIAGNOSIS — M6281 Muscle weakness (generalized): Secondary | ICD-10-CM | POA: Diagnosis not present

## 2017-04-14 DIAGNOSIS — M545 Low back pain: Secondary | ICD-10-CM | POA: Diagnosis not present

## 2017-04-14 DIAGNOSIS — R262 Difficulty in walking, not elsewhere classified: Secondary | ICD-10-CM | POA: Diagnosis not present

## 2017-04-17 DIAGNOSIS — R262 Difficulty in walking, not elsewhere classified: Secondary | ICD-10-CM | POA: Diagnosis not present

## 2017-04-17 DIAGNOSIS — M6281 Muscle weakness (generalized): Secondary | ICD-10-CM | POA: Diagnosis not present

## 2017-04-17 DIAGNOSIS — M545 Low back pain: Secondary | ICD-10-CM | POA: Diagnosis not present

## 2017-04-21 DIAGNOSIS — M545 Low back pain: Secondary | ICD-10-CM | POA: Diagnosis not present

## 2017-04-21 DIAGNOSIS — M6281 Muscle weakness (generalized): Secondary | ICD-10-CM | POA: Diagnosis not present

## 2017-04-21 DIAGNOSIS — R262 Difficulty in walking, not elsewhere classified: Secondary | ICD-10-CM | POA: Diagnosis not present

## 2017-04-24 DIAGNOSIS — M545 Low back pain: Secondary | ICD-10-CM | POA: Diagnosis not present

## 2017-04-24 DIAGNOSIS — R262 Difficulty in walking, not elsewhere classified: Secondary | ICD-10-CM | POA: Diagnosis not present

## 2017-04-24 DIAGNOSIS — M6281 Muscle weakness (generalized): Secondary | ICD-10-CM | POA: Diagnosis not present

## 2017-04-28 DIAGNOSIS — M545 Low back pain: Secondary | ICD-10-CM | POA: Diagnosis not present

## 2017-04-28 DIAGNOSIS — M6281 Muscle weakness (generalized): Secondary | ICD-10-CM | POA: Diagnosis not present

## 2017-04-28 DIAGNOSIS — R262 Difficulty in walking, not elsewhere classified: Secondary | ICD-10-CM | POA: Diagnosis not present

## 2017-04-29 ENCOUNTER — Ambulatory Visit: Payer: Medicare HMO | Admitting: Family Medicine

## 2017-04-29 DIAGNOSIS — Z2089 Contact with and (suspected) exposure to other communicable diseases: Secondary | ICD-10-CM

## 2017-04-30 ENCOUNTER — Encounter: Payer: Self-pay | Admitting: Pulmonary Disease

## 2017-04-30 ENCOUNTER — Ambulatory Visit (INDEPENDENT_AMBULATORY_CARE_PROVIDER_SITE_OTHER): Payer: Medicare HMO | Admitting: Pulmonary Disease

## 2017-04-30 DIAGNOSIS — G4733 Obstructive sleep apnea (adult) (pediatric): Secondary | ICD-10-CM | POA: Insufficient documentation

## 2017-04-30 DIAGNOSIS — G4720 Circadian rhythm sleep disorder, unspecified type: Secondary | ICD-10-CM | POA: Diagnosis not present

## 2017-04-30 NOTE — Assessment & Plan Note (Signed)
Although she may have obstructive sleep apnea, she is not willing to go through a sleep study and I think this would be a futile exercise until we get her sleep pattern in order. She is also claustrophobic and at this point is not willing to engage in a discussion about CPAP therapy

## 2017-04-30 NOTE — Progress Notes (Signed)
Subjective:    Patient ID: Valerie Friedman, female    DOB: 1975-06-20, 42 y.o.   MRN: [id number]  HPI  Chief Complaint  Patient presents with  . Sleep Consult    Referred by Dr. Posey Pronto at Neurology for sleep consult. Patient has already had a SS but no CPAP due being clastrophobic.    42 year old woman with myotonic dystrophy is referred by neurology for evaluation of sleep disordered breathing.  She is accompanied by her mother who also has a mild form of the disease.  Her brother also has myotonic dystrophy. Patient herself states that she is not sure why she was referred.  She states that she had a sleep study done in 2009 where she only slept for less than 45 minutes and was told that she had sleep apnea but she cannot understand how that can be with such little sleep.  At any rate she states that she is claustrophobic and would not tolerate CPAP therapy.  She reports increased sleep latency which she attributes to the TV staying on and her brother in the next room making noises disturbing her and her daughter with at home she cannot sleep.  She states that she absolutely cannot undergo another sleep study in the sleep center.  She seems to not trust her doctors.  She wants her CPK level tested and is frustrated that none of her doctors have done that.  She does not seem to have a fixed sleep time or wake up time.  There are times that she stays up all throughout the night other times she falls asleep around 5 AM.  She has gone for 5 days and nights at one time without sleeping and then discussed and slept for 18 hours. Bedtime is around 11 PM but she stays in bed and watches TV in can actually go to sleep as late as 5 AM, sleep latency several hours, she sleeps on her side with one pillow, when she does form of sleep, she denies awakenings or nocturia, wake up time is not fixed and can be as early as 10 AM but as late as 2-3PM. There is no history suggestive of cataplexy, sleep paralysis or  parasomnias  She quit smoking 2 years ago and drinks alcohol socially.  Her boyfriend has a 104-month son that she is attached to and would like to take care of him this seems to motivate her to get her sleep cycle back in order In general, this was a very difficult history taking exercise and I had to spend considerable time gaining this patient's trust to be able to identify her sleep problem  Significant tests/ events reviewed  NPSG 07/2007 total sleep time 122 minutes, 53 hypopneas with AHI 26/hour    Past Medical History:  Diagnosis Date  . Anxiety   . Chronic lower back pain DUE TO MYOTONIC DYSTROPHY  . Migraine   . Myotonic dystrophy ([place]) [place] 2009  ----- TYPE 1   FOLLOWED BY DR. Erling Cruz  . Numbness of fingers BILATERAL HANDS/  OCCASIONLLY RADIATES UP ARMS  . Pelvic pain in female   . Vitamin D deficiency    Past Surgical History:  Procedure Laterality Date  . BENIGN BREAST BX   FEB  2011  . BREAST BIOPSY Right 2011   Benign Stereo   . LAPAROSCOPY  05/01/2011   Procedure: LAPAROSCOPY DIAGNOSTIC;  Surgeon: Bennetta Laos, MD;  Location[facility];  Service: Gynecology;  Laterality: N/A;  with  bilateral chromopertubation, aspiration of functional left ovarian cyst, excision of two hydatid cysts  . MUSCLE BIOPSY  02-25-2007   LEFT THIGH QUADRICEP BX X3  FOR MYOSITIS  . SURG. FOR REMOVAL OF TEETH FRAGMENTS    MARCH 2012   [facility]    Allergies  Allergen Reactions  . Latex Hives  . Aleve [Naproxen Sodium] Other (See Comments)    "GETS THE CHILLS"  . Ciprocin-Fluocin-Procin [Fluocinolone Acetonide] Other (See Comments)    GI UPSET AND HEADACHE  . Macrobid [Nitrofurantoin Monohydrate Macrocrystals] Other (See Comments)    GI UPSET; headache  . Yellow Dyes (Non-Tartrazine) Nausea And Vomiting    YELLOW DYE #5    . Other Rash    [place] JELLY      Social History   Socioeconomic History  . Marital status: Single    Spouse name: Not on file   . Number of children: Not on file  . Years of education: Not on file  . Highest education level: Not on file  Occupational History  . Not on file  Social Needs  . Financial resource strain: Not on file  . Food insecurity:    Worry: Not on file    Inability: Not on file  . Transportation needs:    Medical: Not on file    Non-medical: Not on file  Tobacco Use  . Smoking status: Former Smoker    Packs/day: 0.25    Years: 10.00    Pack years: 2.50    Types: Cigarettes    Last attempt to quit: 06/28/2016    Years since quitting: 0.8  . Smokeless tobacco: Never Used  . Tobacco comment: varies, 0-0.5 pack per day  Substance and Sexual Activity  . Alcohol use: Yes    Alcohol/week: 0.0 oz    Comment: rare  . Drug use: No  . Sexual activity: Yes    Birth control/protection: None  Lifestyle  . Physical activity:    Days per week: Not on file    Minutes per session: Not on file  . Stress: Not on file  Relationships  . Social connections:    Talks on phone: Not on file    Gets together: Not on file    Attends religious service: Not on file    Active member of club or organization: Not on file    Attends meetings of clubs or organizations: Not on file    Relationship status: Not on file  . Intimate partner violence:    Fear of current or ex partner: Not on file    Emotionally abused: Not on file    Physically abused: Not on file    Forced sexual activity: Not on file  Other Topics Concern  . Not on file  Social History Narrative   Pt lives in 1 story home with her parents and brother   Some college   On disability      Family History  Problem Relation Age of Onset  . Diabetes Maternal Grandfather   . Heart disease Maternal Grandfather   . Diabetes Mother   . Hypertension Father   . Hyperlipidemia Father   . Heart disease Father   . Cancer Paternal Uncle        Colon cancer  . Parkinsonism Paternal Uncle   . Heart disease Maternal Grandmother   . Heart disease  Paternal Grandmother   . Stroke Paternal Grandmother   . CAD Paternal Grandmother   . Heart disease Paternal Grandfather   .  Diabetes Brother   . Atrial fibrillation Brother      Review of Systems Positive for loss of appetite, abdominal pain, sore throat, headaches, nasal congestion, sneezing, anxiety and depression, joint stiffness  Constitutional: negative for anorexia, fevers and sweats  Eyes: negative for irritation, redness and visual disturbance  Ears, nose, mouth, throat, and face: negative for earaches, epistaxis, nasal congestion and sore throat  Respiratory: negative for cough, dyspnea on exertion, sputum and wheezing  Cardiovascular: negative for chest pain, dyspnea, lower extremity edema, orthopnea, palpitations and syncope  Gastrointestinal: negative for abdominal pain, constipation, diarrhea, melena, nausea and vomiting  Genitourinary:negative for dysuria, frequency and hematuria  Hematologic/lymphatic: negative for bleeding, easy bruising and lymphadenopathy  Musculoskeletal:negative for arthralgias, Neurological: negative for coordination problems, gait problems, headaches and weakness  Endocrine: negative for diabetic symptoms including polydipsia, polyuria and weight loss     Objective:   Physical Exam  Gen. Flat affect, well-nourished, in no distress ENT - no thrush, no post nasal drip Neck: No JVD, no thyromegaly, no carotid bruits Lungs: no use of accessory muscles, no dullness to percussion, clear without rales or rhonchi  Cardiovascular: Rhythm regular, heart sounds  normal, no murmurs or gallops, no peripheral edema Musculoskeletal: No deformities, no cyanosis or clubbing  Neuro - power 4/5, some wasting of hands        Assessment & Plan:

## 2017-04-30 NOTE — Assessment & Plan Note (Signed)
In general this was a very difficult history taking exercise due to lack of trust in this patient has for her doctors.  I was at least able to pinpoint that she at the very least does have circadian rhythm disorder and very poor sleep hygiene-a lot of which may be related to medical issues and concomitant behavioral disorder  Keep sleep diary for 2 weeks/  Rules of sleep hygiene were discussed  - light exercise -avoid caffeinated beverages - no more than 20 mins staying awake in bed, if not asleep, get out of bed & reading or light music - No TV or computer games at bedtime. -light exposure 30 mins daily around 10-11 am  Referral to behaviour sleep therapist

## 2017-04-30 NOTE — Patient Instructions (Signed)
We discussed that you may have a problem with circadian rhythm disorder body clock disorder.  Keep sleep diary for 2 weeks/  Rules of sleep hygiene were discussed  - light exercise -avoid caffeinated beverages - no more than 20 mins staying awake in bed, if not asleep, get out of bed & reading or light music - No TV or computer games at bedtime. -light exposure 30 mins daily around 10-11 am  Referral to behaviour sleep therapist

## 2017-05-01 DIAGNOSIS — M6281 Muscle weakness (generalized): Secondary | ICD-10-CM | POA: Diagnosis not present

## 2017-05-01 DIAGNOSIS — R262 Difficulty in walking, not elsewhere classified: Secondary | ICD-10-CM | POA: Diagnosis not present

## 2017-05-01 DIAGNOSIS — M545 Low back pain: Secondary | ICD-10-CM | POA: Diagnosis not present

## 2017-05-08 DIAGNOSIS — M6281 Muscle weakness (generalized): Secondary | ICD-10-CM | POA: Diagnosis not present

## 2017-05-08 DIAGNOSIS — M545 Low back pain: Secondary | ICD-10-CM | POA: Diagnosis not present

## 2017-05-08 DIAGNOSIS — R262 Difficulty in walking, not elsewhere classified: Secondary | ICD-10-CM | POA: Diagnosis not present

## 2017-05-12 DIAGNOSIS — M6281 Muscle weakness (generalized): Secondary | ICD-10-CM | POA: Diagnosis not present

## 2017-05-12 DIAGNOSIS — R262 Difficulty in walking, not elsewhere classified: Secondary | ICD-10-CM | POA: Diagnosis not present

## 2017-05-12 DIAGNOSIS — M545 Low back pain: Secondary | ICD-10-CM | POA: Diagnosis not present

## 2017-05-15 DIAGNOSIS — M545 Low back pain: Secondary | ICD-10-CM | POA: Diagnosis not present

## 2017-05-15 DIAGNOSIS — M6281 Muscle weakness (generalized): Secondary | ICD-10-CM | POA: Diagnosis not present

## 2017-05-15 DIAGNOSIS — R262 Difficulty in walking, not elsewhere classified: Secondary | ICD-10-CM | POA: Diagnosis not present

## 2017-05-19 DIAGNOSIS — R262 Difficulty in walking, not elsewhere classified: Secondary | ICD-10-CM | POA: Diagnosis not present

## 2017-05-19 DIAGNOSIS — M6281 Muscle weakness (generalized): Secondary | ICD-10-CM | POA: Diagnosis not present

## 2017-05-19 DIAGNOSIS — M545 Low back pain: Secondary | ICD-10-CM | POA: Diagnosis not present

## 2017-05-22 DIAGNOSIS — M6281 Muscle weakness (generalized): Secondary | ICD-10-CM | POA: Diagnosis not present

## 2017-05-22 DIAGNOSIS — R262 Difficulty in walking, not elsewhere classified: Secondary | ICD-10-CM | POA: Diagnosis not present

## 2017-05-22 DIAGNOSIS — M545 Low back pain: Secondary | ICD-10-CM | POA: Diagnosis not present

## 2017-05-26 DIAGNOSIS — R262 Difficulty in walking, not elsewhere classified: Secondary | ICD-10-CM | POA: Diagnosis not present

## 2017-05-26 DIAGNOSIS — M6281 Muscle weakness (generalized): Secondary | ICD-10-CM | POA: Diagnosis not present

## 2017-05-26 DIAGNOSIS — M545 Low back pain: Secondary | ICD-10-CM | POA: Diagnosis not present

## 2017-05-29 DIAGNOSIS — R262 Difficulty in walking, not elsewhere classified: Secondary | ICD-10-CM | POA: Diagnosis not present

## 2017-05-29 DIAGNOSIS — M6281 Muscle weakness (generalized): Secondary | ICD-10-CM | POA: Diagnosis not present

## 2017-05-29 DIAGNOSIS — M545 Low back pain: Secondary | ICD-10-CM | POA: Diagnosis not present

## 2017-06-02 DIAGNOSIS — M545 Low back pain: Secondary | ICD-10-CM | POA: Diagnosis not present

## 2017-06-02 DIAGNOSIS — R262 Difficulty in walking, not elsewhere classified: Secondary | ICD-10-CM | POA: Diagnosis not present

## 2017-06-02 DIAGNOSIS — M6281 Muscle weakness (generalized): Secondary | ICD-10-CM | POA: Diagnosis not present

## 2017-06-09 DIAGNOSIS — R262 Difficulty in walking, not elsewhere classified: Secondary | ICD-10-CM | POA: Diagnosis not present

## 2017-06-09 DIAGNOSIS — M545 Low back pain: Secondary | ICD-10-CM | POA: Diagnosis not present

## 2017-06-09 DIAGNOSIS — M6281 Muscle weakness (generalized): Secondary | ICD-10-CM | POA: Diagnosis not present

## 2017-06-15 ENCOUNTER — Encounter: Payer: Self-pay | Admitting: Family Medicine

## 2017-06-15 ENCOUNTER — Ambulatory Visit (INDEPENDENT_AMBULATORY_CARE_PROVIDER_SITE_OTHER): Payer: Medicare HMO | Admitting: Family Medicine

## 2017-06-15 VITALS — BP 128/72 | HR 85 | Temp 97.8°F | Ht 63.0 in | Wt 155.3 lb

## 2017-06-15 DIAGNOSIS — K219 Gastro-esophageal reflux disease without esophagitis: Secondary | ICD-10-CM | POA: Diagnosis not present

## 2017-06-15 DIAGNOSIS — R739 Hyperglycemia, unspecified: Secondary | ICD-10-CM | POA: Diagnosis not present

## 2017-06-15 DIAGNOSIS — R233 Spontaneous ecchymoses: Secondary | ICD-10-CM

## 2017-06-15 DIAGNOSIS — M797 Fibromyalgia: Secondary | ICD-10-CM

## 2017-06-15 LAB — CBC WITH DIFFERENTIAL/PLATELET
BASOS PCT: 0.5 % (ref 0.0–3.0)
Basophils Absolute: 0 10*3/uL (ref 0.0–0.1)
EOS ABS: 0.1 10*3/uL (ref 0.0–0.7)
EOS PCT: 0.8 % (ref 0.0–5.0)
HCT: 39.3 % (ref 36.0–46.0)
Hemoglobin: 12.8 g/dL (ref 12.0–15.0)
Lymphocytes Relative: 21.4 % (ref 12.0–46.0)
Lymphs Abs: 1.7 10*3/uL (ref 0.7–4.0)
MCHC: 32.6 g/dL (ref 30.0–36.0)
MCV: 91 fl (ref 78.0–100.0)
MONO ABS: 0.5 10*3/uL (ref 0.1–1.0)
MONOS PCT: 6.1 % (ref 3.0–12.0)
NEUTROS PCT: 71.2 % (ref 43.0–77.0)
Neutro Abs: 5.5 10*3/uL (ref 1.4–7.7)
PLATELETS: 231 10*3/uL (ref 150.0–400.0)
RBC: 4.32 Mil/uL (ref 3.87–5.11)
RDW: 14.7 % (ref 11.5–15.5)
WBC: 7.8 10*3/uL (ref 4.0–10.5)

## 2017-06-15 LAB — BASIC METABOLIC PANEL
BUN: 12 mg/dL (ref 6–23)
CALCIUM: 9.5 mg/dL (ref 8.4–10.5)
CHLORIDE: 106 meq/L (ref 96–112)
CO2: 31 mEq/L (ref 19–32)
CREATININE: 0.68 mg/dL (ref 0.40–1.20)
GFR: 100.65 mL/min (ref >60.00)
Glucose, Bld: 100 mg/dL — ABNORMAL HIGH (ref 70–99)
Potassium: 4.6 mEq/L (ref 3.5–5.1)
Sodium: 143 mEq/L (ref 135–145)

## 2017-06-15 LAB — PROTIME-INR
INR: 0.9 ratio (ref 0.8–1.0)
Prothrombin Time: 10.8 s (ref 9.6–13.1)

## 2017-06-15 LAB — HEPATIC FUNCTION PANEL
ALT: 47 U/L — ABNORMAL HIGH (ref 0–35)
AST: 37 U/L (ref 0–37)
Albumin: 4.1 g/dL (ref 3.5–5.2)
Alkaline Phosphatase: 58 U/L (ref 39–117)
BILIRUBIN DIRECT: 0.1 mg/dL (ref 0.0–0.3)
BILIRUBIN TOTAL: 0.5 mg/dL (ref 0.2–1.2)
Total Protein: 6.6 g/dL (ref 6.0–8.3)

## 2017-06-15 LAB — TSH: TSH: 1.54 u[IU]/mL (ref 0.35–4.50)

## 2017-06-15 LAB — APTT: aPTT: 29 s (ref 23.4–32.7)

## 2017-06-15 LAB — CK: Total CK: 129 U/L (ref 7–177)

## 2017-06-15 MED ORDER — ESOMEPRAZOLE MAGNESIUM 40 MG PO CPDR: 40.0000 mg | DELAYED_RELEASE_CAPSULE | Freq: Every day | ORAL | 5 refills | Status: DC

## 2017-06-15 NOTE — Progress Notes (Signed)
   Subjective:    Patient ID: Valerie Friedman, female    DOB: March 12, 1975, 42 y.o.   MRN: [id number]  HPI Here to check on apparent bruises that appeared on her back about 3 weeks ago. They are painful to her and sometimes it hurts to lie on her back. No recent trauma or falls. Also her GERD has been acting up and it does not respond to Pepcid OTC. No vomiting but she feels hot liquid come up almost to the back of the throat at times. She sleeps with the head of the bed elevated.    Review of Systems  Constitutional: Negative.   Respiratory: Negative.   Cardiovascular: Negative.   Gastrointestinal: Negative.   Musculoskeletal: Positive for myalgias.  Hematological: Bruises/bleeds easily.       Objective:   Physical Exam  Constitutional: She appears well-developed and well-nourished.  Cardiovascular: Normal rate, regular rhythm, normal heart sounds and intact distal pulses.  Pulmonary/Chest: Effort normal and breath sounds normal.  Abdominal: Soft. Bowel sounds are normal. She exhibits no distension and no mass. There is no tenderness. There is no rebound and no guarding. No hernia.  Musculoskeletal:  She is mildly tender over the entire back   Skin:  There are several areas of light tan or light yellow ecchymosis on the center of the back and on both sides of the lower back          Assessment & Plan:  She has bruising that we cannot account for. She will get labs today to check for platelets, liver function, etc. For the GERD she will try Nexium 40 mg daily.  Alysia Penna, MD

## 2017-06-15 NOTE — Addendum Note (Signed)
Addended by: Alysia Penna A on: 06/15/2017 03:57 PM   Modules accepted: Orders

## 2017-06-16 DIAGNOSIS — M545 Low back pain: Secondary | ICD-10-CM | POA: Diagnosis not present

## 2017-06-16 DIAGNOSIS — R262 Difficulty in walking, not elsewhere classified: Secondary | ICD-10-CM | POA: Diagnosis not present

## 2017-06-16 DIAGNOSIS — M6281 Muscle weakness (generalized): Secondary | ICD-10-CM | POA: Diagnosis not present

## 2017-06-18 DIAGNOSIS — Z0100 Encounter for examination of eyes and vision without abnormal findings: Secondary | ICD-10-CM | POA: Diagnosis not present

## 2017-06-18 DIAGNOSIS — H524 Presbyopia: Secondary | ICD-10-CM | POA: Diagnosis not present

## 2017-06-18 DIAGNOSIS — H5213 Myopia, bilateral: Secondary | ICD-10-CM | POA: Diagnosis not present

## 2017-06-18 NOTE — Addendum Note (Signed)
Addended by: Alysia Penna A on: 06/18/2017 12:51 PM   Modules accepted: Orders

## 2017-06-19 DIAGNOSIS — M6281 Muscle weakness (generalized): Secondary | ICD-10-CM | POA: Diagnosis not present

## 2017-06-19 DIAGNOSIS — M545 Low back pain: Secondary | ICD-10-CM | POA: Diagnosis not present

## 2017-06-19 DIAGNOSIS — R262 Difficulty in walking, not elsewhere classified: Secondary | ICD-10-CM | POA: Diagnosis not present

## 2017-06-22 ENCOUNTER — Other Ambulatory Visit: Payer: Self-pay | Admitting: Family Medicine

## 2017-06-22 ENCOUNTER — Other Ambulatory Visit: Payer: Self-pay

## 2017-06-22 ENCOUNTER — Other Ambulatory Visit (INDEPENDENT_AMBULATORY_CARE_PROVIDER_SITE_OTHER): Payer: Medicare HMO

## 2017-06-22 DIAGNOSIS — R233 Spontaneous ecchymoses: Secondary | ICD-10-CM

## 2017-06-22 LAB — SEDIMENTATION RATE: Sed Rate: 40 mm/hr — ABNORMAL HIGH (ref 0–20)

## 2017-06-22 LAB — MAGNESIUM: Magnesium: 2.3 mg/dL (ref 1.5–2.5)

## 2017-06-22 LAB — C-REACTIVE PROTEIN: CRP: 0.5 mg/dL (ref 0.5–20.0)

## 2017-06-22 NOTE — Telephone Encounter (Signed)
Stop the Tylenol. Call in Tramadol 50 mg to take 2 tabs every 6 hours prn pain, #60 with 2 rf

## 2017-06-22 NOTE — Telephone Encounter (Signed)
Stop the Tramadol. Call in Diclofenac 75 mg to take bid prn pain, #60 with 2 rf

## 2017-06-22 NOTE — Telephone Encounter (Signed)
Patient is concerned about taking so much Tylenol daily due to damaging her liver being her liver enzymes being elevated.  She also needs to know if he is calling something in for pain.  She is requesting a call back about her concerns.

## 2017-06-22 NOTE — Telephone Encounter (Signed)
Pt stated that she does NOT care to take tramadol. Pt stated that she was addicted to this medication for 8 years and does NOT care to ever get back on the medication. Pt would like this documented in her chart as well. Listed on pt's allergy list with reason: pt had a dependency Hx with this medication.  Sent to PCP to figure out what other non anti inflammatory medication to prescribe the pt.

## 2017-06-22 NOTE — Telephone Encounter (Signed)
Sent to PCP to advise 

## 2017-06-23 DIAGNOSIS — M6281 Muscle weakness (generalized): Secondary | ICD-10-CM | POA: Diagnosis not present

## 2017-06-23 DIAGNOSIS — R262 Difficulty in walking, not elsewhere classified: Secondary | ICD-10-CM | POA: Diagnosis not present

## 2017-06-23 DIAGNOSIS — M545 Low back pain: Secondary | ICD-10-CM | POA: Diagnosis not present

## 2017-06-23 MED ORDER — DICLOFENAC SODIUM 75 MG PO TBEC: 75.0000 mg | DELAYED_RELEASE_TABLET | Freq: Two times a day (BID) | ORAL | 2 refills | Status: DC | PRN

## 2017-06-23 NOTE — Telephone Encounter (Signed)
Sent to PCP pt wanted [place] to make sure this medication doesn't contradict with current allergy list sent to PCP to advise   Thanks  If Ok to send in sent to [facility] [address]

## 2017-06-23 NOTE — Telephone Encounter (Signed)
Please call this in. This is not related to Naproxen.

## 2017-06-24 LAB — ANTI-DNA ANTIBODY, DOUBLE-STRANDED: ds DNA Ab: <1 [IU]/mL

## 2017-06-24 LAB — ANA: Anti Nuclear Antibody(ANA): NEGATIVE

## 2017-06-26 DIAGNOSIS — M6281 Muscle weakness (generalized): Secondary | ICD-10-CM | POA: Diagnosis not present

## 2017-06-26 DIAGNOSIS — R262 Difficulty in walking, not elsewhere classified: Secondary | ICD-10-CM | POA: Diagnosis not present

## 2017-06-26 DIAGNOSIS — M545 Low back pain: Secondary | ICD-10-CM | POA: Diagnosis not present

## 2017-06-29 ENCOUNTER — Encounter: Payer: Self-pay | Admitting: Family Medicine

## 2017-06-29 ENCOUNTER — Ambulatory Visit (INDEPENDENT_AMBULATORY_CARE_PROVIDER_SITE_OTHER): Payer: Medicare HMO | Admitting: Family Medicine

## 2017-06-29 VITALS — BP 102/70 | HR 82 | Temp 98.0°F | Ht 63.0 in | Wt 156.8 lb

## 2017-06-29 DIAGNOSIS — M545 Low back pain, unspecified: Secondary | ICD-10-CM

## 2017-06-29 DIAGNOSIS — G8929 Other chronic pain: Secondary | ICD-10-CM

## 2017-06-29 DIAGNOSIS — L819 Disorder of pigmentation, unspecified: Secondary | ICD-10-CM | POA: Diagnosis not present

## 2017-06-29 DIAGNOSIS — M546 Pain in thoracic spine: Secondary | ICD-10-CM | POA: Diagnosis not present

## 2017-06-29 DIAGNOSIS — M797 Fibromyalgia: Secondary | ICD-10-CM | POA: Diagnosis not present

## 2017-06-29 NOTE — Progress Notes (Signed)
   Subjective:    Patient ID: Valerie Friedman, female    DOB: 12-11-1975, 42 y.o.   MRN: [id number]  HPI Here to recheck the discoloration on her back and to discuss other issues. She has had brown sopts on her skin for about 8 weeks now and the markings have not changed at all. At our last visit we thought they might have been the result of bruising, but this does not seem likely at this point. We did al lot of testing which was all normal. She had tried Diclofenac for her fibromyalgia pain, but she stopped this because it made her too sleepy. She now wonders if Tylenol would be a better choice. She is getting PT and this may be helping her a bit. In addition she has intermittent stiffness and severe pains in the central and lower spine. No radiation of pain to the legs. No numbness or weakness in the legs. She was told by a Sports Medicine doctor a few years ago that they suspected she has some herniated discs in the spine.  Review of Systems  Constitutional: Negative.   Respiratory: Negative.   Cardiovascular: Negative.   Musculoskeletal: Positive for back pain and myalgias.  Skin: Positive for color change.  Neurological: Negative.        Objective:   Physical Exam  Constitutional: She is oriented to person, place, and time. She appears well-developed and well-nourished.  Cardiovascular: Normal rate, regular rhythm, normal heart sounds and intact distal pulses.  Pulmonary/Chest: Effort normal and breath sounds normal.  Musculoskeletal:  She is tender along the thoracic and lumbar spines with some reduced ROM. Not much spasm is noted   Neurological: She is alert and oriented to person, place, and time.  Skin:  The areas of brown coloration on the central lower back and on both lateral sides of the lower back has not changed since our last exam           Assessment & Plan:  We will refer her to Dermatology for the hyperpigmentation on the back. To evaluate the spinal pain, we will  set her up for MRI scans of the thoracic and lumbar spines. For myalgia pains she can take Tylenol 1000 mg bid prn.  Alysia Penna, MD

## 2017-06-30 DIAGNOSIS — R262 Difficulty in walking, not elsewhere classified: Secondary | ICD-10-CM | POA: Diagnosis not present

## 2017-06-30 DIAGNOSIS — M6281 Muscle weakness (generalized): Secondary | ICD-10-CM | POA: Diagnosis not present

## 2017-06-30 DIAGNOSIS — M545 Low back pain: Secondary | ICD-10-CM | POA: Diagnosis not present

## 2017-07-06 ENCOUNTER — Telehealth: Payer: Self-pay | Admitting: *Deleted

## 2017-07-06 NOTE — Telephone Encounter (Signed)
Sent to PCP to advise 

## 2017-07-06 NOTE — Telephone Encounter (Signed)
I set her up for MRI scans of the thoracic spine and lumbar spine.  She does not need a lumbar puncture. I have no idea why [facility] was involved. Please speak to her and get more info

## 2017-07-06 NOTE — Telephone Encounter (Signed)
Copied from [place] [id number]. Topic: Referral - Status >> Jul 06, 2017  9:52 AM Scherrie Gerlach wrote: Reason for CRM: pt following up on referral for MRI lumbar puncture. Pt states there was supposed to be 2 referral for a MRI. One of the referral says faxed to [facility].  Pt knows nothing about going to a hearing MD. Pt is requesting a call back today.

## 2017-07-06 NOTE — Telephone Encounter (Signed)
Called and spoke with pt. Pt has been set up for BOTH MRI's of the spine and lumbar. Pt stated that the referral to see dermatology did NOT accept her insurance. Pt stated that her insurance was accept at Dr. Juel Burrow office @ [facility].   Neoma Laming is aware of this tried to call Dr. Juel Burrow office today to set up referral but the office is close will have to call tomorrow.   Pt is aware that we will work on this tomorrow.   As far as the [facility] hearing or hearing MD pt stated that when she spoke with Juliann Pulse at the call center she stated that was a doctor's name that she would be seeing?  I told pt that I was sorry but I think that was a miscommunication.

## 2017-07-06 NOTE — Telephone Encounter (Signed)
Sent to [employer] as a reminder to call to set up referral to see [employer], [place]

## 2017-07-06 NOTE — Telephone Encounter (Signed)
Sent to [facility] for assistance

## 2017-07-06 NOTE — Telephone Encounter (Signed)
Copied from [place] [id number]. Topic: Referral - Request >> Jun 30, 2017 10:20 AM Mylinda Latina, NT wrote: Reason for CRM: Patient called and states the referral coordinator needed a name of a Dermatologist that her insurance will cover. The Dr name is Dr. Allyn Kenner. Office # is [phone]. Please advise   >> Jul 06, 2017  9:47 AM Scherrie Gerlach wrote: Pt following up on this referral. Due to Dr Allyson Sabal not taking her insurance This referral has not been done. Please advise

## 2017-07-07 DIAGNOSIS — R262 Difficulty in walking, not elsewhere classified: Secondary | ICD-10-CM | POA: Diagnosis not present

## 2017-07-07 DIAGNOSIS — M545 Low back pain: Secondary | ICD-10-CM | POA: Diagnosis not present

## 2017-07-07 DIAGNOSIS — M6281 Muscle weakness (generalized): Secondary | ICD-10-CM | POA: Diagnosis not present

## 2017-07-07 NOTE — Telephone Encounter (Signed)
Pt calling again wanting to talk to someone regarding her MRI coverage and insurance.  I advised that this message has been sent to [facility] to assist and that she will be here this afternoon and can call her back. Pt states that she will not be available from 1:30-3:30 but that if we call we have permission to speak with her Mother Valerie Friedman (one time HIPPA) about this. I advised that normally we do not do verbal HIPPA Consent but this one time if we need to speak with her mother we can allow it since its limited information. Advised that she needs to update her HIPPA because we do not have one on file for our office giving consent for information to be disclosed to her mother.

## 2017-07-08 NOTE — Telephone Encounter (Signed)
Valerie Friedman has already spoken with pt re: MRI's. These have been sent to [place] to be scheduled.

## 2017-07-10 DIAGNOSIS — R262 Difficulty in walking, not elsewhere classified: Secondary | ICD-10-CM | POA: Diagnosis not present

## 2017-07-10 DIAGNOSIS — M6281 Muscle weakness (generalized): Secondary | ICD-10-CM | POA: Diagnosis not present

## 2017-07-10 DIAGNOSIS — M545 Low back pain: Secondary | ICD-10-CM | POA: Diagnosis not present

## 2017-07-14 ENCOUNTER — Ambulatory Visit
Admission: RE | Admit: 2017-07-14 | Discharge: 2017-07-14 | Disposition: A | Discharge status: Home / Self Care | Payer: Medicare HMO | Source: Ambulatory Visit | Attending: Family Medicine | Admitting: Family Medicine

## 2017-07-14 ENCOUNTER — Other Ambulatory Visit: Payer: Medicare HMO

## 2017-07-14 DIAGNOSIS — M546 Pain in thoracic spine: Secondary | ICD-10-CM | POA: Diagnosis not present

## 2017-07-14 DIAGNOSIS — M545 Low back pain, unspecified: Secondary | ICD-10-CM

## 2017-07-14 DIAGNOSIS — G8929 Other chronic pain: Secondary | ICD-10-CM

## 2017-07-15 DIAGNOSIS — M6281 Muscle weakness (generalized): Secondary | ICD-10-CM | POA: Diagnosis not present

## 2017-07-15 DIAGNOSIS — R262 Difficulty in walking, not elsewhere classified: Secondary | ICD-10-CM | POA: Diagnosis not present

## 2017-07-15 DIAGNOSIS — M545 Low back pain: Secondary | ICD-10-CM | POA: Diagnosis not present

## 2017-07-17 DIAGNOSIS — M545 Low back pain: Secondary | ICD-10-CM | POA: Diagnosis not present

## 2017-07-17 DIAGNOSIS — R262 Difficulty in walking, not elsewhere classified: Secondary | ICD-10-CM | POA: Diagnosis not present

## 2017-07-17 DIAGNOSIS — M6281 Muscle weakness (generalized): Secondary | ICD-10-CM | POA: Diagnosis not present

## 2017-07-27 DIAGNOSIS — L538 Other specified erythematous conditions: Secondary | ICD-10-CM | POA: Diagnosis not present

## 2017-07-28 ENCOUNTER — Ambulatory Visit: Payer: Medicare HMO | Admitting: Family Medicine

## 2017-07-28 DIAGNOSIS — Z0289 Encounter for other administrative examinations: Secondary | ICD-10-CM

## 2017-10-26 ENCOUNTER — Ambulatory Visit: Payer: Self-pay | Admitting: *Deleted

## 2017-10-26 NOTE — Telephone Encounter (Signed)
Pt called in c/o having dizzy spells that started Friday morning.   I wonder if I have vertigo because my mother has it and my symptoms are similar to her episodes.   I'm having dizziness about every 2 hours.  See triage notes.  I have scheduled her with Dr. Sarajane Jews for Wed. 10/28/17 at 11:15.   She's not driving due to the dizzy spells.   She could not come in Tuesday because of other doctor appts so she asked to be seen on Wednesday.  The protocol is to be seen within 24 hours however she is not able to get to the office until Wednesday.  I instructed her to go to the ED if her symptoms become worse, she falls or faints.    She verbalized understanding. Reason for Disposition . [1] MODERATE dizziness (e.g., interferes with normal activities) AND [2] has NOT been evaluated by physician for this  (Exception: dizziness caused by heat exposure, sudden standing, or poor fluid intake)  Answer Assessment - Initial Assessment Questions 1. DESCRIPTION: "Describe your dizziness."     It started early Friday morning 9:30am.   I had breakfast.  Lack of food makes me dizzy but I had eaten.    An hour later the dizziness started.   My mother has vertigo.   It sounds like vertigo but I'm not sure.   I've fallen twice because of the dizziness.   It happens every 2 hours.   It happens when I'm sitting watching TV or sitting down.   My Bp was normal.   My pulse 75.  I checked all this after I had a dizzy spell. 2. LIGHTHEADED: "Do you feel lightheaded?" (e.g., somewhat faint, woozy, weak upon standing)     Yes.    Not feeling it at this moment.   When I rolled over last night "the room shifted".   It was 11:00 last night.   It happens more when I'm laying down than sitting up. 3. VERTIGO: "Do you feel like either you or the room is spinning or tilting?" (i.e. vertigo)     The room shifts when I'm laying down.  4. SEVERITY: "How bad is it?"  "Do you feel like you are going to faint?" "Can you stand and walk?"   - MILD  - walking normally   - MODERATE - interferes with normal activities (e.g., work, school)    - SEVERE - unable to stand, requires support to walk, feels like passing out now.      I have fallen twice.   My knees are bruised from falling.    I'm not alone.   I have Myotonic muscular dystrophy.   The last 2 times I fell I was dizzy.    I've not hit my head. 5. ONSET:  "When did the dizziness begin?"     Friday morning. 6. AGGRAVATING FACTORS: "Does anything make it worse?" (e.g., standing, change in head position)     Rolling in bed.   Dizziness about every 2 hours.   I'm not driving right now.   My brother has driven me where I need to go. 7. HEART RATE: "Can you tell me your heart rate?" "How many beats in 15 seconds?"  (Note: not all patients can do this)       See above 8. CAUSE: "What do you think is causing the dizziness?"     Maybe vertigo.   It sounds like when my mother has dizzy spells. 9. RECURRENT SYMPTOM: "  Have you had dizziness before?" If so, ask: "When was the last time?" "What happened that time?"     No. 10. OTHER SYMPTOMS: "Do you have any other symptoms?" (e.g., fever, chest pain, vomiting, diarrhea, bleeding)       Last night I felt nausea when I was dizzy.   I didn't even eat dinner because I felt so bad.  Today I'm fine.     11. PREGNANCY: "Is there any chance you are pregnant?" "When was your last menstrual period?"       No, most likely not.  Protocols used: DIZZINESS [facility]

## 2017-10-28 ENCOUNTER — Encounter: Payer: Self-pay | Admitting: Family Medicine

## 2017-10-28 ENCOUNTER — Ambulatory Visit (INDEPENDENT_AMBULATORY_CARE_PROVIDER_SITE_OTHER): Payer: Medicare HMO | Admitting: Family Medicine

## 2017-10-28 VITALS — BP 112/62 | HR 72 | Temp 98.5°F | Wt 160.0 lb

## 2017-10-28 DIAGNOSIS — R42 Dizziness and giddiness: Secondary | ICD-10-CM

## 2017-10-28 MED ORDER — MECLIZINE HCL 25 MG PO TABS: 25.0000 mg | ORAL_TABLET | ORAL | 2 refills | Status: DC | PRN

## 2017-10-28 NOTE — Progress Notes (Signed)
   Subjective:    Patient ID: Valerie Friedman, female    DOB: [birth year], 42 y.o.   MRN: [id number]  HPI Here for 4 days of intermittent dizziness which is worse when she stand up, moves her head quickly, or rolls over in bed. It seems as if the room spins around her. She woke up with this one morning. She has never had this before. No headaches. No other neurologic deficits. She gets nauseated with this at times.    Review of Systems  Constitutional: Negative.   HENT: Negative.   Eyes: Negative.   Respiratory: Negative.   Cardiovascular: Negative.   Gastrointestinal: Positive for nausea.  Neurological: Positive for dizziness. Negative for tremors, seizures, syncope, facial asymmetry, speech difficulty, weakness, light-headedness, numbness and headaches.       Objective:   Physical Exam  Constitutional: She is oriented to person, place, and time. She appears well-developed and well-nourished. No distress.  Eyes: Pupils are equal, round, and reactive to light. Conjunctivae and EOM are normal.  Cardiovascular: Normal rate, regular rhythm, normal heart sounds and intact distal pulses.  Pulmonary/Chest: Effort normal and breath sounds normal.  Neurological: She is alert and oriented to person, place, and time. No cranial nerve deficit. She exhibits normal muscle tone. Coordination normal.          Assessment & Plan:  Vertigo. She will rest and drink fluids. Take Zyrtec 10 mg BID for a week or so. Add Meclizine as needed. Recheck prn.  Alysia Penna, MD

## 2017-12-09 ENCOUNTER — Ambulatory Visit (INDEPENDENT_AMBULATORY_CARE_PROVIDER_SITE_OTHER): Payer: Medicare HMO | Admitting: Family Medicine

## 2017-12-09 ENCOUNTER — Encounter: Payer: Self-pay | Admitting: Family Medicine

## 2017-12-09 VITALS — BP 140/80 | HR 86 | Wt 163.4 lb

## 2017-12-09 DIAGNOSIS — H60502 Unspecified acute noninfective otitis externa, left ear: Secondary | ICD-10-CM

## 2017-12-09 MED ORDER — CIPROFLOXACIN-DEXAMETHASONE 0.3-0.1 % OT SUSP: 4.0000 [drp] | Freq: Two times a day (BID) | OTIC | 0 refills | Status: DC

## 2017-12-09 NOTE — Progress Notes (Signed)
   Subjective:    Patient ID: Halina Maidens, female    DOB: 1975/07/14, 42 y.o.   MRN: [id number]  HPI Here for 3 days of pain in the left ear. No fever or headache or ST.    Review of Systems  Constitutional: Negative.   HENT: Positive for congestion and ear pain. Negative for postnasal drip, sinus pressure, sinus pain and sore throat.   Eyes: Negative.   Respiratory: Negative.        Objective:   Physical Exam  Constitutional: She appears well-developed and well-nourished.  HENT:  Right Ear: External ear normal.  Nose: Nose normal.  Mouth/Throat: Oropharynx is clear and moist.  Left external canal is tender but not red, a small amount of cerumen is present   Eyes: Conjunctivae are normal.  Neck: No thyromegaly present.  Pulmonary/Chest: Effort normal and breath sounds normal.  Lymphadenopathy:    She has no cervical adenopathy.          Assessment & Plan:  Otitis externa, treat with Ciprodex drops. Alysia Penna, MD

## 2018-01-08 ENCOUNTER — Ambulatory Visit: Payer: Medicare HMO | Admitting: Neurology

## 2018-03-23 ENCOUNTER — Ambulatory Visit: Payer: Medicare HMO | Admitting: Family Medicine

## 2018-03-26 ENCOUNTER — Ambulatory Visit: Payer: Medicare HMO | Admitting: Family Medicine

## 2018-03-29 ENCOUNTER — Ambulatory Visit: Payer: Medicare HMO | Admitting: Family Medicine

## 2018-05-11 ENCOUNTER — Other Ambulatory Visit: Payer: Self-pay

## 2018-05-11 ENCOUNTER — Encounter: Payer: Self-pay | Admitting: Family Medicine

## 2018-05-11 ENCOUNTER — Ambulatory Visit (INDEPENDENT_AMBULATORY_CARE_PROVIDER_SITE_OTHER): Payer: Medicare HMO | Admitting: Family Medicine

## 2018-05-11 DIAGNOSIS — H9203 Otalgia, bilateral: Secondary | ICD-10-CM | POA: Diagnosis not present

## 2018-05-11 NOTE — Progress Notes (Signed)
Subjective:    Patient ID: Valerie Friedman, female    DOB: [birth year], 43 y.o.   MRN: [id number]  HPI Virtual Visit via Video Note  I connected with the patient on 05/11/18 at  1:15 PM EDT by a video enabled telemedicine application and verified that I am speaking with the correct person using two identifiers.  Location patient: home Location provider:work or home office Persons participating in the virtual visit: patient, provider  I discussed the limitations of evaluation and management by telemedicine and the availability of in person appointments. The patient expressed understanding and agreed to proceed.   HPI: She asks for a referral to ENT for 2 days of pain and decreased hearing in both ears. She has some sinus congestion also but no fever. OTC decongestants do not help. No cough or SOB.   ROS: See pertinent positives and negatives per HPI.  Past Medical History:  Diagnosis Date  . Anxiety   . Chronic lower back pain DUE TO MYOTONIC DYSTROPHY  . Migraine   . Myotonic dystrophy ([place]) [place] 2009  ----- TYPE 1   FOLLOWED BY DR. Erling Cruz  . Numbness of fingers BILATERAL HANDS/  OCCASIONLLY RADIATES UP ARMS  . Pelvic pain in female   . Vitamin D deficiency     Past Surgical History:  Procedure Laterality Date  . BENIGN BREAST BX   FEB  2011  . BREAST BIOPSY Right 2011   Benign Stereo   . LAPAROSCOPY  05/01/2011   Procedure: LAPAROSCOPY DIAGNOSTIC;  Surgeon: Bennetta Laos, MD;  Location: [facility];  Service: Gynecology;  Laterality: N/A;  with bilateral chromopertubation, aspiration of functional left ovarian cyst, excision of two hydatid cysts  . MUSCLE BIOPSY  02-25-2007   LEFT THIGH QUADRICEP BX X3  FOR MYOSITIS  . SURG. FOR REMOVAL OF TEETH FRAGMENTS    MARCH 2012   ORAL SURGEON OFFICE    Family History  Problem Relation Age of Onset  . Diabetes Maternal Grandfather   . Heart disease Maternal Grandfather   . Diabetes Mother   .  Hypertension Father   . Hyperlipidemia Father   . Heart disease Father   . Cancer Paternal Uncle        Colon cancer  . Parkinsonism Paternal Uncle   . Heart disease Maternal Grandmother   . Heart disease Paternal Grandmother   . Stroke Paternal Grandmother   . CAD Paternal Grandmother   . Heart disease Paternal Grandfather   . Diabetes Brother   . Atrial fibrillation Brother      Current Outpatient Medications:  .  ciprofloxacin-dexamethasone (CIPRODEX) OTIC suspension, Place 4 drops into the left ear 2 (two) times daily., Disp: 7.5 mL, Rfl: 0 .  esomeprazole (NEXIUM) 40 MG capsule, Take 1 capsule (40 mg total) by mouth daily at 12 noon., Disp: 30 capsule, Rfl: 5 .  meclizine (ANTIVERT) 25 MG tablet, Take 1 tablet (25 mg total) by mouth every 4 (four) hours as needed for dizziness., Disp: 60 tablet, Rfl: 2  EXAM:  VITALS per patient if applicable:  GENERAL: alert, oriented, appears well and in no acute distress  HEENT: atraumatic, conjunttiva clear, no obvious abnormalities on inspection of external nose and ears  NECK: normal movements of the head and neck  LUNGS: on inspection no signs of respiratory distress, breathing rate appears normal, no obvious gross SOB, gasping or wheezing  CV: no obvious cyanosis  MS: moves all visible extremities without noticeable abnormality  PSYCH/NEURO:  pleasant and cooperative, no obvious depression or anxiety, speech and thought processing grossly intact  ASSESSMENT AND PLAN: Otalgia, we will refer her to ENT.  Alysia Penna, MD  Discussed the following assessment and plan:  Otalgia of both ears - Plan: Ambulatory referral to ENT     I discussed the assessment and treatment plan with the patient. The patient was provided an opportunity to ask questions and all were answered. The patient agreed with the plan and demonstrated an understanding of the instructions.   The patient was advised to call back or seek an in-person evaluation  if the symptoms worsen or if the condition fails to improve as anticipated.     Review of Systems     Objective:   Physical Exam        Assessment & Plan:

## 2018-06-04 DIAGNOSIS — Z87891 Personal history of nicotine dependence: Secondary | ICD-10-CM | POA: Diagnosis not present

## 2018-06-04 DIAGNOSIS — H938X2 Other specified disorders of left ear: Secondary | ICD-10-CM | POA: Diagnosis not present

## 2018-06-04 DIAGNOSIS — H6123 Impacted cerumen, bilateral: Secondary | ICD-10-CM | POA: Diagnosis not present

## 2018-06-04 DIAGNOSIS — Z8669 Personal history of other diseases of the nervous system and sense organs: Secondary | ICD-10-CM | POA: Diagnosis not present

## 2018-09-02 ENCOUNTER — Encounter: Payer: Medicare HMO | Admitting: Gynecology

## 2018-09-17 ENCOUNTER — Encounter: Payer: Medicare HMO | Admitting: Gynecology

## 2018-10-05 ENCOUNTER — Encounter: Payer: Self-pay | Admitting: Gynecology

## 2019-03-29 ENCOUNTER — Other Ambulatory Visit: Payer: Self-pay

## 2019-03-29 ENCOUNTER — Encounter: Payer: Medicare HMO | Admitting: Obstetrics and Gynecology

## 2019-04-14 ENCOUNTER — Encounter: Payer: Medicare HMO | Admitting: Obstetrics and Gynecology

## 2019-05-10 ENCOUNTER — Other Ambulatory Visit: Payer: Self-pay

## 2019-05-11 ENCOUNTER — Encounter: Payer: Medicare HMO | Admitting: Obstetrics and Gynecology

## 2019-06-09 ENCOUNTER — Encounter: Payer: Self-pay | Admitting: Family Medicine

## 2019-06-09 ENCOUNTER — Telehealth (INDEPENDENT_AMBULATORY_CARE_PROVIDER_SITE_OTHER): Payer: Medicare HMO | Admitting: Family Medicine

## 2019-06-09 VITALS — Ht 62.75 in | Wt 130.0 lb

## 2019-06-09 DIAGNOSIS — R05 Cough: Secondary | ICD-10-CM | POA: Diagnosis not present

## 2019-06-09 DIAGNOSIS — R0981 Nasal congestion: Secondary | ICD-10-CM | POA: Diagnosis not present

## 2019-06-09 DIAGNOSIS — J309 Allergic rhinitis, unspecified: Secondary | ICD-10-CM | POA: Diagnosis not present

## 2019-06-09 DIAGNOSIS — R519 Headache, unspecified: Secondary | ICD-10-CM

## 2019-06-09 DIAGNOSIS — Z8669 Personal history of other diseases of the nervous system and sense organs: Secondary | ICD-10-CM

## 2019-06-09 DIAGNOSIS — R059 Cough, unspecified: Secondary | ICD-10-CM

## 2019-06-09 MED ORDER — AMOXICILLIN-POT CLAVULANATE 875-125 MG PO TABS: 1.0000 | ORAL_TABLET | Freq: Two times a day (BID) | ORAL | 0 refills | Status: DC

## 2019-06-09 NOTE — Patient Instructions (Signed)
-  I sent the medication(s) we discussed to your pharmacy: Meds ordered this encounter  Medications  . amoxicillin-clavulanate (AUGMENTIN) 875-125 MG tablet    Sig: Take 1 tablet by mouth 2 (two) times daily.    Dispense:  20 tablet    Refill:  0    Please let us know if you have any questions or concerns regarding this prescription.  I hope you are feeling better soon! Seek care promptly if your symptoms worsen, new concerns arise or you are not improving with treatment.  

## 2019-06-09 NOTE — Progress Notes (Signed)
Virtual Visit via Video Note  I connected with   on 06/09/19 at 11:20 AM EDT by a video enabled telemedicine application and verified that I am speaking with the correct person using two identifiers.  Location patient: home Location provider:work or home office Persons participating in the virtual visit: patient, provider  I discussed the limitations of evaluation and management by telemedicine and the availability of in person appointments. The patient expressed understanding and agreed to proceed.   HPI:  Acute visit for sinus issues:  - she has chronic pnd and issues with allergies, but worse the last 6 days  -symptoms include sinus congestion, stuffy nose, PND, L facial pain worse with leaning forward, coughing, sneezing -denies fevers, NVD, loss of taste or smell, sick exposures, change in baseline chronic SOB -she has only been out of her house once last week to grocery shot - she wore a mask, though she saw others shopping without masks -she has not had her covid19 vaccine -she has a history of migraines with ocular symptoms - this facial pain has not been as bad as prior migraines, no worse headache of life -she has been taking tylenol - which helps, she also tried hydrocodone she has left over from the dentist and that did not help, she also tried zyrtec  -the medicines help, but when the medicines wear off the pain comes back  ROS: See pertinent positives and negatives per HPI.  Past Medical History:  Diagnosis Date  . Anxiety   . Chronic lower back pain DUE TO MYOTONIC DYSTROPHY  . Migraine   . Myotonic dystrophy ([place]) [place] 2009  ----- TYPE 1   FOLLOWED BY DR. Erling Cruz  . Numbness of fingers BILATERAL HANDS/  OCCASIONLLY RADIATES UP ARMS  . Pelvic pain in female   . Vitamin D deficiency     Past Surgical History:  Procedure Laterality Date  . BENIGN BREAST BX   FEB  2011  . BREAST BIOPSY Right 2011   Benign Stereo   . LAPAROSCOPY  05/01/2011   Procedure:  LAPAROSCOPY DIAGNOSTIC;  Surgeon: Bennetta Laos, MD;  Location: [facility];  Service: Gynecology;  Laterality: N/A;  with bilateral chromopertubation, aspiration of functional left ovarian cyst, excision of two hydatid cysts  . MUSCLE BIOPSY  02-25-2007   LEFT THIGH QUADRICEP BX X3  FOR MYOSITIS  . SURG. FOR REMOVAL OF TEETH FRAGMENTS    MARCH 2012   ORAL SURGEON OFFICE    Family History  Problem Relation Age of Onset  . Diabetes Maternal Grandfather   . Heart disease Maternal Grandfather   . Diabetes Mother   . Hypertension Father   . Hyperlipidemia Father   . Heart disease Father   . Cancer Paternal Uncle        Colon cancer  . Parkinsonism Paternal Uncle   . Heart disease Maternal Grandmother   . Heart disease Paternal Grandmother   . Stroke Paternal Grandmother   . CAD Paternal Grandmother   . Heart disease Paternal Grandfather   . Diabetes Brother   . Atrial fibrillation Brother     SOCIAL HX: see hpi   Current Outpatient Medications:  .  Acetaminophen (TYLENOL PO), Take by mouth as needed., Disp: , Rfl:  .  cetirizine (ZYRTEC) 10 MG tablet, Take 10 mg by mouth daily., Disp: , Rfl:  .  meclizine (ANTIVERT) 25 MG tablet, Take 1 tablet (25 mg total) by mouth every 4 (four) hours as needed for dizziness., Disp:  60 tablet, Rfl: 2 .  amoxicillin-clavulanate (AUGMENTIN) 875-125 MG tablet, Take 1 tablet by mouth 2 (two) times daily., Disp: 20 tablet, Rfl: 0  EXAM:  VITALS per patient if applicable:  GENERAL: alert, oriented, appears well and in no acute distress  HEENT: atraumatic, conjunttiva clear, no obvious abnormalities on inspection of external nose and ears, she points to L maxillary facial area around the L eye as worse area   NECK: normal movements of the head and neck  LUNGS: on inspection no signs of respiratory distress, breathing rate appears normal, no obvious gross SOB, gasping or wheezing, no coughing during the visit  CV: no obvious  cyanosis  MS: moves all visible extremities without noticeable abnormality  PSYCH/NEURO: pleasant and cooperative, no obvious depression or anxiety, speech and thought processing grossly intact  ASSESSMENT AND PLAN:  Discussed the following assessment and plan:  Sinus congestion  Cough  Facial pain  History of migraine  Allergic rhinitis, unspecified seasonality, unspecified trigger  -we discussed possible serious and likely etiologies, options for evaluation and workup, limitations of telemedicine visit vs in person visit, treatment, treatment risks and precautions. Pt prefers to treat via telemedicine empirically rather then risking or undertaking an in person visit at this moment. Query sinusitis given chronic sinus congestion and now worse with facial pain. Discussed other potential causes as well, some serious. She prefers to try an empiric antibiotic Augmentin 875 bid x 7-10 days, topical menthol and tylenol in safe dosing amounts as needed, allergy regimen first. Advised prompt in person evaluation if not improving over the next 24-48 hours. Patient agrees to seek prompt in person care if worsening, new symptoms arise, or if is not improving with treatment.   I discussed the assessment and treatment plan with the patient. The patient was provided an opportunity to ask questions and all were answered. The patient agreed with the plan and demonstrated an understanding of the instructions.   The patient was advised to call back or seek an in-person evaluation if the symptoms worsen or if the condition fails to improve as anticipated.   Lucretia Kern, DO

## 2020-01-10 ENCOUNTER — Ambulatory Visit: Payer: Self-pay

## 2020-01-10 ENCOUNTER — Other Ambulatory Visit: Payer: Self-pay

## 2020-01-10 ENCOUNTER — Ambulatory Visit (INDEPENDENT_AMBULATORY_CARE_PROVIDER_SITE_OTHER): Payer: Medicare HMO | Admitting: Family Medicine

## 2020-01-10 ENCOUNTER — Ambulatory Visit (INDEPENDENT_AMBULATORY_CARE_PROVIDER_SITE_OTHER): Payer: Medicare HMO

## 2020-01-10 ENCOUNTER — Encounter: Payer: Self-pay | Admitting: Family Medicine

## 2020-01-10 VITALS — BP 124/80 | HR 81 | Ht 62.75 in | Wt 153.0 lb

## 2020-01-10 DIAGNOSIS — M79645 Pain in left finger(s): Secondary | ICD-10-CM

## 2020-01-10 NOTE — Progress Notes (Signed)
    Subjective:    CC: L ring finger pain  I, Molly Weber, LAT, ATC, am serving as scribe for Dr. Clementeen Graham.  HPI: Pt is a 44 y/o female presenting w/ c/o L ring finger pain since Christmas day when her L ring finger got pushed into hyperextension.  She locates her pain to her L ring finger DIP.  She was previously seen by Dr. Katrinka Blazing in 2018 for L leg pain and associated muscular dystrophy symptoms.  L finger/hand swelling: yes, minimal Bruising: yes Aggravating factors: attempts at L ring finger DIP flexion; pressure to the L ring finger fingernail Treatments tried: foam splint; Tylenol  Pertinent review of Systems: No fevers or chills  Relevant historical information: Myotonic muscle dystrophy.   Objective:    Vitals:   01/10/20 1429  BP: 124/80  Pulse: 81  SpO2: 95%   General: Well Developed, well nourished, and in no acute distress.   MSK: Left fourth digit mild swelling at DIP.  Tender palpation at DIP and PIP.  Motion intact DIP and PIP.  Intact strength to extension and flexion at DIP and PIP.  Lab and Radiology Results  X-ray images left fourth digit obtained today personally and independently interpreted No fractures Await formal radiology review  Diagnostic Limited MSK Ultrasound of: Left fourth digit DIP.  No cortical defect visible.  Intact flexor and extensor tendon.  Slight joint effusion at DIP seen at radial and ulnar side of joint. PIP intact appearing Impression: No obvious tendon disruption dorsal and volar DIP PIP fourth digit.    Impression and Recommendations:    Assessment and Plan: 44 y.o. female with fourth digit injury likely traumatic synovitis.  Doubtful from significant tendon injury.  Plan for STAX splint, Voltaren gel, and relative rest.  Start home exercise program if not better in a few weeks.  Recheck as needed.Marland Kitchen  PDMP not reviewed this encounter. Orders Placed This Encounter  Procedures  . [place] LIMITED JOINT SPACE STRUCTURES  UP LEFT(NO LINKED CHARGES)    Order Specific Question:   Reason for Exam (SYMPTOM  OR DIAGNOSIS REQUIRED)    Answer:   L finger pain    Order Specific Question:   Preferred imaging location?    Answer:   Adult [facility]  . DG Finger Ring Left    Standing Status:   Future    Number of Occurrences:   1    Standing Expiration Date:   02/10/2020    Order Specific Question:   Reason for Exam (SYMPTOM  OR DIAGNOSIS REQUIRED)    Answer:   L ring finger pain    Order Specific Question:   Is patient pregnant?    Answer:   No    Order Specific Question:   Preferred imaging location?    Answer:   [facility]   No orders of the defined types were placed in this encounter.   Discussed warning signs or symptoms. Please see discharge instructions. Patient expresses understanding.   The above documentation has been reviewed and is accurate and complete Clementeen Graham, M.D.

## 2020-01-10 NOTE — Patient Instructions (Signed)
Thank you for coming in today.  Use the STAX splint.  You have a size 2 now. You may go to a size 1 in a week or two when the swelling goes down.   I am not sure how long you need to wear it.  Probably a few weeks assuming no surprises on the xray today.   Please get an Xray today before you leave   Voltaren gel may help as well.

## 2020-01-11 NOTE — Progress Notes (Signed)
No fracture is visible.

## 2020-01-14 DIAGNOSIS — Z8489 Family history of other specified conditions: Secondary | ICD-10-CM

## 2020-01-14 HISTORY — DX: Family history of other specified conditions: Z84.89

## 2020-02-23 ENCOUNTER — Other Ambulatory Visit: Payer: Medicare HMO

## 2020-02-23 DIAGNOSIS — Z20822 Contact with and (suspected) exposure to covid-19: Secondary | ICD-10-CM | POA: Diagnosis not present

## 2020-02-24 LAB — SARS-COV-2, NAA 2 DAY TAT

## 2020-02-24 LAB — NOVEL CORONAVIRUS, NAA: SARS-CoV-2, NAA: NOT DETECTED

## 2020-03-14 ENCOUNTER — Telehealth: Payer: Self-pay | Admitting: Family Medicine

## 2020-03-14 NOTE — Telephone Encounter (Signed)
Spoke with patient to  schedule Medicare Annual Wellness Visit (AWV) either virtually or in office.  She will call back mom just passed away and brother in hospital   AWVI  please schedule at anytime with [facility] Nurse Health Advisor 1 or 2   This should be a 45 minute visit.

## 2020-06-12 ENCOUNTER — Other Ambulatory Visit: Payer: Self-pay

## 2020-06-13 ENCOUNTER — Encounter: Payer: Self-pay | Admitting: Family Medicine

## 2020-06-13 ENCOUNTER — Ambulatory Visit: Payer: Medicare HMO | Admitting: Family Medicine

## 2020-06-13 ENCOUNTER — Ambulatory Visit (INDEPENDENT_AMBULATORY_CARE_PROVIDER_SITE_OTHER): Payer: 59 | Admitting: Family Medicine

## 2020-06-13 ENCOUNTER — Telehealth: Payer: Self-pay | Admitting: Family Medicine

## 2020-06-13 VITALS — BP 110/70 | HR 87 | Temp 98.5°F | Wt 134.0 lb

## 2020-06-13 DIAGNOSIS — S92902D Unspecified fracture of left foot, subsequent encounter for fracture with routine healing: Secondary | ICD-10-CM | POA: Diagnosis not present

## 2020-06-13 MED ORDER — KETOROLAC TROMETHAMINE 60 MG/2ML IM SOLN
60.0000 mg | Freq: Once | INTRAMUSCULAR | Status: AC
  Administered 2020-06-13: 60 mg via INTRAMUSCULAR

## 2020-06-13 MED ORDER — OXYCODONE-ACETAMINOPHEN 5-325 MG PO TABS: 1.0000 | ORAL_TABLET | ORAL | 0 refills | Status: AC | PRN

## 2020-06-13 NOTE — Progress Notes (Signed)
   Subjective:    Patient ID: Valerie Friedman, female    DOB: [birth year], 45 y.o.   MRN: [id number]  HPI Here with her father to follow up an ED visit to the [facility] in [place] on 06-10-20. That day while walking her dog she stepped into a hole in the ground, injuring her left foot. Xrays revealed multiple closed fractures to the left foot bones as well as likely tendon and ligament damage. I do not have access to the actual results. The foot was placed oin a soft cast and was wrapped with elastic bandages. She was given a few Percocet for pain as well as a Dilaudid shot. She was told to see a specialist when she gets back here. She has been avoiding any weight bearing. She has a fair amount of pain.    Review of Systems  Constitutional: Negative.   Respiratory: Negative.   Cardiovascular: Negative.   Musculoskeletal: Positive for arthralgias.       Objective:   Physical Exam Constitutional:      Comments: In a wheelchair   Cardiovascular:     Rate and Rhythm: Normal rate and regular rhythm.     Pulses: Normal pulses.     Heart sounds: Normal heart sounds.  Pulmonary:     Effort: Pulmonary effort is normal.     Breath sounds: Normal breath sounds.  Musculoskeletal:     Comments: The left foot and lower leg are wrapped as above   Neurological:     Mental Status: She is alert.           Assessment & Plan:  Multiple foot fractures. She is given a Toradol shot and #30 of Percocet for pain control. We will refer her urgently to Dr. Meridee Score (lower extremity Orthopedics).  Alysia Penna, MD

## 2020-06-13 NOTE — Telephone Encounter (Signed)
She wants to know if the shot she was given today will make her sleepy or drowsy.   She would like a call at [phone] or  Father's number [phone].  Please advise.

## 2020-06-14 ENCOUNTER — Ambulatory Visit (INDEPENDENT_AMBULATORY_CARE_PROVIDER_SITE_OTHER): Payer: 59 | Admitting: Orthopedic Surgery

## 2020-06-14 ENCOUNTER — Ambulatory Visit: Payer: Self-pay

## 2020-06-14 ENCOUNTER — Encounter: Payer: Self-pay | Admitting: Orthopedic Surgery

## 2020-06-14 DIAGNOSIS — M79672 Pain in left foot: Secondary | ICD-10-CM

## 2020-06-14 DIAGNOSIS — S93325A Dislocation of tarsometatarsal joint of left foot, initial encounter: Secondary | ICD-10-CM | POA: Diagnosis not present

## 2020-06-14 DIAGNOSIS — S92302A Fracture of unspecified metatarsal bone(s), left foot, initial encounter for closed fracture: Secondary | ICD-10-CM

## 2020-06-14 NOTE — Telephone Encounter (Signed)
This is not a typical reaction, but yes it is possible. If so, it should wear off in a day or two

## 2020-06-14 NOTE — Telephone Encounter (Signed)
Pt has been notified, verbalized understanding

## 2020-06-14 NOTE — Telephone Encounter (Signed)
Called pt back stated that she has been sleepy and drowsy since she had Toradol injection at her visit yesterday, pt wanted to know if this could be from the injection. Please advise

## 2020-06-14 NOTE — Progress Notes (Signed)
Office Visit Note   Patient: Valerie Friedman           Date of Birth: [birth year]           MRN: [id number] Visit Date: 06/14/2020              Requested by: Laurey Morale, MD [place],  [place] [id number] PCP: Laurey Morale, MD  Chief Complaint  Patient presents with  . Left Foot - Injury, Pain    DOI 06/09/2020      HPI: Patient is a 45 year old woman who states she stepped in a hole this Saturday at [facility].  Emergency [place] had radiographs and a CT scan obtained but she states she did not get a disc or bring the report with her.  She currently has Percocet for pain she was placed in a posterior splint.  Patient complains of dependent ecchymosis and bruising.  Assessment & Plan: Visit Diagnoses:  1. Pain in left foot   2. Closed traumatic displaced fracture of metatarsal bone of left foot, initial encounter   3. Lisfranc dislocation, left, initial encounter     Plan: Clinically and radiographically patient has a Lisfranc injury as well as metatarsal neck fractures 3 and 4.  I will set her up for a CT scan to better identify the pathology across the Lisfranc complex.  Plan to follow-up on Monday after the CT scan is obtained discussed with the patient and her father that we would most likely proceed with surgery for the Lisfranc injury and would not need to proceed with internal fixation for the metatarsal neck fractures.  Patient states that she has been letting her foot dependent without ice.  Discussed the importance of elevating her foot level with her heart at all times using ice 20 minutes at a time and being careful not to freeze the skin and continue with her Percocet.  Recommend that she get a kneeling scooter.  Follow-Up Instructions: Return in about 1 week (around 06/21/2020).   Ortho Exam  Patient is alert, oriented, no adenopathy, well-dressed, normal affect, normal respiratory effort. Examination patient has dependent ecchymosis in the  Med[place] tarsal head region of the left foot as well as medially over the calcaneus.  Patient states she has been letting her foot hang dependent.  She has a good dorsalis pedis and posterior tibial pulse she is very tender to palpation across the Lisfranc complex and across the metatarsal necks.  There is no skin breakdown no cellulitis.  Patient has active plantar flexion dorsiflexion of both ankles.  Imaging: XR Foot Complete Left  Result Date: 06/14/2020 Three-view radiographs of the left foot shows avulsion fractures of the base of the second and third metatarsals as well as metatarsal neck fractures metatarsal 3 and 4.  No images are attached to the encounter.  Labs: Lab Results  Component Value Date   HGBA1C 5.8 (H) 01/01/2017   HGBA1C 5.9 04/17/2006   ESRSEDRATE 40 (H) 06/22/2017   ESRSEDRATE 21 (H) 03/26/2016   CRP 0.5 06/22/2017   CRP 0.2 (L) 03/26/2016     Lab Results  Component Value Date   ALBUMIN 4.1 06/15/2017   ALBUMIN 4.2 06/18/2016   ALBUMIN 4.3 03/26/2016    Lab Results  Component Value Date   MG 2.3 06/22/2017   Lab Results  Component Value Date   VD25OH 23 (L) 04/21/2016   VD25OH 17.58 (L) 03/26/2016    No results found for: PREALBUMIN CBC EXTENDED Latest  Ref Rng & Units 06/15/2017 03/26/2016 03/07/2015  WBC 4.0 - 10.5 K/uL 7.8 7.0 9.1  RBC 3.87 - 5.11 Mil/uL 4.32 4.91 4.91  HGB 12.0 - 15.0 g/dL 12.8 14.7 14.7  HCT 36.0 - 46.0 % 39.3 44.8 44.9  PLT 150.0 - 400.0 K/uL 231.0 259.0 243.0  NEUTROABS 1.4 - 7.7 K/uL 5.5 5.2 7.0  LYMPHSABS 0.7 - 4.0 K/uL 1.7 1.3 1.7     There is no height or weight on file to calculate BMI.  Orders:  Orders Placed This Encounter  Procedures  . XR Foot Complete Left  . XR Ankle Complete Left   No orders of the defined types were placed in this encounter.    Procedures: No procedures performed  Clinical Data: No additional findings.  ROS:  All other systems negative, except as noted in the HPI. Review of  Systems  Objective: Vital Signs: There were no vitals taken for this visit.  Specialty Comments:  No specialty comments available.  PMFS History: Patient Active Problem List   Diagnosis Date Noted  . Vertigo 10/28/2017  . GERD (gastroesophageal reflux disease) 06/15/2017  . Circadian rhythm sleep disorder 04/30/2017  . OSA (obstructive sleep apnea) 04/30/2017  . Vitamin D deficiency 04/16/2016  . Foot drop, right 03/26/2016  . Anxiety   . Myotonic dystrophy ([place])   . Fibromyalgia   . Migraine   . WEIGHT LOSS 07/11/2008  . HEADACHE 07/11/2008  . NICOTINE ADDICTION 10/20/2007  . MYOTONIC MUSCULAR DYSTROPHY 09/24/2007  . MYALGIA/MYOSITIS NOS 08/03/2006  . Depression 06/23/2006   Past Medical History:  Diagnosis Date  . Anxiety   . Chronic lower back pain DUE TO MYOTONIC DYSTROPHY  . Migraine   . Myotonic dystrophy ([place]) [place] 2009  ----- TYPE 1   FOLLOWED BY DR. Erling Cruz  . Numbness of fingers BILATERAL HANDS/  OCCASIONLLY RADIATES UP ARMS  . Pelvic pain in female   . Vitamin D deficiency     Family History  Problem Relation Age of Onset  . Diabetes Maternal Grandfather   . Heart disease Maternal Grandfather   . Diabetes Mother   . Hypertension Father   . Hyperlipidemia Father   . Heart disease Father   . Cancer Paternal Uncle        Colon cancer  . Parkinsonism Paternal Uncle   . Heart disease Maternal Grandmother   . Heart disease Paternal Grandmother   . Stroke Paternal Grandmother   . CAD Paternal Grandmother   . Heart disease Paternal Grandfather   . Diabetes Brother   . Atrial fibrillation Brother     Past Surgical History:  Procedure Laterality Date  . BENIGN BREAST BX   FEB  2011  . BREAST BIOPSY Right 2011   Benign Stereo   . LAPAROSCOPY  05/01/2011   Procedure: LAPAROSCOPY DIAGNOSTIC;  Surgeon: Bennetta Laos, MD;  Location[facility];  Service: Gynecology;  Laterality: N/A;  with bilateral chromopertubation, aspiration of  functional left ovarian cyst, excision of two hydatid cysts  . MUSCLE BIOPSY  02-25-2007   LEFT THIGH QUADRICEP BX X3  FOR MYOSITIS  . SURG. FOR REMOVAL OF TEETH FRAGMENTS    MARCH 2012   ORAL SURGEON OFFICE   Social History   Occupational History  . Not on file  Tobacco Use  . Smoking status: Former Smoker    Packs/day: 0.25    Years: 10.00    Pack years: 2.50    Types: Cigarettes    Quit date:  06/28/2016    Years since quitting: 3.9  . Smokeless tobacco: Never Used  . Tobacco comment: varies, 0-0.5 pack per day  Vaping Use  . Vaping Use: Never used  Substance and Sexual Activity  . Alcohol use: Yes    Alcohol/week: 0.0 standard drinks    Comment: rare  . Drug use: No  . Sexual activity: Yes    Birth control/protection: None

## 2020-06-15 ENCOUNTER — Telehealth: Payer: Self-pay | Admitting: Orthopedic Surgery

## 2020-06-15 NOTE — Telephone Encounter (Signed)
Pt's husband called wondering if he can get the information of where they are being referred to for the CT scan. Pt had an appt with Sharol Given on 06/14/20 and has a follow up to review the ct scan on  06/19/20. The best call back number is [phone].

## 2020-06-15 NOTE — Telephone Encounter (Signed)
Pt has [place] and medicaid and I asked scheduling if this would require precet. Advised that it soul not and that they can call [facility] imaging to schedule. OI called and sw pt's father and advised to call [phone] will call with any questions.

## 2020-06-18 ENCOUNTER — Ambulatory Visit
Admission: RE | Admit: 2020-06-18 | Discharge: 2020-06-18 | Disposition: A | Discharge status: Home / Self Care | Payer: 59 | Source: Ambulatory Visit | Attending: Orthopedic Surgery | Admitting: Orthopedic Surgery

## 2020-06-18 DIAGNOSIS — S92302A Fracture of unspecified metatarsal bone(s), left foot, initial encounter for closed fracture: Secondary | ICD-10-CM

## 2020-06-18 DIAGNOSIS — S93325A Dislocation of tarsometatarsal joint of left foot, initial encounter: Secondary | ICD-10-CM

## 2020-06-19 ENCOUNTER — Encounter: Payer: Self-pay | Admitting: Orthopedic Surgery

## 2020-06-19 ENCOUNTER — Ambulatory Visit (INDEPENDENT_AMBULATORY_CARE_PROVIDER_SITE_OTHER): Payer: 59 | Admitting: Orthopedic Surgery

## 2020-06-19 DIAGNOSIS — S93325A Dislocation of tarsometatarsal joint of left foot, initial encounter: Secondary | ICD-10-CM

## 2020-06-19 NOTE — Progress Notes (Signed)
Office Visit Note   Patient: Valerie Friedman           Date of Birth: 1975-03-16           MRN: [id number] Visit Date: 06/19/2020              Requested by: Laurey Morale, MD [place],  [place] [address] PCP: Laurey Morale, MD  Chief Complaint  Patient presents with  . Left Foot - Follow-up    CT scan review       HPI: Patient is a 45 year old woman who presents in follow-up for CT scan left foot status post Lisfranc fracture dislocation as well as metatarsal neck fractures.  Patient states she has been keeping her foot elevated 21 hours a day she has been using the fracture boot.  Assessment & Plan: Visit Diagnoses:  1. Lisfranc dislocation, left, initial encounter     Plan: We reviewed her CT scan and recommended proceeding with internal fixation across the Lisfranc complex left foot.  Risks and benefits were discussed including infection persistent pain nonhealing of the wound and need for additional surgery.  Patient states she understands wished to proceed at this time.  She would like to proceed with outpatient surgery on Friday.  Follow-Up Instructions: Return in about 2 weeks (around 07/03/2020).   Ortho Exam  Patient is alert, oriented, no adenopathy, well-dressed, normal affect, normal respiratory effort. Examination the swelling and bruising is resolving in the left foot.  Patient has a good pulse.  Review of the CT scan shows intra-articular fractures across the Lisfranc joint involving the second and third metatarsal bases as well as involving the medial and lateral cuneiforms.  There is also displaced fractures across the metatarsal necks 3 and 4.  Imaging: No results found. No images are attached to the encounter.  Labs: Lab Results  Component Value Date   HGBA1C 5.8 (H) 01/01/2017   HGBA1C 5.9 04/17/2006   ESRSEDRATE 40 (H) 06/22/2017   ESRSEDRATE 21 (H) 03/26/2016   CRP 0.5 06/22/2017   CRP 0.2 (L) 03/26/2016     Lab Results   Component Value Date   ALBUMIN 4.1 06/15/2017   ALBUMIN 4.2 06/18/2016   ALBUMIN 4.3 03/26/2016    Lab Results  Component Value Date   MG 2.3 06/22/2017   Lab Results  Component Value Date   VD25OH 23 (L) 04/21/2016   VD25OH 17.58 (L) 03/26/2016    No results found for: PREALBUMIN CBC EXTENDED Latest Ref Rng & Units 06/15/2017 03/26/2016 03/07/2015  WBC 4.0 - 10.5 K/uL 7.8 7.0 9.1  RBC 3.87 - 5.11 Mil/uL 4.32 4.91 4.91  HGB 12.0 - 15.0 g/dL 12.8 14.7 14.7  HCT 36.0 - 46.0 % 39.3 44.8 44.9  PLT 150.0 - 400.0 K/uL 231.0 259.0 243.0  NEUTROABS 1.4 - 7.7 K/uL 5.5 5.2 7.0  LYMPHSABS 0.7 - 4.0 K/uL 1.7 1.3 1.7     There is no height or weight on file to calculate BMI.  Orders:  No orders of the defined types were placed in this encounter.  No orders of the defined types were placed in this encounter.    Procedures: No procedures performed  Clinical Data: No additional findings.  ROS:  All other systems negative, except as noted in the HPI. Review of Systems  Objective: Vital Signs: There were no vitals taken for this visit.  Specialty Comments:  No specialty comments available.  PMFS History: Patient Active Problem List   Diagnosis  Date Noted  . Vertigo 10/28/2017  . GERD (gastroesophageal reflux disease) 06/15/2017  . Circadian rhythm sleep disorder 04/30/2017  . OSA (obstructive sleep apnea) 04/30/2017  . Vitamin D deficiency 04/16/2016  . Foot drop, right 03/26/2016  . Anxiety   . Myotonic dystrophy ([place])   . Fibromyalgia   . Migraine   . WEIGHT LOSS 07/11/2008  . HEADACHE 07/11/2008  . NICOTINE ADDICTION 10/20/2007  . MYOTONIC MUSCULAR DYSTROPHY 09/24/2007  . MYALGIA/MYOSITIS NOS 08/03/2006  . Depression 06/23/2006   Past Medical History:  Diagnosis Date  . Anxiety   . Chronic lower back pain DUE TO MYOTONIC DYSTROPHY  . Migraine   . Myotonic dystrophy ([place]) [place] 2009  ----- TYPE 1   FOLLOWED BY DR. Erling Cruz  . Numbness of fingers BILATERAL  HANDS/  OCCASIONLLY RADIATES UP ARMS  . Pelvic pain in female   . Vitamin D deficiency     Family History  Problem Relation Age of Onset  . Diabetes Maternal Grandfather   . Heart disease Maternal Grandfather   . Diabetes Mother   . Hypertension Father   . Hyperlipidemia Father   . Heart disease Father   . Cancer Paternal Uncle        Colon cancer  . Parkinsonism Paternal Uncle   . Heart disease Maternal Grandmother   . Heart disease Paternal Grandmother   . Stroke Paternal Grandmother   . CAD Paternal Grandmother   . Heart disease Paternal Grandfather   . Diabetes Brother   . Atrial fibrillation Brother     Past Surgical History:  Procedure Laterality Date  . BENIGN BREAST BX   FEB  2011  . BREAST BIOPSY Right 2011   Benign Stereo   . LAPAROSCOPY  05/01/2011   Procedure: LAPAROSCOPY DIAGNOSTIC;  Surgeon: Bennetta Laos, MD;  Location: [facility];  Service: Gynecology;  Laterality: N/A;  with bilateral chromopertubation, aspiration of functional left ovarian cyst, excision of two hydatid cysts  . MUSCLE BIOPSY  02-25-2007   LEFT THIGH QUADRICEP BX X3  FOR MYOSITIS  . SURG. FOR REMOVAL OF TEETH FRAGMENTS    MARCH 2012   ORAL SURGEON OFFICE   Social History   Occupational History  . Not on file  Tobacco Use  . Smoking status: Former Smoker    Packs/day: 0.25    Years: 10.00    Pack years: 2.50    Types: Cigarettes    Quit date: 06/28/2016    Years since quitting: 3.9  . Smokeless tobacco: Never Used  . Tobacco comment: varies, 0-0.5 pack per day  Vaping Use  . Vaping Use: Never used  Substance and Sexual Activity  . Alcohol use: Yes    Alcohol/week: 0.0 standard drinks    Comment: rare  . Drug use: No  . Sexual activity: Yes    Birth control/protection: None

## 2020-06-20 ENCOUNTER — Other Ambulatory Visit: Payer: Self-pay

## 2020-06-20 ENCOUNTER — Encounter (HOSPITAL_COMMUNITY): Payer: Self-pay | Admitting: Orthopedic Surgery

## 2020-06-20 ENCOUNTER — Other Ambulatory Visit: Payer: Self-pay | Admitting: Physician Assistant

## 2020-06-20 ENCOUNTER — Encounter (HOSPITAL_COMMUNITY)
Admission: RE | Admit: 2020-06-20 | Discharge: 2020-06-20 | Disposition: A | Discharge status: Home / Self Care | Payer: 59 | Source: Home / Self Care | Attending: Orthopedic Surgery | Admitting: Orthopedic Surgery

## 2020-06-20 DIAGNOSIS — Z9102 Food additives allergy status: Secondary | ICD-10-CM | POA: Diagnosis not present

## 2020-06-20 DIAGNOSIS — Z881 Allergy status to other antibiotic agents status: Secondary | ICD-10-CM | POA: Diagnosis not present

## 2020-06-20 DIAGNOSIS — Z5309 Procedure and treatment not carried out because of other contraindication: Secondary | ICD-10-CM | POA: Diagnosis not present

## 2020-06-20 DIAGNOSIS — Z833 Family history of diabetes mellitus: Secondary | ICD-10-CM | POA: Diagnosis not present

## 2020-06-20 DIAGNOSIS — Z87891 Personal history of nicotine dependence: Secondary | ICD-10-CM | POA: Diagnosis not present

## 2020-06-20 DIAGNOSIS — Z01812 Encounter for preprocedural laboratory examination: Secondary | ICD-10-CM | POA: Insufficient documentation

## 2020-06-20 DIAGNOSIS — Z888 Allergy status to other drugs, medicaments and biological substances status: Secondary | ICD-10-CM | POA: Diagnosis not present

## 2020-06-20 DIAGNOSIS — Z91040 Latex allergy status: Secondary | ICD-10-CM | POA: Diagnosis not present

## 2020-06-20 DIAGNOSIS — Z886 Allergy status to analgesic agent status: Secondary | ICD-10-CM | POA: Diagnosis not present

## 2020-06-20 DIAGNOSIS — Z8249 Family history of ischemic heart disease and other diseases of the circulatory system: Secondary | ICD-10-CM | POA: Diagnosis not present

## 2020-06-20 DIAGNOSIS — X58XXXA Exposure to other specified factors, initial encounter: Secondary | ICD-10-CM | POA: Diagnosis not present

## 2020-06-20 DIAGNOSIS — S92302A Fracture of unspecified metatarsal bone(s), left foot, initial encounter for closed fracture: Secondary | ICD-10-CM | POA: Diagnosis present

## 2020-06-20 NOTE — Progress Notes (Signed)
DUE TO COVID-19 ONLY ONE VISITOR IS ALLOWED TO COME WITH YOU AND STAY IN THE WAITING ROOM ONLY DURING PRE OP AND PROCEDURE DAY OF SURGERY.   PCP - Dr Cherlynn Perches Cardiologist - n/a  Chest x-ray - n/a EKG - n/a Stress Test - n/a ECHO - n/a Cardiac Cath - n/a  Sleep Study - Yes CPAP - does not use cpap  ERAS: Clear liquids til 11 am on DOS.  Anesthesia review: Yes, Hx myotonic dystrophy  STOP now taking any Aspirin (unless otherwise instructed by your surgeon), Aleve, Naproxen, Ibuprofen, Motrin, Advil, Goody's, BC's, all herbal medications, fish oil, and all vitamins.   Coronavirus Screening Do you have any of the following symptoms:  Cough yes/no: No Fever (>100.66F)  yes/no: No Runny nose yes/no: No Sore throat yes/no: No Difficulty breathing/shortness of breath  yes/no: No  Have you traveled in the last 14 days and where? yes/no: No  Patient verbalized understanding of instructions that were given via phone.

## 2020-06-21 NOTE — Progress Notes (Signed)
Case: [id number] Date/Time: 06/22/20 1356   Procedure: OPEN REDUCTION INTERNAL FIXATION (ORIF) LISFRANC DISLOCATION LEFT FOOT (Left: Foot)   Anesthesia type: Choice   Pre-op diagnosis: Lisfranc Fracture Dislocation Left Foot   Location: [facility] OR ROOM 05 / [place] OR   Surgeons: [name], MD       DISCUSSION: Patient is a 45 year old female scheduled for the above procedure. She stepped in a hole on 06/10/20 while at [facility] and sustained  Lisfrance injury as well as metatarsal neck fracture 3 and 4. She has had follow-up with Dr. Sharol Given and a left foot CT with the above procedure recommended.    History includes former smoker, myotonic dystrophy  (type 1, diagnosed 2009) with chronic lower back pain, OSA (does not use CPAP, claustrophobic), GERD, endometriosis, migraines, vertigo (history of), anxiety.  Last neurology visit seen is with Dr. Posey Pronto from 04/06/17. She recommended yearly EKGs given myotonic dystrophy history. She had seen cardiologist Jenell Milliner, MD in 2009 following her initial diagnosis and did not have any clinical evidence of myotonic dystrophy cardiac involvement by exam but did not have an echo. By notes, EKG then showed NSR, normal PR, QRS, and QTc. Will order preoperative EKG for the day of surgery.  She is a same day work-up, so anesthesia team evaluation with labs/EKG as indicated on the day of surgery.    VS: Ht 5\' 3"  (1.6 m)   Wt 61.2 kg   BMI 23.91 kg/m  BP Readings from Last 3 Encounters:  06/13/20 110/70  01/10/20 124/80  12/09/17 140/80   Pulse Readings from Last 3 Encounters:  06/13/20 87  01/10/20 81  12/09/17 86     PROVIDERS: Laurey Morale, MD is PCP  Narda Amber, DO is last neurology visit I see from 04/06/17. She noted, "Starting around her late 58s, she began having severe low back pain and was evaluated by various providers and eventually saw Dr. Erling Cruz in 2009, who had very high clinical suspicion for myotonic dystrophy by her facial features  and exam.  Her genetic testing confirmed myotonic dystrophy type 1.  She was briefly followed by Dr. Erling Cruz and later discharged, as she did not comply with recommendations, namely being treated for OSA." She was referred to pulmonologist Kara Mead, MD for sleep evaluation, but by his 04/30/17 note she was not willing to go through another sleep study and due to claustrophobia could not tolerate CPAP, so no new sleep study arranged.    LABS: For day of surgery as indicated.   By 04/30/17 pulmonary notes Elsworth Soho, Davonna Belling, MD): NPSG 07/2007 total sleep time 122 minutes, 53 hypopneas with AHI 26/hour   IMAGES: CT Left Foot 06/18/20: IMPRESSION: 1. Multiple nondisplaced intra-articular fractures around the Lisfranc joint, involving the 2nd and 3rd metatarsal bases, the medial and lateral cuneiforms and the cuboid. 2. Mildly displaced fractures of the 3rd and 4th metatarsal necks. Third metatarsal fracture may involve the articular surface of the metatarsal head dorsally. 3. The additional tarsal bones appear intact.    EKG: For day of surgery.    CV: N/A   Past Medical History:  Diagnosis Date   Anxiety    Arthritis    Cataracts, bilateral    MD just watching   Chronic lower back pain DUE TO MYOTONIC DYSTROPHY   Depression    patient denies this dx   Endometriosis    GERD (gastroesophageal reflux disease)    diet controlled   History of right foot drop  Migraine    Myotonic dystrophy ([place]) [place] 2009  ----- TYPE 1   FOLLOWED BY DR. LOVE   Numbness of fingers BILATERAL HANDS/  OCCASIONLLY RADIATES UP ARMS   Pelvic pain in female    Seasonal allergies    Sleep apnea    does not use cpap   Vertigo    no current problems in the last year   Vitamin D deficiency     Past Surgical History:  Procedure Laterality Date   BENIGN BREAST BX   FEB  2011   BREAST BIOPSY Right 2011   Benign Stereo    LAPAROSCOPY  05/01/2011   Procedure: LAPAROSCOPY DIAGNOSTIC;  Surgeon: Bennetta Laos, MD;  Location: [facility];  Service: Gynecology;  Laterality: N/A;  with bilateral chromopertubation, aspiration of functional left ovarian cyst, excision of two hydatid cysts   MUSCLE BIOPSY  02-25-2007   LEFT THIGH QUADRICEP BX X3  FOR MYOSITIS   SURG. FOR REMOVAL OF TEETH FRAGMENTS    MARCH 2012   ORAL SURGEON OFFICE   WISDOM TOOTH EXTRACTION      MEDICATIONS: No current facility-administered medications for this encounter.    acetaminophen (TYLENOL) 500 MG tablet   cetirizine (ZYRTEC) 10 MG tablet   oxyCODONE-acetaminophen (PERCOCET/ROXICET) 5-325 MG tablet    Myra Gianotti, PA-C Surgical Short Stay/Anesthesiology [facility] Phone [phone] [facility] Phone [phone] 06/21/2020 1:29 PM

## 2020-06-22 ENCOUNTER — Ambulatory Visit (HOSPITAL_COMMUNITY): Payer: 59 | Admitting: Vascular Surgery

## 2020-06-22 ENCOUNTER — Encounter (HOSPITAL_COMMUNITY): Payer: Self-pay | Admitting: Orthopedic Surgery

## 2020-06-22 ENCOUNTER — Other Ambulatory Visit: Payer: Self-pay

## 2020-06-22 ENCOUNTER — Ambulatory Visit (HOSPITAL_COMMUNITY)
Admission: RE | Admit: 2020-06-22 | Discharge: 2020-06-22 | Disposition: A | Discharge status: Home / Self Care | Payer: 59 | Attending: Orthopedic Surgery | Admitting: Orthopedic Surgery

## 2020-06-22 ENCOUNTER — Telehealth: Payer: Self-pay | Admitting: Orthopedic Surgery

## 2020-06-22 ENCOUNTER — Other Ambulatory Visit: Payer: Self-pay | Admitting: Physician Assistant

## 2020-06-22 ENCOUNTER — Encounter (HOSPITAL_COMMUNITY)
Admission: RE | Disposition: A | Discharge status: Home / Self Care | Payer: Self-pay | Source: Home / Self Care | Attending: Orthopedic Surgery

## 2020-06-22 DIAGNOSIS — Z5309 Procedure and treatment not carried out because of other contraindication: Secondary | ICD-10-CM | POA: Diagnosis not present

## 2020-06-22 DIAGNOSIS — Z9102 Food additives allergy status: Secondary | ICD-10-CM | POA: Insufficient documentation

## 2020-06-22 DIAGNOSIS — Z888 Allergy status to other drugs, medicaments and biological substances status: Secondary | ICD-10-CM | POA: Insufficient documentation

## 2020-06-22 DIAGNOSIS — G7111 Myotonic muscular dystrophy: Secondary | ICD-10-CM

## 2020-06-22 DIAGNOSIS — S92302A Fracture of unspecified metatarsal bone(s), left foot, initial encounter for closed fracture: Secondary | ICD-10-CM | POA: Insufficient documentation

## 2020-06-22 DIAGNOSIS — X58XXXA Exposure to other specified factors, initial encounter: Secondary | ICD-10-CM | POA: Insufficient documentation

## 2020-06-22 DIAGNOSIS — Z881 Allergy status to other antibiotic agents status: Secondary | ICD-10-CM | POA: Insufficient documentation

## 2020-06-22 DIAGNOSIS — Z8249 Family history of ischemic heart disease and other diseases of the circulatory system: Secondary | ICD-10-CM | POA: Diagnosis not present

## 2020-06-22 DIAGNOSIS — Z833 Family history of diabetes mellitus: Secondary | ICD-10-CM | POA: Insufficient documentation

## 2020-06-22 DIAGNOSIS — Z886 Allergy status to analgesic agent status: Secondary | ICD-10-CM | POA: Insufficient documentation

## 2020-06-22 DIAGNOSIS — Z87891 Personal history of nicotine dependence: Secondary | ICD-10-CM | POA: Insufficient documentation

## 2020-06-22 DIAGNOSIS — Z91040 Latex allergy status: Secondary | ICD-10-CM | POA: Insufficient documentation

## 2020-06-22 HISTORY — DX: Unspecified cataract: H26.9

## 2020-06-22 HISTORY — DX: Personal history of other diseases of the musculoskeletal system and connective tissue: Z87.39

## 2020-06-22 HISTORY — DX: Endometriosis, unspecified: N80.9

## 2020-06-22 HISTORY — DX: Other seasonal allergic rhinitis: J30.2

## 2020-06-22 HISTORY — DX: Gastro-esophageal reflux disease without esophagitis: K21.9

## 2020-06-22 HISTORY — DX: Dizziness and giddiness: R42

## 2020-06-22 HISTORY — DX: Sleep apnea, unspecified: G47.30

## 2020-06-22 HISTORY — DX: Unspecified osteoarthritis, unspecified site: M19.90

## 2020-06-22 LAB — BASIC METABOLIC PANEL
Anion gap: 11 (ref 5–15)
BUN: 10 mg/dL (ref 6–20)
CO2: 25 mmol/L (ref 22–32)
Calcium: 9.5 mg/dL (ref 8.9–10.3)
Chloride: 102 mmol/L (ref 98–111)
Creatinine, Ser: 0.78 mg/dL (ref 0.44–1.00)
GFR, Estimated: >60 mL/min (ref >60)
Glucose, Bld: 273 mg/dL — ABNORMAL HIGH (ref 70–99)
Potassium: 4 mmol/L (ref 3.5–5.1)
Sodium: 138 mmol/L (ref 135–145)

## 2020-06-22 LAB — CBC
HCT: 48.1 % — ABNORMAL HIGH (ref 36.0–46.0)
Hemoglobin: 15 g/dL (ref 12.0–15.0)
MCH: 30.2 pg (ref 26.0–34.0)
MCHC: 31.2 g/dL (ref 30.0–36.0)
MCV: 97 fL (ref 80.0–100.0)
Platelets: 302 10*3/uL (ref 150–400)
RBC: 4.96 MIL/uL (ref 3.87–5.11)
RDW: 13.7 % (ref 11.5–15.5)
WBC: 6.3 10*3/uL (ref 4.0–10.5)
nRBC: 0 % (ref 0.0–0.2)

## 2020-06-22 LAB — POCT PREGNANCY, URINE: Preg Test, Ur: NEGATIVE

## 2020-06-22 SURGERY — OPEN REDUCTION INTERNAL FIXATION (ORIF) ANKLE FRACTURE
Anesthesia: General

## 2020-06-22 MED ORDER — LIDOCAINE HCL (PF) 2 % IJ SOLN
INTRAMUSCULAR | Status: AC
  Filled 2020-06-22: qty 5

## 2020-06-22 MED ORDER — ORAL CARE MOUTH RINSE: 15.0000 mL | Freq: Once | OROMUCOSAL | Status: AC

## 2020-06-22 MED ORDER — CEFAZOLIN SODIUM-DEXTROSE 2-4 GM/100ML-% IV SOLN
2.0000 g | INTRAVENOUS | Status: DC
  Filled 2020-06-22: qty 100

## 2020-06-22 MED ORDER — ONDANSETRON HCL 4 MG/2ML IJ SOLN
INTRAMUSCULAR | Status: AC
  Filled 2020-06-22: qty 2

## 2020-06-22 MED ORDER — OXYCODONE-ACETAMINOPHEN 5-325 MG PO TABS: 1.0000 | ORAL_TABLET | Freq: Three times a day (TID) | ORAL | 0 refills | Status: DC | PRN

## 2020-06-22 MED ORDER — FENTANYL CITRATE (PF) 250 MCG/5ML IJ SOLN
INTRAMUSCULAR | Status: AC
  Filled 2020-06-22: qty 5

## 2020-06-22 MED ORDER — MIDAZOLAM HCL 2 MG/2ML IJ SOLN
INTRAMUSCULAR | Status: AC
  Filled 2020-06-22: qty 2

## 2020-06-22 MED ORDER — PROPOFOL 10 MG/ML IV BOLUS
INTRAVENOUS | Status: AC
  Filled 2020-06-22: qty 20

## 2020-06-22 MED ORDER — CHLORHEXIDINE GLUCONATE 0.12 % MT SOLN
15.0000 mL | Freq: Once | OROMUCOSAL | Status: AC
  Administered 2020-06-22: 15 mL via OROMUCOSAL
  Filled 2020-06-22: qty 15

## 2020-06-22 MED ORDER — LACTATED RINGERS IV SOLN: INTRAVENOUS | Status: DC

## 2020-06-22 MED ORDER — DEXAMETHASONE SODIUM PHOSPHATE 10 MG/ML IJ SOLN
INTRAMUSCULAR | Status: AC
  Filled 2020-06-22: qty 1

## 2020-06-22 SURGICAL SUPPLY — 36 items
BANDAGE ESMARK 6X9 LF (GAUZE/BANDAGES/DRESSINGS) IMPLANT
BNDG CMPR 9X6 STRL LF SNTH (GAUZE/BANDAGES/DRESSINGS)
BNDG COHESIVE 4X5 TAN STRL (GAUZE/BANDAGES/DRESSINGS) ×4 IMPLANT
BNDG ESMARK 6X9 LF (GAUZE/BANDAGES/DRESSINGS)
BNDG GAUZE ELAST 4 BULKY (GAUZE/BANDAGES/DRESSINGS) ×4 IMPLANT
COVER SURGICAL LIGHT HANDLE (MISCELLANEOUS) ×4 IMPLANT
COVER WAND RF STERILE (DRAPES) ×4 IMPLANT
DRAPE OEC MINIVIEW 54X84 (DRAPES) IMPLANT
DRAPE U-SHAPE 47X51 STRL (DRAPES) ×4 IMPLANT
DRSG ADAPTIC 3X8 NADH LF (GAUZE/BANDAGES/DRESSINGS) ×4 IMPLANT
DRSG PAD ABDOMINAL 8X10 ST (GAUZE/BANDAGES/DRESSINGS) ×4 IMPLANT
DURAPREP 26ML APPLICATOR (WOUND CARE) ×4 IMPLANT
ELECT REM PT RETURN 9FT ADLT (ELECTROSURGICAL) ×3
ELECTRODE REM PT RTRN 9FT ADLT (ELECTROSURGICAL) ×2 IMPLANT
GAUZE SPONGE 4X4 12PLY STRL (GAUZE/BANDAGES/DRESSINGS) ×4 IMPLANT
GLOVE BIOGEL PI IND STRL 9 (GLOVE) ×2 IMPLANT
GLOVE BIOGEL PI INDICATOR 9 (GLOVE) ×2
GLOVE SURG ORTHO 9.0 STRL STRW (GLOVE) ×4 IMPLANT
GOWN STRL REUS W/ TWL XL LVL3 (GOWN DISPOSABLE) ×6 IMPLANT
GOWN STRL REUS W/TWL XL LVL3 (GOWN DISPOSABLE) ×9
KIT BASIN OR (CUSTOM PROCEDURE TRAY) ×4 IMPLANT
KIT TURNOVER KIT B (KITS) ×4 IMPLANT
MANIFOLD NEPTUNE II (INSTRUMENTS) ×4 IMPLANT
NS IRRIG 1000ML POUR BTL (IV SOLUTION) ×4 IMPLANT
PACK ORTHO EXTREMITY (CUSTOM PROCEDURE TRAY) ×4 IMPLANT
PAD ARMBOARD 7.5X6 YLW CONV (MISCELLANEOUS) ×8 IMPLANT
STAPLER VISISTAT 35W (STAPLE) IMPLANT
SUCTION FRAZIER HANDLE 10FR (MISCELLANEOUS) ×2
SUCTION TUBE FRAZIER 10FR DISP (MISCELLANEOUS) ×2 IMPLANT
SUT ETHILON 2 0 PSLX (SUTURE) IMPLANT
SUT VIC AB 2-0 CT1 27 (SUTURE) ×3
SUT VIC AB 2-0 CT1 TAPERPNT 27 (SUTURE) ×2 IMPLANT
TOWEL GREEN STERILE (TOWEL DISPOSABLE) ×4 IMPLANT
TOWEL GREEN STERILE FF (TOWEL DISPOSABLE) ×4 IMPLANT
TUBE CONNECTING 12'X1/4 (SUCTIONS) ×1
TUBE CONNECTING 12X1/4 (SUCTIONS) ×3 IMPLANT

## 2020-06-22 NOTE — H&P (Signed)
Valerie Friedman is an 45 y.o. female.   Chief Complaint: Left foot pain HPI: Patient is a 45 year old woman who presents in follow-up for CT scan left foot status post Lisfranc fracture dislocation as well as metatarsal neck fractures.  Patient states she has been keeping her foot elevated 21 hours a day she has been using the fracture boot.    Past Medical History:  Diagnosis Date   Anxiety    Arthritis    Cataracts, bilateral    MD just watching   Chronic lower back pain DUE TO MYOTONIC DYSTROPHY   Depression    patient denies this dx   Endometriosis    GERD (gastroesophageal reflux disease)    diet controlled   History of right foot drop    Migraine    Myotonic dystrophy ([place]) DX JULY 2009  ----- TYPE 1   FOLLOWED BY DR. LOVE   Numbness of fingers BILATERAL HANDS/  OCCASIONLLY RADIATES UP ARMS   Pelvic pain in female    Seasonal allergies    Sleep apnea    does not use cpap   Vertigo    no current problems in the last year   Vitamin D deficiency     Past Surgical History:  Procedure Laterality Date   BENIGN BREAST BX   FEB  2011   BREAST BIOPSY Right 2011   Benign Stereo    LAPAROSCOPY  05/01/2011   Procedure: LAPAROSCOPY DIAGNOSTIC;  Surgeon: Bennetta Laos, MD;  Location: [place];  Service: Gynecology;  Laterality: N/A;  with bilateral chromopertubation, aspiration of functional left ovarian cyst, excision of two hydatid cysts   MUSCLE BIOPSY  02-25-2007   LEFT THIGH QUADRICEP BX X3  FOR MYOSITIS   SURG. FOR REMOVAL OF TEETH FRAGMENTS    MARCH 2012   ORAL SURGEON OFFICE   WISDOM TOOTH EXTRACTION      Family History  Problem Relation Age of Onset   Diabetes Maternal Grandfather    Heart disease Maternal Grandfather    Diabetes Mother    Hypertension Father    Hyperlipidemia Father    Heart disease Father    Cancer Paternal Uncle        Colon cancer   Parkinsonism Paternal Uncle    Heart disease Maternal Grandmother    Heart disease  Paternal Grandmother    Stroke Paternal Grandmother    CAD Paternal Grandmother    Heart disease Paternal Grandfather    Diabetes Brother    Atrial fibrillation Brother    Social History:  reports that she quit smoking about 3 years ago. Her smoking use included cigarettes. She has a 2.50 pack-year smoking history. She has never used smokeless tobacco. She reports previous alcohol use. She reports that she does not use drugs.  Allergies:  Allergies  Allergen Reactions   Latex Hives   Aleve [Naproxen Sodium] Other (See Comments)    "GETS THE CHILLS"   Ciprocin-Fluocin-Procin [Fluocinolone Acetonide] Other (See Comments)    GI UPSET AND HEADACHE   Macrobid [Nitrofurantoin Monohydrate Macrocrystals] Other (See Comments)    GI UPSET; headache   Tramadol Hcl     Pt had a Hx of dependency on this medication    Yellow Dyes (Non-Tartrazine) Nausea And Vomiting    YELLOW DYE #5     Other Rash    [place] JELLY    No medications prior to admission.    No results found for this or any previous visit (from the past 48  hour(s)). No results found.  Review of Systems  All other systems reviewed and are negative.  Height 5\' 3"  (1.6 m), weight 61.2 kg. Physical Exam  Patient is alert, oriented, no adenopathy, well-dressed, normal affect, normal respiratory effort. Examination the swelling and bruising is resolving in the left foot.  Patient has a good pulse.  Review of the CT scan shows intra-articular fractures across the Lisfranc joint involving the second and third metatarsal bases as well as involving the medial and lateral cuneiforms.  There is also displaced fractures across the metatarsal necks 3 and 4.Heart RRR Lungs Clear Assessment/Plan    Plan: We reviewed her CT scan and recommended proceeding with internal fixation across the Lisfranc complex left foot.  Risks and benefits were discussed including infection persistent pain nonhealing of the wound and need for additional surgery.   Patient states she understands wished to proceed at this time.  She would like to proceed with outpatient surgery on Friday. Bevely Palmer Elena Davia, PA 06/22/2020, 6:43 AM

## 2020-06-22 NOTE — Telephone Encounter (Signed)
Patient called. She would like percocet called in for her pain. Says the MD that she uses for the refills is on vacation until next week. She says she can not go all weekend without pain medication. Her call back number is [phone]

## 2020-06-22 NOTE — Progress Notes (Signed)
Procedure cancelled per dr. Nyoka Cowden and dr. Sharol Given. Patient needs to follow up with cardiology before proceeding with surgery.

## 2020-06-22 NOTE — Anesthesia Preprocedure Evaluation (Addendum)
Anesthesia Evaluation  Patient identified by MRN, date of birth, ID band Patient awake    Reviewed: Allergy & Precautions, NPO status , Patient's Chart, lab work & pertinent test results  Airway Mallampati: II  TM Distance: >3 FB     Dental   Pulmonary sleep apnea , former smoker,    breath sounds clear to auscultation       Cardiovascular negative cardio ROS   Rhythm:Regular Rate:Normal     Neuro/Psych  Headaches, PSYCHIATRIC DISORDERS Anxiety Depression  Neuromuscular disease    GI/Hepatic Neg liver ROS, GERD  ,  Endo/Other  negative endocrine ROS  Renal/GU negative Renal ROS     Musculoskeletal   Abdominal   Peds  Hematology   Anesthesia Other Findings   Reproductive/Obstetrics                             Anesthesia Physical Anesthesia Plan  ASA: 3  Anesthesia Plan: General   Post-op Pain Management:    Induction: Intravenous  PONV Risk Score and Plan: Ondansetron, Dexamethasone and Midazolam  Airway Management Planned: Oral ETT  Additional Equipment:   Intra-op Plan:   Post-operative Plan: Extubation in OR  Informed Consent: I have reviewed the patients History and Physical, chart, labs and discussed the procedure including the risks, benefits and alternatives for the proposed anesthesia with the patient or authorized representative who has indicated his/her understanding and acceptance.     Dental advisory given  Plan Discussed with: CRNA and Anesthesiologist  Anesthesia Plan Comments:         Anesthesia Quick Evaluation

## 2020-06-22 NOTE — Telephone Encounter (Signed)
Ok per PA for refill of pain medication but lm on vm to advise pt to use sparingly as this will not work for her if used too much prior to surgery. Also advised that cardiology will be calling to schedule appt.

## 2020-06-26 ENCOUNTER — Telehealth: Payer: Self-pay | Admitting: Orthopedic Surgery

## 2020-06-26 NOTE — Telephone Encounter (Signed)
Please see message below. Can we change the referral to the cardiologist from Dr. Burt Knack to Dr. Crissie Sickles per pt request? The pt needs cards clearance prior to her surgery. Let me know if there is anything that I can do to help.

## 2020-06-26 NOTE — Telephone Encounter (Signed)
Patient called advised Dr Antionette Char office will not see her because it has been over 3 years since she has seen him. Patient was told  Dr. Burt Knack is no longer excepting new patients. Patient asked if she be referred to  Dr. Champ Mungo. [name] on [facility] [id number]. The number to contact Dr. Tanna Furry office is [phone]    The number to contact patient is [phone]  [facility]) or [phone] (Cell) Father's number   Pt advised if she can not answer phone please give all information to her father. Patient's father name is  Valerie Friedman

## 2020-06-27 ENCOUNTER — Other Ambulatory Visit: Payer: Self-pay

## 2020-06-27 ENCOUNTER — Encounter: Payer: Self-pay | Admitting: Cardiology

## 2020-06-27 ENCOUNTER — Ambulatory Visit (INDEPENDENT_AMBULATORY_CARE_PROVIDER_SITE_OTHER): Payer: 59 | Admitting: Cardiology

## 2020-06-27 VITALS — BP 138/86 | HR 90 | Ht 62.5 in | Wt 137.0 lb

## 2020-06-27 DIAGNOSIS — G7111 Myotonic muscular dystrophy: Secondary | ICD-10-CM

## 2020-06-27 DIAGNOSIS — Z01818 Encounter for other preprocedural examination: Secondary | ICD-10-CM

## 2020-06-27 MED ORDER — METOPROLOL TARTRATE 100 MG PO TABS: 100.0000 mg | ORAL_TABLET | Freq: Once | ORAL | 0 refills | Status: DC

## 2020-06-27 NOTE — Patient Instructions (Addendum)
Medication Instructions:  Your physician recommends that you continue on your current medications as directed. Please refer to the Current Medication list given to you today.  *If you need a refill on your cardiac medications before your next appointment, please call your pharmacy*   Testing/Procedures: Your physician has requested that you have an echocardiogram. Echocardiography is a painless test that uses sound waves to create images of your heart. It provides your doctor with information about the size and shape of your heart and how well your heart's chambers and valves are working. This procedure takes approximately one hour. There are no restrictions for this procedure.  Your physician has requested that you have a coronary CTA scan. Please see below for further instructions.    Follow-Up: At [facility], you and your health needs are our priority.  As part of our continuing mission to provide you with exceptional heart care, we have created designated Provider Care Teams.  These Care Teams include your primary Cardiologist (physician) and Advanced Practice Providers (APPs -  Physician Assistants and Nurse Practitioners) who all work together to provide you with the care you need, when you need it.  Follow up with Dr. Radford Pax as needed based on results of testing.    Other Instructions Your cardiac CT will be scheduled at:   [facility] [address] [place], [place] [address] [phone]  Please arrive at the [facility] main entrance (entrance A) of [facility] 30 minutes prior to test start time. Proceed to the [facility] Radiology Department (first floor) to check-in and test prep.   Please follow these instructions carefully (unless otherwise directed):  On the Night Before the Test: Be sure to Drink plenty of water. Do not consume any caffeinated/decaffeinated beverages or chocolate 12 hours prior to your test. Do not take any antihistamines  12 hours prior to your test.   On the Day of the Test: Drink plenty of water until 1 hour prior to the test. Do not eat any food 4 hours prior to the test. You may take your regular medications prior to the test.  Take metoprolol (Lopressor) two hours prior to test. FEMALES- please wear underwire-free bra if available       After the Test: Drink plenty of water. After receiving IV contrast, you may experience a mild flushed feeling. This is normal. On occasion, you may experience a mild rash up to 24 hours after the test. This is not dangerous. If this occurs, you can take Benadryl 25 mg and increase your fluid intake. If you experience trouble breathing, this can be serious. If it is severe call 911 IMMEDIATELY. If it is mild, please call our office.   Once we have confirmed authorization from your insurance company, we will call you to set up a date and time for your test. Based on how quickly your insurance processes prior authorizations requests, please allow up to 4 weeks to be contacted for scheduling your Cardiac CT appointment. Be advised that routine Cardiac CT appointments could be scheduled as many as 8 weeks after your provider has ordered it.  For non-scheduling related questions, please contact the cardiac imaging nurse navigator should you have any questions/concerns: Marchia Bond, Cardiac Imaging Nurse Navigator Gordy Clement, Cardiac Imaging Nurse Navigator [facility] Direct Office Dial: [phone]   For scheduling needs, including cancellations and rescheduling, please call [place], [phone].

## 2020-06-27 NOTE — Addendum Note (Signed)
Addended by: Antonieta Iba on: 06/27/2020 09:51 AM   Modules accepted: Orders

## 2020-06-27 NOTE — Telephone Encounter (Signed)
This was completed on 06/27/20 with Dr. Radford Pax.

## 2020-06-27 NOTE — Progress Notes (Signed)
Cardiology CONSULT Note    Date:  06/27/2020   ID:  Valerie Friedman, DOB 06/04/1975, MRN [id number]  PCP:  Laurey Morale, MD  Cardiologist:  Fransico Him, MD   Chief Complaint  Patient presents with   New Patient (Initial Visit)    Myotonic dystrophy and preop cardiac clearance     History of Present Illness:  Valerie Friedman is a 45 y.o. female who is being seen today for the evaluation of myotonic dystrophy and need for preoperative clearance at the request of [name], MD.  This is a 45yo female with a hx of myotonic dystrophy type 1, GERD ad OSA not on CPAP(apparently was not very bad) who is referred for cardiac evaluation prior to undergoing surgery with foot reconstruction.  Apparently she had problems with sedation during oral surgery and her HR went high and her O2 sats dropped a while back.  She has never had a cardiac workup in the past and was dx with Myotonic dystrophy type 1 in 2009.   She says that she got it from her mother who passed away from surgical complications but not from complications from her myotonic dystrophy.     Unfortunately she fell memorial day weekend and broke her foot and now needs surgery.  Apparently she had an EKG for preop eval and was told it was abnormal. She says that she occasionally gets sharp shooting pains down her chest that are sporadic and nonexertional.  She stopped smoking 4 years ago but still gets it and she thinks it is stress related.  She has a lot of stress since her mom passed away 6 months ago and her brother almost died recently.  She has chronic SOB that is sporadic and can occur even when she lays in bed at night.  She occasionally as some racing of  her heart beat.  She denies any PND, orthopnea, LE edema, dizziness (except for vertigo), or syncope.   Past Medical History:  Diagnosis Date   Anxiety    Arthritis    Cataracts, bilateral    MD just watching   Chronic lower back pain DUE TO MYOTONIC DYSTROPHY    Depression    patient denies this dx   Endometriosis    GERD (gastroesophageal reflux disease)    diet controlled   History of right foot drop    Migraine    Myotonic dystrophy ([place]) DX JULY 2009  ----- TYPE 1   FOLLOWED BY DR. LOVE   Numbness of fingers BILATERAL HANDS/  OCCASIONLLY RADIATES UP ARMS   Pelvic pain in female    Seasonal allergies    Sleep apnea    does not use cpap   Vertigo    no current problems in the last year   Vitamin D deficiency     Past Surgical History:  Procedure Laterality Date   BENIGN BREAST BX   FEB  2011   BREAST BIOPSY Right 2011   Benign Stereo    LAPAROSCOPY  05/01/2011   Procedure: LAPAROSCOPY DIAGNOSTIC;  Surgeon: Bennetta Laos, MD;  Location: [facility];  Service: Gynecology;  Laterality: N/A;  with bilateral chromopertubation, aspiration of functional left ovarian cyst, excision of two hydatid cysts   MUSCLE BIOPSY  02-25-2007   LEFT THIGH QUADRICEP BX X3  FOR MYOSITIS   SURG. FOR REMOVAL OF TEETH FRAGMENTS    MARCH 2012   ORAL SURGEON OFFICE   WISDOM TOOTH EXTRACTION  Current Medications: Current Meds  Medication Sig   cetirizine (ZYRTEC) 10 MG tablet Take 10 mg by mouth daily as needed for allergies.   oxyCODONE-acetaminophen (PERCOCET/ROXICET) 5-325 MG tablet Take 1 tablet by mouth every 8 (eight) hours as needed for severe pain or moderate pain.   [DISCONTINUED] isometheptene-acetaminophen-dichloralphenazone (MIDRIN) 65-325-100 MG per capsule Take 1 capsule by mouth 4 (four) times daily as needed. Prn     Allergies:   Latex, Aleve [naproxen sodium], Ciprocin-fluocin-procin [fluocinolone acetonide], Macrobid [nitrofurantoin monohydrate macrocrystals], Tramadol hcl, Yellow dyes (non-tartrazine), and Other   Social History   Socioeconomic History   Marital status: Single    Spouse name: Not on file   Number of children: Not on file   Years of education: Not on file   Highest education level: Not on file   Occupational History   Not on file  Tobacco Use   Smoking status: Former    Packs/day: 0.25    Years: 10.00    Pack years: 2.50    Types: Cigarettes    Quit date: 06/28/2016    Years since quitting: 4.0   Smokeless tobacco: Never   Tobacco comments:    varies, 0-0.5 pack per day  Vaping Use   Vaping Use: Never used  Substance and Sexual Activity   Alcohol use: Not Currently    Alcohol/week: 0.0 standard drinks    Comment: rare   Drug use: No   Sexual activity: Not Currently    Birth control/protection: None  Other Topics Concern   Not on file  Social History Narrative   Pt lives in 1 story home with her parents and brother   Some college   On disability   Social Determinants of Health   Financial Resource Strain: Not on file  Food Insecurity: Not on file  Transportation Needs: Not on file  Physical Activity: Not on file  Stress: Not on file  Social Connections: Not on file     Family History:  The patient's family history includes Atrial fibrillation in her brother; CAD in her paternal grandmother; Cancer in her paternal uncle; Diabetes in her brother, maternal grandfather, and mother; Heart disease in her father, maternal grandfather, maternal grandmother, paternal grandfather, and paternal grandmother; Hyperlipidemia in her father; Hypertension in her father; Parkinsonism in her paternal uncle; Stroke in her paternal grandmother.   ROS:   Please see the history of present illness.    ROS All other systems reviewed and are negative.  No flowsheet data found.     PHYSICAL EXAM:   VS:  BP 138/86   Pulse 90   Ht 5' 2.5" (1.588 m)   Wt 137 lb (62.1 kg)   SpO2 96%   BMI 24.66 kg/m    GEN: Well nourished, well developed, in no acute distress  HEENT: normal  Neck: no JVD, carotid bruits, or masses Cardiac: RRR; no murmurs, rubs, or gallops,no edema.  Intact distal pulses bilaterally.  Respiratory:  clear to auscultation bilaterally, normal work of  breathing GI: soft, nontender, nondistended, + BS MS: no deformity or atrophy  Skin: warm and dry, no rash Neuro:  Alert and Oriented x 3, Strength and sensation are intact Psych: euthymic mood, full affect  Wt Readings from Last 3 Encounters:  06/27/20 137 lb (62.1 kg)  06/22/20 137 lb (62.1 kg)  06/13/20 134 lb (60.8 kg)      Studies/Labs Reviewed:   EKG:  EKG is not ordered today.    Recent Labs: 06/22/2020: BUN 10; Creatinine,  Ser 0.78; Hemoglobin 15.0; Platelets 302; Potassium 4.0; Sodium 138   Lipid Panel    Component Value Date/Time   CHOL 181 06/18/2016 1455   TRIG 130 06/18/2016 1455   HDL 45 (L) 06/18/2016 1455   CHOLHDL 4.0 06/18/2016 1455   VLDL 26 06/18/2016 1455   LDLCALC 110 (H) 06/18/2016 1455    Additional studies/ records that were reviewed today include:  none    ASSESSMENT:    1. Myotonic dystrophy ([place])   2. Preoperative clearance      PLAN:  In order of problems listed above:  Myotonic dystrophy -it is mild and has not affected her mobility -she has chronic back pain from this but non limiting  2.  Preoperative cardiac clearance/Chest pain -she is having some chest pain at times but it is very atypical and likely related to stress with the passing of her mom and her bother almost dying recently -her CP is very sharp and shooting and EKG was abnormal showing an rSR' and LVH by voltage -she has a remote hx of tobacco use and also has a fm hx of CAD -prior to breaking her foot she was able to achieve 5 mets per the Duke Activity Status Index and therefore does not required preop ischemic workup -recommend 2D echo prior to surgery to make sure LVF is ok -given her cardiac risk factors and her atypical chest pain, I have recommended getting a coronary CTA prior to undergoing surgery  Time Spent: 25 minutes total time of encounter, including 15 minutes spent in face-to-face patient care on the date of this encounter. This time includes  coordination of care and counseling regarding above mentioned problem list. Remainder of non-face-to-face time involved reviewing chart documents/testing relevant to the patient encounter and documentation in the medical record. I have independently reviewed documentation from referring provider  Medication Adjustments/Labs and Tests Ordered: Current medicines are reviewed at length with the patient today.  Concerns regarding medicines are outlined above.  Medication changes, Labs and Tests ordered today are listed in the Patient Instructions below.  There are no Patient Instructions on file for this visit.   Signed, Fransico Him, MD  06/27/2020 9:34 AM    [facility] [address], [place], [place]  [address] Phone: [phone]; Fax: [phone]

## 2020-07-02 ENCOUNTER — Ambulatory Visit (HOSPITAL_COMMUNITY): Payer: 59 | Attending: Cardiology

## 2020-07-02 ENCOUNTER — Other Ambulatory Visit: Payer: Self-pay

## 2020-07-02 DIAGNOSIS — G7111 Myotonic muscular dystrophy: Secondary | ICD-10-CM | POA: Diagnosis not present

## 2020-07-02 DIAGNOSIS — Z01810 Encounter for preprocedural cardiovascular examination: Secondary | ICD-10-CM

## 2020-07-02 DIAGNOSIS — G4730 Sleep apnea, unspecified: Secondary | ICD-10-CM | POA: Insufficient documentation

## 2020-07-02 DIAGNOSIS — Z01818 Encounter for other preprocedural examination: Secondary | ICD-10-CM

## 2020-07-02 LAB — ECHOCARDIOGRAM COMPLETE
Area-P 1/2: 3.6 cm2
S' Lateral: 2.3 cm

## 2020-07-04 ENCOUNTER — Telehealth: Payer: Self-pay | Admitting: Orthopedic Surgery

## 2020-07-04 NOTE — Telephone Encounter (Signed)
Patient called. She would like a refill on percocet. Her call back number is [phone]

## 2020-07-05 ENCOUNTER — Other Ambulatory Visit: Payer: Self-pay | Admitting: Physician Assistant

## 2020-07-05 ENCOUNTER — Telehealth (HOSPITAL_COMMUNITY): Payer: Self-pay | Admitting: *Deleted

## 2020-07-05 MED ORDER — OXYCODONE-ACETAMINOPHEN 5-325 MG PO TABS: 1.0000 | ORAL_TABLET | Freq: Three times a day (TID) | ORAL | 0 refills | Status: DC | PRN

## 2020-07-05 NOTE — Telephone Encounter (Signed)
Done but remind her to use sparingly

## 2020-07-05 NOTE — Telephone Encounter (Signed)
Reaching out to patient to offer assistance regarding upcoming cardiac imaging study; pt verbalizes understanding of appt date/time, parking situation and where to check in, pre-test NPO status and medications ordered; name and call back number provided for further questions should they arise  Gordy Clement RN Navigator Cardiac Imaging [facility] [phone] office [phone] cell  Pt to take 100mg  metoprolol tartrate 2 hours prior to cardiac CT scan.

## 2020-07-05 NOTE — Telephone Encounter (Signed)
Called and advised of message below. Also advised that the fx boot pt received in office needed signature if pt was not able to come in ok for me to sign advised this was alright and will call with nay other questions.

## 2020-07-05 NOTE — Telephone Encounter (Signed)
Pt is pending cards clearance prior to lisfranc surgery. Requesting refill on pain medication.

## 2020-07-09 ENCOUNTER — Other Ambulatory Visit: Payer: Self-pay

## 2020-07-09 ENCOUNTER — Ambulatory Visit (HOSPITAL_COMMUNITY)
Admission: RE | Admit: 2020-07-09 | Discharge: 2020-07-09 | Disposition: A | Discharge status: Home / Self Care | Payer: 59 | Source: Ambulatory Visit | Attending: Cardiology | Admitting: Cardiology

## 2020-07-09 ENCOUNTER — Telehealth: Payer: Self-pay

## 2020-07-09 DIAGNOSIS — G7111 Myotonic muscular dystrophy: Secondary | ICD-10-CM | POA: Diagnosis not present

## 2020-07-09 DIAGNOSIS — R072 Precordial pain: Secondary | ICD-10-CM | POA: Diagnosis not present

## 2020-07-09 DIAGNOSIS — Z01818 Encounter for other preprocedural examination: Secondary | ICD-10-CM | POA: Diagnosis not present

## 2020-07-09 DIAGNOSIS — R0602 Shortness of breath: Secondary | ICD-10-CM | POA: Diagnosis not present

## 2020-07-09 DIAGNOSIS — Z01810 Encounter for preprocedural cardiovascular examination: Secondary | ICD-10-CM | POA: Diagnosis not present

## 2020-07-09 MED ORDER — NITROGLYCERIN 0.4 MG SL SUBL
0.8000 mg | SUBLINGUAL_TABLET | Freq: Once | SUBLINGUAL | Status: AC
  Administered 2020-07-09: 0.8 mg via SUBLINGUAL

## 2020-07-09 MED ORDER — IOHEXOL 350 MG/ML SOLN
95.0000 mL | Freq: Once | INTRAVENOUS | Status: AC | PRN
  Administered 2020-07-09: 95 mL via INTRAVENOUS

## 2020-07-09 MED ORDER — NITROGLYCERIN 0.4 MG SL SUBL
SUBLINGUAL_TABLET | SUBLINGUAL | Status: AC
  Filled 2020-07-09: qty 2

## 2020-07-09 NOTE — Telephone Encounter (Signed)
-----   Message from Sueanne Margarita, MD sent at 07/09/2020  3:13 PM EDT ----- No CAD on coronary CTA and no coronary Ca. ? Small VSD - please order a limited 2D echo with contrast to rule out VSD.  Non cardiac portion showed moderate fatty liver and thickened esophagus - please forward to PCP for review of noncardiac findings.

## 2020-07-09 NOTE — Telephone Encounter (Signed)
The patient has been notified of the result and verbalized understanding.  All questions (if any) were answered. Antonieta Iba, RN 07/09/2020 5:04 PM   Patient would like to know if she is cleared to have surgery.

## 2020-07-10 NOTE — Telephone Encounter (Signed)
Spoke with the patient and advised her that she needs to have bubble study done prior to being cleared for surgery. Patient verbalized understanding.

## 2020-07-11 ENCOUNTER — Other Ambulatory Visit: Payer: Self-pay

## 2020-07-11 ENCOUNTER — Ambulatory Visit (HOSPITAL_COMMUNITY): Payer: 59 | Attending: Cardiology

## 2020-07-11 DIAGNOSIS — Z87891 Personal history of nicotine dependence: Secondary | ICD-10-CM | POA: Insufficient documentation

## 2020-07-11 DIAGNOSIS — G4733 Obstructive sleep apnea (adult) (pediatric): Secondary | ICD-10-CM | POA: Insufficient documentation

## 2020-07-11 DIAGNOSIS — G7111 Myotonic muscular dystrophy: Secondary | ICD-10-CM | POA: Diagnosis not present

## 2020-07-11 DIAGNOSIS — F419 Anxiety disorder, unspecified: Secondary | ICD-10-CM | POA: Insufficient documentation

## 2020-07-11 DIAGNOSIS — Q210 Ventricular septal defect: Secondary | ICD-10-CM | POA: Diagnosis not present

## 2020-07-11 LAB — ECHOCARDIOGRAM LIMITED BUBBLE STUDY
Area-P 1/2: 3.53 cm2
S' Lateral: 2.4 cm

## 2020-07-20 ENCOUNTER — Telehealth: Payer: Self-pay | Admitting: Orthopedic Surgery

## 2020-07-20 NOTE — Telephone Encounter (Signed)
Pt would like a pain medication refill

## 2020-07-23 ENCOUNTER — Other Ambulatory Visit: Payer: Self-pay | Admitting: Physician Assistant

## 2020-07-23 MED ORDER — OXYCODONE-ACETAMINOPHEN 5-325 MG PO TABS: 1.0000 | ORAL_TABLET | Freq: Three times a day (TID) | ORAL | 0 refills | Status: DC | PRN

## 2020-07-23 NOTE — Telephone Encounter (Signed)
I called patient to advise. Phone stops ringing, no voicemail picks up, and then phone call disconnects. Tried x 3.

## 2020-07-23 NOTE — Telephone Encounter (Signed)
Please advise 

## 2020-07-23 NOTE — Telephone Encounter (Signed)
Done

## 2020-08-02 ENCOUNTER — Ambulatory Visit (INDEPENDENT_AMBULATORY_CARE_PROVIDER_SITE_OTHER): Payer: 59 | Admitting: Physician Assistant

## 2020-08-02 ENCOUNTER — Other Ambulatory Visit: Payer: Self-pay

## 2020-08-02 ENCOUNTER — Ambulatory Visit (INDEPENDENT_AMBULATORY_CARE_PROVIDER_SITE_OTHER): Payer: 59

## 2020-08-02 ENCOUNTER — Encounter: Payer: Self-pay | Admitting: Orthopedic Surgery

## 2020-08-02 DIAGNOSIS — M79672 Pain in left foot: Secondary | ICD-10-CM | POA: Diagnosis not present

## 2020-08-02 NOTE — Progress Notes (Signed)
Office Visit Note   Patient: Valerie Friedman           Date of Birth: 13-Dec-1975           MRN: [id number] Visit Date: 08/02/2020              Requested by: Laurey Morale, MD [place],  [place] [address] PCP: Laurey Morale, MD  Chief Complaint  Patient presents with   Left Foot - Follow-up    Lisfranc fx dislocation surgery cx due to need s for cards clearance.       HPI: Patient presents today for follow-up on her left foot.  She has a history of a left Lisfranc injury.  This was to be fixed approximately 5 weeks ago.  Unfortunately it was required that she needed further cardiac clearance.  She continues to have pain in the midfoot area into the toes.Heart RRR Lungs clear  Assessment & Plan: Visit Diagnoses:  1. Pain in left foot     Plan: We will schedule surgery the patient would like to have surgery soon as possible Follow-Up Instructions: No follow-ups on file.   Ortho Exam  Patient is alert, oriented, no adenopathy, well-dressed, normal affect, normal respiratory effort. Examination of her foot she has palpable dorsalis pedis pulse.  No significant soft tissue swelling no cellulitis skin is in good condition.  She is exquisitely tender through the Lisfranc complex  Imaging: XR Foot Complete Left  Result Date: 08/02/2020 X-rays of her foot today demonstrate widening at the Lisfranc joint.  No other acute changes x-ray is similar to if not the same as x-rays previously taken  No images are attached to the encounter.  Labs: Lab Results  Component Value Date   HGBA1C 5.8 (H) 01/01/2017   HGBA1C 5.9 04/17/2006   ESRSEDRATE 40 (H) 06/22/2017   ESRSEDRATE 21 (H) 03/26/2016   CRP 0.5 06/22/2017   CRP 0.2 (L) 03/26/2016     Lab Results  Component Value Date   ALBUMIN 4.1 06/15/2017   ALBUMIN 4.2 06/18/2016   ALBUMIN 4.3 03/26/2016    Lab Results  Component Value Date   MG 2.3 06/22/2017   Lab Results  Component Value Date    VD25OH 23 (L) 04/21/2016   VD25OH 17.58 (L) 03/26/2016    No results found for: PREALBUMIN CBC EXTENDED Latest Ref Rng & Units 06/22/2020 06/15/2017 03/26/2016  WBC 4.0 - 10.5 K/uL 6.3 7.8 7.0  RBC 3.87 - 5.11 MIL/uL 4.96 4.32 4.91  HGB 12.0 - 15.0 g/dL 15.0 12.8 14.7  HCT 36.0 - 46.0 % 48.1(H) 39.3 44.8  PLT 150 - 400 K/uL 302 231.0 259.0  NEUTROABS 1.4 - 7.7 K/uL - 5.5 5.2  LYMPHSABS 0.7 - 4.0 K/uL - 1.7 1.3     There is no height or weight on file to calculate BMI.  Orders:  Orders Placed This Encounter  Procedures   XR Foot Complete Left   No orders of the defined types were placed in this encounter.    Procedures: No procedures performed  Clinical Data: No additional findings.  ROS:  All other systems negative, except as noted in the HPI. Review of Systems  Objective: Vital Signs: There were no vitals taken for this visit.  Specialty Comments:  No specialty comments available.  PMFS History: Patient Active Problem List   Diagnosis Date Noted   Vertigo 10/28/2017   GERD (gastroesophageal reflux disease) 06/15/2017   Circadian rhythm sleep disorder 04/30/2017  OSA (obstructive sleep apnea) 04/30/2017   Vitamin D deficiency 04/16/2016   Foot drop, right 03/26/2016   Anxiety    Myotonic dystrophy ([place])    Fibromyalgia    Migraine    WEIGHT LOSS 07/11/2008   HEADACHE 07/11/2008   NICOTINE ADDICTION 10/20/2007   MYOTONIC MUSCULAR DYSTROPHY 09/24/2007   MYALGIA/MYOSITIS NOS 08/03/2006   Depression 06/23/2006   Past Medical History:  Diagnosis Date   Anxiety    Arthritis    Cataracts, bilateral    MD just watching   Chronic lower back pain DUE TO MYOTONIC DYSTROPHY   Depression    patient denies this dx   Endometriosis    GERD (gastroesophageal reflux disease)    diet controlled   History of right foot drop    Migraine    Myotonic dystrophy ([place]) DX JULY 2009  ----- TYPE 1   FOLLOWED BY DR. LOVE   Numbness of fingers BILATERAL HANDS/   OCCASIONLLY RADIATES UP ARMS   Pelvic pain in female    Seasonal allergies    Sleep apnea    does not use cpap   Vertigo    no current problems in the last year   Vitamin D deficiency     Family History  Problem Relation Age of Onset   Diabetes Mother    Hypertension Father    Hyperlipidemia Father    Heart disease Father    Hypertension Brother    Diabetes Brother    Atrial fibrillation Brother    Heart disease Maternal Grandmother    Diabetes Maternal Grandfather    Heart disease Maternal Grandfather    Coronary artery disease Maternal Grandfather    Heart disease Paternal Grandmother    Stroke Paternal Grandmother    CAD Paternal Grandmother    Heart disease Paternal Grandfather    Cancer Paternal Uncle        Colon cancer   Parkinsonism Paternal Uncle    Coronary artery disease Maternal Uncle     Past Surgical History:  Procedure Laterality Date   BENIGN BREAST BX   FEB  2011   BREAST BIOPSY Right 2011   Benign Stereo    LAPAROSCOPY  05/01/2011   Procedure: LAPAROSCOPY DIAGNOSTIC;  Surgeon: Bennetta Laos, MD;  Location: [facility];  Service: Gynecology;  Laterality: N/A;  with bilateral chromopertubation, aspiration of functional left ovarian cyst, excision of two hydatid cysts   MUSCLE BIOPSY  02-25-2007   LEFT THIGH QUADRICEP BX X3  FOR MYOSITIS   SURG. FOR REMOVAL OF TEETH FRAGMENTS    MARCH 2012   ORAL SURGEON OFFICE   WISDOM TOOTH EXTRACTION     Social History   Occupational History   Not on file  Tobacco Use   Smoking status: Former    Packs/day: 0.25    Years: 10.00    Pack years: 2.50    Types: Cigarettes    Quit date: 06/28/2016    Years since quitting: 4.0   Smokeless tobacco: Never   Tobacco comments:    varies, 0-0.5 pack per day  Vaping Use   Vaping Use: Never used  Substance and Sexual Activity   Alcohol use: Not Currently    Alcohol/week: 0.0 standard drinks    Comment: rare   Drug use: No   Sexual activity: Not  Currently    Birth control/protection: None

## 2020-08-15 ENCOUNTER — Other Ambulatory Visit: Payer: Self-pay | Admitting: Physician Assistant

## 2020-08-15 ENCOUNTER — Telehealth: Payer: Self-pay | Admitting: Orthopedic Surgery

## 2020-08-15 MED ORDER — OXYCODONE-ACETAMINOPHEN 5-325 MG PO TABS: 1.0000 | ORAL_TABLET | Freq: Three times a day (TID) | ORAL | 0 refills | Status: DC | PRN

## 2020-08-15 NOTE — Telephone Encounter (Signed)
Called pt to advise that rx has been sent to pharm. Lm on vm to call with any questions.

## 2020-08-15 NOTE — Telephone Encounter (Signed)
Pt calling asking for a refill on her medication of oxycodone. The best pharmacy is the one on file and pt asked for a call back once its been sent to them. The best call back number is [phone].

## 2020-08-15 NOTE — Telephone Encounter (Signed)
Pt called and updated her phone number to be contacted by. She is on the list for Sharol Given to get sch and wanted to make sure it was updated and stated  vm's can be left. The best number is [phone].

## 2020-08-15 NOTE — Telephone Encounter (Signed)
Done

## 2020-08-15 NOTE — Telephone Encounter (Signed)
Pt had last refill 07/23/20 #30 please see below and advise.

## 2020-08-22 ENCOUNTER — Other Ambulatory Visit: Payer: Self-pay

## 2020-09-04 ENCOUNTER — Encounter (HOSPITAL_COMMUNITY): Payer: Self-pay | Admitting: Orthopedic Surgery

## 2020-09-04 ENCOUNTER — Other Ambulatory Visit: Payer: Self-pay | Admitting: Physician Assistant

## 2020-09-04 ENCOUNTER — Other Ambulatory Visit: Payer: Self-pay

## 2020-09-04 MED ORDER — ALPRAZOLAM 0.5 MG PO TABS: 0.5000 mg | ORAL_TABLET | Freq: Every evening | ORAL | 0 refills | Status: DC | PRN

## 2020-09-04 NOTE — Progress Notes (Addendum)
Ms. Valerie Friedman denies chest pain or shortness of breath. Patient denies having any s/s of Covid in her household.  Patient denies any known exposure to Covid.   Ms Valerie Friedman does not want General Anesthesia, Family have had problems post op this year- mother has a "clot" at her throat and was told patient told that it was ok.  Ms Valerie Friedman's mother died a few days later. Ms Valerie Friedman's brother had surgery and was discharged, [name] brought her brother back to the hospital, CO2 was elevated, brother was in the hospital 9 days.  Ms Valerie Friedman has spoken to Dr. Sharol Given, he told patient she will have a block and will be given "twilight."  Ms Valerie Friedman states that she left 2 messages at Dr. Jess Barters office asking if she can have anything for anxiety to take in the morning before she comes in. I called and left a message for Valerie Friedman and sent staff message to Barnie Mort, PA-C, with Ms Valerie Friedman's request for something for anxiety and for oders.  I instructed patient to shower with antibiotic soap, if it is available.  Dry off with a clean towel. Do not put lotion, powder, cologne or deodorant or makeup.No jewelry or piercings. Men may shave their face and neck. Woman should not shave. No nail polish, artificial or acrylic nails. Wear clean clothes, brush your teeth. Glasses, contact lens,dentures or partials may not be worn in the OR. If you need to wear them, please bring a case for glasses, do not wear contacts or bring a case, the hospital does not have contact cases, dentures or partials will have to be removed , make sure they are clean, we will provide a denture cup to put them in. You will need some one to drive you home and a responsible person over the age of 55 to stay with you for the first 24 hours after surgery.   Ms Valerie Friedman states that she has gel polish on her toes and that she does not have the money to have it removed. I explained the importance of removing it, she said she could not do it.   Patient that someone in X-Ray told patient that nail polish is not an issue for surgery.

## 2020-09-04 NOTE — Progress Notes (Signed)
Anesthesia Chart Review: Same day workup  Patient seen by cardiologist Dr. Golden Hurter on 06/27/2020 for evaluation of myotonic dystrophy and preop cardiac clearance.  Per assessment, her myotonic dystrophy is mild and not affecting her mobility.  Due to risk factors and some atypical chest pain symptoms, she is recommended to have echo and coronary CTA prior to surgery.  TTE 07/02/2020 showed normal LVEF 65 to 70%, normal wall motion, normal valves.  Coronary CTA 07/09/2020 showed no evidence of CAD, coronary calcium score of 0, normal coronary origin with right dominance.  There is a suspected small membranous VSD with left-to-right flow.  Given this, limited echo with bubble study was ordered.  This showed normal LVF with no evidence of VSD.  OSA, not on CPAP.  Patient will need day of surgery labs and evaluation.  EKG 06/22/20: Normal sinus rhythm.  Rate 89 Left axis deviation. RSR' or QR pattern in V1 suggests right ventricular conduction delay. Left ventricular hypertrophy  CTA coronary morphology 07/09/2020: IMPRESSION: 1. No evidence of CAD, CADRADS = 0.   2. Coronary calcium score of 0. This was 0 percentile for age and sex matched control.   3. Normal coronary origin with right dominance.   4. Suspect small membranous VSD with left to right flow. This could explain the high contrast signal in the RV. Recent 2D echo personally reviewed does not show an obvious VSD. Recommend TEE to further evaluate if clinical suspicion is high.  TTE 07/02/2020:  1. Left ventricular ejection fraction, by estimation, is 65 to 70%. The  left ventricle has normal function. The left ventricle has no regional  wall motion abnormalities. Left ventricular diastolic parameters were  normal.   2. Right ventricular systolic function is normal. The right ventricular  size is normal. Tricuspid regurgitation signal is inadequate for assessing  PA pressure.   3. The mitral valve is normal in structure. No  evidence of mitral valve  regurgitation. No evidence of mitral stenosis.   4. The aortic valve is tricuspid. Aortic valve regurgitation is not  visualized. No aortic stenosis is present.   5. The inferior vena cava is normal in size with greater than 50%  respiratory variability, suggesting right atrial pressure of 3 mmHg.    Valerie Friedman [facility] Short Stay Center/Anesthesiology Phone [phone] 09/04/2020 11:22 AM

## 2020-09-04 NOTE — Anesthesia Preprocedure Evaluation (Addendum)
Anesthesia Evaluation  Patient identified by MRN, date of birth, ID band Patient awake    Reviewed: Allergy & Precautions, NPO status , Patient's Chart, lab work & pertinent test results  History of Anesthesia Complications Negative for: history of anesthetic complications  Airway Mallampati: II  TM Distance: >3 FB Neck ROM: Full    Dental  (+) Teeth Intact   Pulmonary sleep apnea , former smoker,    Pulmonary exam normal        Cardiovascular negative cardio ROS Normal cardiovascular exam     Neuro/Psych  Headaches, Anxiety  Neuromuscular disease (myotonic muscular dystrophy)    GI/Hepatic Neg liver ROS, GERD  ,  Endo/Other  negative endocrine ROS  Renal/GU negative Renal ROS  negative genitourinary   Musculoskeletal  (+) Arthritis , Fibromyalgia -  Abdominal   Peds  Hematology negative hematology ROS (+)   Anesthesia Other Findings  TTE 07/02/2020 showed normal LVEF 65 to 70%, normal wall motion, normal valves.  Coronary CTA 07/09/2020 showed no evidence of CAD, coronary calcium score of 0, normal coronary origin with right dominance.  There is a suspected small membranous VSD with left-to-right flow.  Given this, limited echo with bubble study was ordered.  This showed normal LVF with no evidence of VSD.  Reproductive/Obstetrics                           Anesthesia Physical Anesthesia Plan  ASA: 3  Anesthesia Plan: MAC and Regional   Post-op Pain Management:  Regional for Post-op pain   Induction: Intravenous  PONV Risk Score and Plan: 2 and Propofol infusion, TIVA and Treatment may vary due to age or medical condition  Airway Management Planned: Natural Airway, Nasal Cannula and Simple Face Mask  Additional Equipment: None  Intra-op Plan:   Post-operative Plan:   Informed Consent: I have reviewed the patients History and Physical, chart, labs and discussed the procedure  including the risks, benefits and alternatives for the proposed anesthesia with the patient or authorized representative who has indicated his/her understanding and acceptance.       Plan Discussed with:   Anesthesia Plan Comments: (PAT note by Karoline Caldwell, PA-C: Patient seen by cardiologist Dr. Golden Hurter on 06/27/2020 for evaluation of myotonic dystrophy and preop cardiac clearance.  Per assessment, her myotonic dystrophy is mild and not affecting her mobility.  Due to risk factors and some atypical chest pain symptoms, she is recommended to have echo and coronary CTA prior to surgery.  TTE 07/02/2020 showed normal LVEF 65 to 70%, normal wall motion, normal valves.  Coronary CTA 07/09/2020 showed no evidence of CAD, coronary calcium score of 0, normal coronary origin with right dominance.  There is a suspected small membranous VSD with left-to-right flow.  Given this, limited echo with bubble study was ordered.  This showed normal LVF with no evidence of VSD.  OSA, not on CPAP.  Patient will need day of surgery labs and evaluation.  EKG 06/22/20: Normal sinus rhythm.  Rate 89 Left axis deviation. RSR' or QR pattern in V1 suggests right ventricular conduction delay. Left ventricular hypertrophy  CTA coronary morphology 07/09/2020: IMPRESSION: 1. No evidence of CAD, CADRADS = 0.  2. Coronary calcium score of 0. This was 0 percentile for age and sex matched control.  3. Normal coronary origin with right dominance.  4. Suspect small membranous VSD with left to right flow. This could explain the high contrast signal in the RV.  Recent 2D echo personally reviewed does not show an obvious VSD. Recommend TEE to further evaluate if clinical suspicion is high.  TTE 07/02/2020: 1. Left ventricular ejection fraction, by estimation, is 65 to 70%. The  left ventricle has normal function. The left ventricle has no regional  wall motion abnormalities. Left ventricular diastolic parameters were   normal.  2. Right ventricular systolic function is normal. The right ventricular  size is normal. Tricuspid regurgitation signal is inadequate for assessing  PA pressure.  3. The mitral valve is normal in structure. No evidence of mitral valve  regurgitation. No evidence of mitral stenosis.  4. The aortic valve is tricuspid. Aortic valve regurgitation is not  visualized. No aortic stenosis is present.  5. The inferior vena cava is normal in size with greater than 50%  respiratory variability, suggesting right atrial pressure of 3 mmHg.  )      Anesthesia Quick Evaluation

## 2020-09-05 ENCOUNTER — Encounter (HOSPITAL_COMMUNITY)
Admission: RE | Disposition: A | Discharge status: Home / Self Care | Payer: Self-pay | Source: Home / Self Care | Attending: Orthopedic Surgery

## 2020-09-05 ENCOUNTER — Encounter (HOSPITAL_COMMUNITY): Payer: Self-pay | Admitting: Orthopedic Surgery

## 2020-09-05 ENCOUNTER — Ambulatory Visit (HOSPITAL_COMMUNITY): Payer: 59 | Admitting: Physician Assistant

## 2020-09-05 ENCOUNTER — Ambulatory Visit (INDEPENDENT_AMBULATORY_CARE_PROVIDER_SITE_OTHER): Payer: 59 | Admitting: Family

## 2020-09-05 ENCOUNTER — Ambulatory Visit (HOSPITAL_COMMUNITY): Payer: 59

## 2020-09-05 ENCOUNTER — Ambulatory Visit (HOSPITAL_COMMUNITY)
Admission: RE | Admit: 2020-09-05 | Discharge: 2020-09-05 | Disposition: A | Discharge status: Home / Self Care | Payer: 59 | Attending: Orthopedic Surgery | Admitting: Orthopedic Surgery

## 2020-09-05 DIAGNOSIS — Z8249 Family history of ischemic heart disease and other diseases of the circulatory system: Secondary | ICD-10-CM | POA: Insufficient documentation

## 2020-09-05 DIAGNOSIS — S93325D Dislocation of tarsometatarsal joint of left foot, subsequent encounter: Secondary | ICD-10-CM

## 2020-09-05 DIAGNOSIS — Z881 Allergy status to other antibiotic agents status: Secondary | ICD-10-CM | POA: Diagnosis not present

## 2020-09-05 DIAGNOSIS — M25572 Pain in left ankle and joints of left foot: Secondary | ICD-10-CM

## 2020-09-05 DIAGNOSIS — Z886 Allergy status to analgesic agent status: Secondary | ICD-10-CM | POA: Insufficient documentation

## 2020-09-05 DIAGNOSIS — Z833 Family history of diabetes mellitus: Secondary | ICD-10-CM | POA: Diagnosis not present

## 2020-09-05 DIAGNOSIS — Z820 Family history of epilepsy and other diseases of the nervous system: Secondary | ICD-10-CM | POA: Diagnosis not present

## 2020-09-05 DIAGNOSIS — Z888 Allergy status to other drugs, medicaments and biological substances status: Secondary | ICD-10-CM | POA: Insufficient documentation

## 2020-09-05 DIAGNOSIS — Z87891 Personal history of nicotine dependence: Secondary | ICD-10-CM | POA: Diagnosis not present

## 2020-09-05 DIAGNOSIS — S93325A Dislocation of tarsometatarsal joint of left foot, initial encounter: Secondary | ICD-10-CM | POA: Diagnosis present

## 2020-09-05 DIAGNOSIS — Z9102 Food additives allergy status: Secondary | ICD-10-CM | POA: Diagnosis not present

## 2020-09-05 DIAGNOSIS — S93325S Dislocation of tarsometatarsal joint of left foot, sequela: Secondary | ICD-10-CM

## 2020-09-05 DIAGNOSIS — Z8349 Family history of other endocrine, nutritional and metabolic diseases: Secondary | ICD-10-CM | POA: Insufficient documentation

## 2020-09-05 DIAGNOSIS — X58XXXA Exposure to other specified factors, initial encounter: Secondary | ICD-10-CM | POA: Diagnosis not present

## 2020-09-05 DIAGNOSIS — Z91040 Latex allergy status: Secondary | ICD-10-CM | POA: Insufficient documentation

## 2020-09-05 DIAGNOSIS — Z823 Family history of stroke: Secondary | ICD-10-CM | POA: Diagnosis not present

## 2020-09-05 DIAGNOSIS — Z79899 Other long term (current) drug therapy: Secondary | ICD-10-CM | POA: Diagnosis not present

## 2020-09-05 HISTORY — DX: Other complications of anesthesia, initial encounter: T88.59XA

## 2020-09-05 HISTORY — DX: Pneumonia, unspecified organism: J18.9

## 2020-09-05 HISTORY — PX: OPEN REDUCTION INTERNAL FIXATION (ORIF) FOOT LISFRANC FRACTURE: SHX5990

## 2020-09-05 HISTORY — DX: Anemia, unspecified: D64.9

## 2020-09-05 LAB — CBC
HCT: 45.5 % (ref 36.0–46.0)
Hemoglobin: 14.7 g/dL (ref 12.0–15.0)
MCH: 30.6 pg (ref 26.0–34.0)
MCHC: 32.3 g/dL (ref 30.0–36.0)
MCV: 94.6 fL (ref 80.0–100.0)
Platelets: 224 10*3/uL (ref 150–400)
RBC: 4.81 MIL/uL (ref 3.87–5.11)
RDW: 13.1 % (ref 11.5–15.5)
WBC: 7.4 10*3/uL (ref 4.0–10.5)
nRBC: 0 % (ref 0.0–0.2)

## 2020-09-05 LAB — POCT PREGNANCY, URINE: Preg Test, Ur: NEGATIVE

## 2020-09-05 SURGERY — OPEN REDUCTION INTERNAL FIXATION (ORIF) FOOT LISFRANC FRACTURE
Anesthesia: Regional | Site: Foot | Laterality: Left

## 2020-09-05 MED ORDER — ROPIVACAINE HCL 5 MG/ML IJ SOLN
INTRAMUSCULAR | Status: DC | PRN
  Administered 2020-09-05: 10 mL via PERINEURAL
  Administered 2020-09-05: 20 mL via PERINEURAL

## 2020-09-05 MED ORDER — DEXAMETHASONE SODIUM PHOSPHATE 10 MG/ML IJ SOLN
INTRAMUSCULAR | Status: DC | PRN
  Administered 2020-09-05: 6 mg

## 2020-09-05 MED ORDER — CHLORHEXIDINE GLUCONATE 0.12 % MT SOLN
15.0000 mL | Freq: Once | OROMUCOSAL | Status: AC
  Filled 2020-09-05: qty 15

## 2020-09-05 MED ORDER — MIDAZOLAM HCL 5 MG/5ML IJ SOLN
INTRAMUSCULAR | Status: DC | PRN
  Administered 2020-09-05: 2 mg via INTRAVENOUS

## 2020-09-05 MED ORDER — AMISULPRIDE (ANTIEMETIC) 5 MG/2ML IV SOLN: 10.0000 mg | Freq: Once | INTRAVENOUS | Status: DC | PRN

## 2020-09-05 MED ORDER — CEFAZOLIN SODIUM-DEXTROSE 2-4 GM/100ML-% IV SOLN
2.0000 g | INTRAVENOUS | Status: AC
  Administered 2020-09-05: 2 g via INTRAVENOUS
  Filled 2020-09-05: qty 100

## 2020-09-05 MED ORDER — LIDOCAINE 2% (20 MG/ML) 5 ML SYRINGE
INTRAMUSCULAR | Status: AC
  Filled 2020-09-05: qty 5

## 2020-09-05 MED ORDER — ONDANSETRON HCL 4 MG/2ML IJ SOLN
INTRAMUSCULAR | Status: AC
  Filled 2020-09-05: qty 4

## 2020-09-05 MED ORDER — ONDANSETRON HCL 4 MG/2ML IJ SOLN
INTRAMUSCULAR | Status: DC | PRN
  Administered 2020-09-05: 4 mg via INTRAVENOUS

## 2020-09-05 MED ORDER — FENTANYL CITRATE (PF) 100 MCG/2ML IJ SOLN
25.0000 ug | INTRAMUSCULAR | Status: DC | PRN
  Administered 2020-09-05 (×2): 50 ug via INTRAVENOUS

## 2020-09-05 MED ORDER — ASPIRIN EC 81 MG PO TBEC: 81.0000 mg | DELAYED_RELEASE_TABLET | Freq: Every day | ORAL | 2 refills | Status: DC

## 2020-09-05 MED ORDER — FENTANYL CITRATE (PF) 250 MCG/5ML IJ SOLN
INTRAMUSCULAR | Status: AC
  Filled 2020-09-05: qty 5

## 2020-09-05 MED ORDER — FENTANYL CITRATE (PF) 100 MCG/2ML IJ SOLN
INTRAMUSCULAR | Status: AC
  Filled 2020-09-05: qty 2

## 2020-09-05 MED ORDER — PROPOFOL 10 MG/ML IV BOLUS
INTRAVENOUS | Status: AC
  Filled 2020-09-05: qty 20

## 2020-09-05 MED ORDER — LIDOCAINE 2% (20 MG/ML) 5 ML SYRINGE
INTRAMUSCULAR | Status: DC | PRN
  Administered 2020-09-05: 40 mg via INTRAVENOUS

## 2020-09-05 MED ORDER — OXYCODONE HCL 5 MG/5ML PO SOLN: 5.0000 mg | Freq: Once | ORAL | Status: AC | PRN

## 2020-09-05 MED ORDER — 0.9 % SODIUM CHLORIDE (POUR BTL) OPTIME
TOPICAL | Status: DC | PRN
  Administered 2020-09-05: 1000 mL

## 2020-09-05 MED ORDER — ONDANSETRON HCL 4 MG/2ML IJ SOLN: 4.0000 mg | Freq: Once | INTRAMUSCULAR | Status: DC | PRN

## 2020-09-05 MED ORDER — FENTANYL CITRATE (PF) 100 MCG/2ML IJ SOLN
INTRAMUSCULAR | Status: DC | PRN
  Administered 2020-09-05 (×2): 25 ug via INTRAVENOUS

## 2020-09-05 MED ORDER — OXYCODONE-ACETAMINOPHEN 5-325 MG PO TABS: 1.0000 | ORAL_TABLET | ORAL | 0 refills | Status: DC | PRN

## 2020-09-05 MED ORDER — OXYCODONE HCL 5 MG PO TABS
5.0000 mg | ORAL_TABLET | Freq: Once | ORAL | Status: AC | PRN
  Administered 2020-09-05: 5 mg via ORAL

## 2020-09-05 MED ORDER — PROPOFOL 10 MG/ML IV BOLUS
INTRAVENOUS | Status: DC | PRN
  Administered 2020-09-05 (×2): 20 mg via INTRAVENOUS
  Administered 2020-09-05: 110 mg via INTRAVENOUS

## 2020-09-05 MED ORDER — ORAL CARE MOUTH RINSE
15.0000 mL | Freq: Once | OROMUCOSAL | Status: AC
  Administered 2020-09-05: 15 mL via OROMUCOSAL

## 2020-09-05 MED ORDER — LACTATED RINGERS IV SOLN: INTRAVENOUS | Status: DC

## 2020-09-05 MED ORDER — MIDAZOLAM HCL 2 MG/2ML IJ SOLN
INTRAMUSCULAR | Status: AC
  Filled 2020-09-05: qty 2

## 2020-09-05 MED ORDER — OXYCODONE HCL 5 MG PO TABS
ORAL_TABLET | ORAL | Status: AC
  Filled 2020-09-05: qty 1

## 2020-09-05 SURGICAL SUPPLY — 50 items
BAG COUNTER SPONGE SURGICOUNT (BAG) ×2 IMPLANT
BAG SPNG CNTER NS LX DISP (BAG) ×1
BANDAGE ESMARK 6X9 LF (GAUZE/BANDAGES/DRESSINGS) IMPLANT
BIT DRILL 3.0 6IN (DRILL) IMPLANT
BLADE LONG MED 31X9 (MISCELLANEOUS) ×1 IMPLANT
BLADE SAW SGTL HD 18.5X60.5X1. (BLADE) ×2 IMPLANT
BLADE SURG 10 STRL SS (BLADE) IMPLANT
BLADE SURG 21 STRL SS (BLADE) ×1 IMPLANT
BNDG CMPR 9X4 STRL LF SNTH (GAUZE/BANDAGES/DRESSINGS) ×1
BNDG CMPR 9X6 STRL LF SNTH (GAUZE/BANDAGES/DRESSINGS)
BNDG COHESIVE 4X5 TAN ST LF (GAUZE/BANDAGES/DRESSINGS) ×1 IMPLANT
BNDG COHESIVE 4X5 TAN STRL (GAUZE/BANDAGES/DRESSINGS) ×2 IMPLANT
BNDG ESMARK 4X9 LF (GAUZE/BANDAGES/DRESSINGS) ×1 IMPLANT
BNDG ESMARK 6X9 LF (GAUZE/BANDAGES/DRESSINGS)
BNDG GAUZE ELAST 4 BULKY (GAUZE/BANDAGES/DRESSINGS) ×4 IMPLANT
COTTON STERILE ROLL (GAUZE/BANDAGES/DRESSINGS) ×2 IMPLANT
COVER MAYO STAND STRL (DRAPES) IMPLANT
COVER SURGICAL LIGHT HANDLE (MISCELLANEOUS) ×4 IMPLANT
DRAPE INCISE IOBAN 66X45 STRL (DRAPES) ×2 IMPLANT
DRAPE OEC MINIVIEW 54X84 (DRAPES) IMPLANT
DRAPE U-SHAPE 47X51 STRL (DRAPES) ×2 IMPLANT
DRILL 3.0 6IN (DRILL) ×2
DRSG ADAPTIC 3X8 NADH LF (GAUZE/BANDAGES/DRESSINGS) ×3 IMPLANT
DURAPREP 26ML APPLICATOR (WOUND CARE) ×2 IMPLANT
ELECT REM PT RETURN 9FT ADLT (ELECTROSURGICAL) ×2
ELECTRODE REM PT RTRN 9FT ADLT (ELECTROSURGICAL) ×1 IMPLANT
GAUZE SPONGE 4X4 12PLY STRL (GAUZE/BANDAGES/DRESSINGS) ×2 IMPLANT
GLOVE SURG ORTHO LTX SZ9 (GLOVE) ×2 IMPLANT
GLOVE SURG UNDER POLY LF SZ9 (GLOVE) ×2 IMPLANT
GOWN STRL REUS W/ TWL XL LVL3 (GOWN DISPOSABLE) ×3 IMPLANT
GOWN STRL REUS W/TWL XL LVL3 (GOWN DISPOSABLE) ×2
GUIDEWIRE 1.6 6IN (WIRE) ×2 IMPLANT
KIT BASIN OR (CUSTOM PROCEDURE TRAY) ×2 IMPLANT
KIT STAPLE ARCUS 16X13 STRL (Staple) ×1 IMPLANT
KIT TURNOVER KIT B (KITS) ×2 IMPLANT
MANIFOLD NEPTUNE II (INSTRUMENTS) ×2 IMPLANT
NS IRRIG 1000ML POUR BTL (IV SOLUTION) ×2 IMPLANT
PACK ORTHO EXTREMITY (CUSTOM PROCEDURE TRAY) ×2 IMPLANT
PAD ARMBOARD 7.5X6 YLW CONV (MISCELLANEOUS) ×4 IMPLANT
PAD CAST 4YDX4 CTTN HI CHSV (CAST SUPPLIES) ×1 IMPLANT
PADDING CAST COTTON 4X4 STRL (CAST SUPPLIES) ×2
SCREW CANN SHT HDLS 4X34 (Screw) ×1 IMPLANT
SCREW CANN SHT HDLS 4X40 (Screw) ×1 IMPLANT
SPONGE T-LAP 18X18 ~~LOC~~+RFID (SPONGE) ×2 IMPLANT
SUCTION FRAZIER HANDLE 10FR (MISCELLANEOUS) ×2
SUCTION TUBE FRAZIER 10FR DISP (MISCELLANEOUS) ×1 IMPLANT
SUT ETHILON 2 0 PSLX (SUTURE) ×6 IMPLANT
TOWEL GREEN STERILE (TOWEL DISPOSABLE) ×2 IMPLANT
TOWEL GREEN STERILE FF (TOWEL DISPOSABLE) ×2 IMPLANT
TUBE CONNECTING 12X1/4 (SUCTIONS) ×2 IMPLANT

## 2020-09-05 NOTE — Anesthesia Postprocedure Evaluation (Signed)
Anesthesia Post Note  Patient: Valerie Friedman  Procedure(s) Performed: OPEN REDUCTION INTERNAL FIXATION (ORIF) LEFT FOOT LISFRANC FRACTURE DISLOCATION (Left: Foot)     Patient location during evaluation: PACU Anesthesia Type: Regional and General Level of consciousness: awake and alert Pain management: pain level controlled Vital Signs Assessment: post-procedure vital signs reviewed and stable Respiratory status: spontaneous breathing, nonlabored ventilation and respiratory function stable Cardiovascular status: blood pressure returned to baseline and stable Postop Assessment: no apparent nausea or vomiting Anesthetic complications: no   No notable events documented.  Last Vitals:  Vitals:   09/05/20 0908 09/05/20 0923  BP: 133/82 (!) 146/85  Pulse: 85 74  Resp: 11 17  Temp: 36.4 C   SpO2: 96% 95%    Last Pain:  Vitals:   09/05/20 0900  TempSrc:   PainSc: 3                  Lidia Collum

## 2020-09-05 NOTE — Anesthesia Procedure Notes (Signed)
Anesthesia Regional Block: Adductor canal block   Pre-Anesthetic Checklist: , timeout performed,  Correct Patient, Correct Site, Correct Laterality,  Correct Procedure, Correct Position, site marked,  Risks and benefits discussed,  Surgical consent,  Pre-op evaluation,  At surgeon's request and post-op pain management  Laterality: Left  Prep: chloraprep       Needles:  Injection technique: Single-shot  Needle Type: Echogenic Stimulator Needle     Needle Length: 10cm  Needle Gauge: 20     Additional Needles:   Procedures:,,,, ultrasound used (permanent image in chart),,    Narrative:  Start time: 09/05/2020 7:14 AM End time: 09/05/2020 7:17 AM Injection made incrementally with aspirations every 5 mL.  Performed by: Personally  Anesthesiologist: Lidia Collum, MD  Additional Notes: Standard monitors applied. Skin prepped. Good needle visualization with ultrasound. Injection made in 5cc increments with no resistance to injection. Patient tolerated the procedure well.

## 2020-09-05 NOTE — Anesthesia Procedure Notes (Signed)
Anesthesia Regional Block: Popliteal block   Pre-Anesthetic Checklist: , timeout performed,  Correct Patient, Correct Site, Correct Laterality,  Correct Procedure, Correct Position, site marked,  Risks and benefits discussed,  Surgical consent,  Pre-op evaluation,  At surgeon's request and post-op pain management  Laterality: Left  Prep: chloraprep       Needles:  Injection technique: Single-shot  Needle Type: Echogenic Stimulator Needle     Needle Length: 10cm  Needle Gauge: 20     Additional Needles:   Procedures:,,,, ultrasound used (permanent image in chart),,    Narrative:  Start time: 09/05/2020 7:17 AM End time: 09/05/2020 7:25 AM  Performed by: Personally  Anesthesiologist: Lidia Collum, MD  Additional Notes: Standard monitors applied. Skin prepped. Good needle visualization with ultrasound. Injection made in 5cc increments with no resistance to injection. Patient tolerated the procedure well.

## 2020-09-05 NOTE — Op Note (Signed)
09/05/2020  8:46 AM  PATIENT:  Valerie Friedman    PRE-OPERATIVE DIAGNOSIS:  Lisfranc Fracture Dislocation Left Foot  POST-OPERATIVE DIAGNOSIS:  Same  PROCEDURE:  OPEN REDUCTION INTERNAL FIXATION (ORIF) LEFT FOOT LISFRANC FRACTURE DISLOCATION  SURGEON:  [name], MD  PHYSICIAN ASSISTANT:None ANESTHESIA:   General  PREOPERATIVE INDICATIONS:  [name] is a  45 y.o. female with a diagnosis of Lisfranc Fracture Dislocation Left Foot who failed conservative measures and elected for surgical management.    The risks benefits and alternatives were discussed with the patient preoperatively including but not limited to the risks of infection, bleeding, nerve injury, cardiopulmonary complications, the need for revision surgery, among others, and the patient was willing to proceed.  OPERATIVE IMPLANTS: Headless cannulated screws x2 with a staple.  '@ENCIMAGES'$ @  OPERATIVE FINDINGS: C-arm fluoroscopy verified reduction of the fusion across the base of the first and second metatarsals.  OPERATIVE PROCEDURE: Patient brought the operating room after undergoing a regional anesthetic she then underwent a general anesthesia.  After adequate levels anesthesia were obtained patient's left lower extremity was prepped using DuraPrep draped into a sterile field a timeout was called.  A dorsal incision was made in the first webspace.  Blunt dissection was carried down to the EHL and anterior tibial tendons.  The interval between the EHL and the anterior tibial tendon was used to approach the base of the first metatarsal medial cuneiform.  The joint was grossly unstable from the fracture dislocation.  The joint was debrided of articular cartilage with an oscillating saw the joint was cleansed reduced and stabilized with a K wire.  C-arm fluoroscopy verified alignment and a 40 mm screw was used to stabilize the base of the first metatarsal medial cuneiform.  A separate staple was also placed 90  degrees to the screw to provide additional fixation.  A guidewire was then inserted from the medial cuneiform through the base of the second metatarsal just distal to the fracture.  See arthroscopy verified alignment and this was stabilized with a 34 mm screw.  See arthroscopy verified alignment.  The wound was irrigated with normal saline incision closed using 2-0 nylon a sterile dressing was applied patient was extubated taken the PACU in stable condition   DISCHARGE PLANNING:  Antibiotic duration: Preoperative antibiotics  Weightbearing: Touchdown weightbearing on the left  Pain medication: Prescription for Percocet  Dressing care/ Wound VAC: Dry dressing change as needed  Ambulatory devices: Walker or crutches or kneeling scooter  Discharge to: Home.  Follow-up: In the office 1 week post operative.

## 2020-09-05 NOTE — H&P (Signed)
Valerie Friedman is an 45 y.o. female.   Chief Complaint: left Foot Pain HPI: Patient presents today for follow-up on her left foot.  She has a history of a left Lisfranc injury.  This was to be fixed approximately 5 weeks ago.  Unfortunately it was required that she needed further cardiac clearance.  She continues to have pain in the midfoot area into the toes.  Past Medical History:  Diagnosis Date   Anemia    Anxiety    Arthritis    Cataracts, bilateral    MD just watching   Chronic lower back pain DUE TO MYOTONIC DYSTROPHY   Complication of anesthesia    slow to awaken , respirations either slow or fast.   Depression    patient denies this dx,  Therapist said that patient that she was fine after 2 years- ~2000   Endometriosis    Family history of adverse reaction to anesthesia 2022   brother was discharged after surgery had to be  taken back to hospital  CO2 was elevated, he was in the hospital for 9 days.   GERD (gastroesophageal reflux disease)    diet controlled   History of right foot drop    Migraine    Myotonic dystrophy ([place]) DX JULY 2009  ----- TYPE 1   FOLLOWED BY DR. LOVE   Numbness of fingers BILATERAL HANDS/  OCCASIONLLY RADIATES UP ARMS   Pelvic pain in female    Pneumonia    Seasonal allergies    Sleep apnea    does not use cpap   Vertigo    no current problems in the last year   Vitamin D deficiency     Past Surgical History:  Procedure Laterality Date   BENIGN BREAST BX   FEB  2011   BREAST BIOPSY Right 2011   Benign Stereo    LAPAROSCOPY  05/01/2011   Procedure: LAPAROSCOPY DIAGNOSTIC;  Surgeon: Bennetta Laos, MD;  Location: [facility];  Service: Gynecology;  Laterality: N/A;  with bilateral chromopertubation, aspiration of functional left ovarian cyst, excision of two hydatid cysts   MUSCLE BIOPSY  02-25-2007   LEFT THIGH QUADRICEP BX X3  FOR MYOSITIS   SURG. FOR REMOVAL OF TEETH FRAGMENTS    MARCH 2012   ORAL SURGEON OFFICE    WISDOM TOOTH EXTRACTION      Family History  Problem Relation Age of Onset   Diabetes Mother    Hypertension Father    Hyperlipidemia Father    Heart disease Father    Hypertension Brother    Diabetes Brother    Atrial fibrillation Brother    Heart disease Maternal Grandmother    Diabetes Maternal Grandfather    Heart disease Maternal Grandfather    Coronary artery disease Maternal Grandfather    Heart disease Paternal Grandmother    Stroke Paternal Grandmother    CAD Paternal Grandmother    Heart disease Paternal Grandfather    Cancer Paternal Uncle        Colon cancer   Parkinsonism Paternal Uncle    Coronary artery disease Maternal Uncle    Social History:  reports that she quit smoking about 4 years ago. Her smoking use included cigarettes. She has a 2.50 pack-year smoking history. She has never used smokeless tobacco. She reports that she does not currently use alcohol. She reports that she does not use drugs.  Allergies:  Allergies  Allergen Reactions   Latex Hives   Aleve [Naproxen Sodium] Other (  See Comments)    "GETS THE CHILLS"   Ciprocin-Fluocin-Procin [Fluocinolone Acetonide] Other (See Comments)    GI UPSET AND HEADACHE   Macrobid [Nitrofurantoin Monohydrate Macrocrystals] Other (See Comments)    GI UPSET; headache   Tramadol Hcl     Pt had a Hx of dependency on this medication    Yellow Dyes (Non-Tartrazine) Nausea And Vomiting    YELLOW DYE #5     Other Rash    [place] JELLY    Medications Prior to Admission  Medication Sig Dispense Refill   ALPRAZolam (XANAX) 0.5 MG tablet Take 1 tablet (0.5 mg total) by mouth at bedtime as needed for sleep or anxiety. 1 tablet 0   cetirizine (ZYRTEC) 10 MG tablet Take 10 mg by mouth daily as needed for allergies.     famotidine (PEPCID) 10 MG tablet Take 10 mg by mouth daily as needed for heartburn or indigestion.     Multiple Vitamin (MULTIVITAMIN WITH MINERALS) TABS tablet Take 1 tablet by mouth in the morning. Women's  One A Day     oxyCODONE-acetaminophen (PERCOCET/ROXICET) 5-325 MG tablet Take 1 tablet by mouth every 8 (eight) hours as needed for severe pain or moderate pain. (Patient taking differently: Take 1 tablet by mouth 2 (two) times daily as needed for severe pain or moderate pain.) 30 tablet 0   VITAMIN E PO Take 1 capsule by mouth in the morning.     acetaminophen (TYLENOL) 500 MG tablet Take 1,000 mg by mouth every 6 (six) hours as needed.     acetaminophen (TYLENOL) 650 MG CR tablet Take 1,300 mg by mouth every 8 (eight) hours as needed for pain.     metoprolol tartrate (LOPRESSOR) 100 MG tablet Take 1 tablet (100 mg total) by mouth once for 1 dose. Take 1 tablet (100 mg total) two hours prior to CT scan. (Patient not taking: Reported on 08/30/2020) 1 tablet 0    No results found for this or any previous visit (from the past 48 hour(s)). No results found.  Review of Systems  All other systems reviewed and are negative.  Blood pressure (!) 170/93, pulse 99, temperature 98.4 F (36.9 C), temperature source Oral, resp. rate 19, height 5' 2.5" (1.588 m), weight 64.4 kg, last menstrual period 08/24/2020, SpO2 95 %. Physical Exam  Patient is alert, oriented, no adenopathy, well-dressed, normal affect, normal respiratory effort. Examination of her foot she has palpable dorsalis pedis pulse.  No significant soft tissue swelling no cellulitis skin is in good condition.  She is exquisitely tender through the Lisfranc complex heart RRR Lungs clear   Assessment/Plan  1. Pain in left foot       Plan: We will schedule surgery the patient would like to have surgery soon as possible Follow-Up Instructions: No follow-ups on file.  Bevely Palmer Ameirah Khatoon, PA 09/05/2020, 6:28 AM

## 2020-09-05 NOTE — Interval H&P Note (Signed)
History and Physical Interval Note:  09/05/2020 6:53 AM  Valerie Friedman  has presented today for surgery, with the diagnosis of Lisfranc Fracture Dislocation Left Foot.  The various methods of treatment have been discussed with the patient and family. After consideration of risks, benefits and other options for treatment, the patient has consented to  Procedure(s): OPEN REDUCTION INTERNAL FIXATION (ORIF) LEFT FOOT LISFRANC FRACTURE DISLOCATION (Left) as a surgical intervention.  The patient's history has been reviewed, patient examined, no change in status, stable for surgery.  I have reviewed the patient's chart and labs.  Questions were answered to the patient's satisfaction.     [name]

## 2020-09-05 NOTE — Anesthesia Procedure Notes (Signed)
Procedure Name: LMA Insertion Date/Time: 09/05/2020 7:59 AM Performed by: Gwyndolyn Saxon, CRNA Pre-anesthesia Checklist: Patient identified, Emergency Drugs available, Suction available and Patient being monitored Patient Re-evaluated:Patient Re-evaluated prior to induction Oxygen Delivery Method: Circle System Utilized Preoxygenation: Pre-oxygenation with 100% oxygen Induction Type: IV induction Ventilation: Mask ventilation without difficulty LMA: LMA inserted LMA Size: 4.0 Number of attempts: 1 Airway Equipment and Method: Bite block Placement Confirmation: positive ETCO2 Tube secured with: Tape Dental Injury: Teeth and Oropharynx as per pre-operative assessment

## 2020-09-05 NOTE — Transfer of Care (Signed)
Immediate Anesthesia Transfer of Care Note  Patient: Valerie Friedman  Procedure(s) Performed: OPEN REDUCTION INTERNAL FIXATION (ORIF) LEFT FOOT LISFRANC FRACTURE DISLOCATION (Left: Foot)  Patient Location: PACU  Anesthesia Type:General and Regional  Level of Consciousness: drowsy and patient cooperative  Airway & Oxygen Therapy: Patient Spontanous Breathing and Patient connected to nasal cannula oxygen  Post-op Assessment: Report given to RN and Post -op Vital signs reviewed and stable  Post vital signs: Reviewed and stable  Last Vitals:  Vitals Value Taken Time  BP 124/78 09/05/20 0838  Temp 36.4 C 09/05/20 0838  Pulse 85 09/05/20 0844  Resp 13 09/05/20 0844  SpO2 93 % 09/05/20 0844  Vitals shown include unvalidated device data.  Last Pain:  Vitals:   09/05/20 0838  TempSrc:   PainSc: Asleep      Patients Stated Pain Goal: 0 ([id number])  Complications: No notable events documented.

## 2020-09-05 NOTE — Progress Notes (Signed)
Post-Op Visit Note   Patient: Valerie Friedman           Date of Birth: [birth year]           MRN: [id number] Visit Date: 09/05/2020 PCP: Laurey Morale, MD  Chief Complaint:  Chief Complaint  Patient presents with   Left Foot - Follow-up, Wound Check    HPI:  HPI The patient is a 45 year old woman seen today for concern of bleeding from her surgical dressing.  She had ORIF for Lisfranc complex earlier today  Ortho Exam Incision is well approximated sutures there is no gaping there is moderate bloody drainage there is no surrounding erythema no signs of infection no dehiscence.  Visit Diagnoses: No diagnosis found.  Plan: dressing applied. To be kept in place until next follow up. Elevate for swelling. May use ice. Minimize weight bearing. Reassurance provided. Follow up in 1 week.    Follow-Up Instructions: No follow-ups on file.   Imaging: DG MINI C-ARM IMAGE ONLY  Result Date: 09/05/2020 There is no interpretation for this exam.  This order is for images obtained during a surgical procedure.  Please See "Surgeries" Tab for more information regarding the procedure.    Orders:  No orders of the defined types were placed in this encounter.  No orders of the defined types were placed in this encounter.    PMFS History: Patient Active Problem List   Diagnosis Date Noted   Lisfranc dislocation, left, sequela    Vertigo 10/28/2017   GERD (gastroesophageal reflux disease) 06/15/2017   Circadian rhythm sleep disorder 04/30/2017   OSA (obstructive sleep apnea) 04/30/2017   Vitamin D deficiency 04/16/2016   Foot drop, right 03/26/2016   Anxiety    Myotonic dystrophy ([place])    Fibromyalgia    Migraine    WEIGHT LOSS 07/11/2008   HEADACHE 07/11/2008   NICOTINE ADDICTION 10/20/2007   MYOTONIC MUSCULAR DYSTROPHY 09/24/2007   MYALGIA/MYOSITIS NOS 08/03/2006   Depression 06/23/2006   Past Medical History:  Diagnosis Date   Anemia    Anxiety    Arthritis     Cataracts, bilateral    MD just watching   Chronic lower back pain DUE TO MYOTONIC DYSTROPHY   Complication of anesthesia    slow to awaken , respirations either slow or fast.   Depression    patient denies this dx,  Therapist said that patient that she was fine after 2 years- ~2000   Endometriosis    Family history of adverse reaction to anesthesia 2022   brother was discharged after surgery had to be  taken back to hospital  CO2 was elevated, he was in the hospital for 9 days.   GERD (gastroesophageal reflux disease)    diet controlled   History of right foot drop    Migraine    Myotonic dystrophy ([place]) DX JULY 2009  ----- TYPE 1   FOLLOWED BY DR. LOVE   Numbness of fingers BILATERAL HANDS/  OCCASIONLLY RADIATES UP ARMS   Pelvic pain in female    Pneumonia    Seasonal allergies    Sleep apnea    does not use cpap   Vertigo    no current problems in the last year   Vitamin D deficiency     Family History  Problem Relation Age of Onset   Diabetes Mother    Hypertension Father    Hyperlipidemia Father    Heart disease Father    Hypertension Brother    Diabetes  Brother    Atrial fibrillation Brother    Heart disease Maternal Grandmother    Diabetes Maternal Grandfather    Heart disease Maternal Grandfather    Coronary artery disease Maternal Grandfather    Heart disease Paternal Grandmother    Stroke Paternal Grandmother    CAD Paternal Grandmother    Heart disease Paternal Grandfather    Cancer Paternal Uncle        Colon cancer   Parkinsonism Paternal Uncle    Coronary artery disease Maternal Uncle     Past Surgical History:  Procedure Laterality Date   BENIGN BREAST BX   FEB  2011   BREAST BIOPSY Right 2011   Benign Stereo    LAPAROSCOPY  05/01/2011   Procedure: LAPAROSCOPY DIAGNOSTIC;  Surgeon: Bennetta Laos, MD;  Location: [facility];  Service: Gynecology;  Laterality: N/A;  with bilateral chromopertubation, aspiration of functional left  ovarian cyst, excision of two hydatid cysts   MUSCLE BIOPSY  02-25-2007   LEFT THIGH QUADRICEP BX X3  FOR MYOSITIS   SURG. FOR REMOVAL OF TEETH FRAGMENTS    MARCH 2012   ORAL SURGEON OFFICE   WISDOM TOOTH EXTRACTION     Social History   Occupational History   Not on file  Tobacco Use   Smoking status: Former    Packs/day: 0.25    Years: 10.00    Pack years: 2.50    Types: Cigarettes    Quit date: 06/28/2016    Years since quitting: 4.1   Smokeless tobacco: Never   Tobacco comments:    varies, 0-0.5 pack per day  Vaping Use   Vaping Use: Former   Devices: 1 year none since 2019  Substance and Sexual Activity   Alcohol use: Not Currently    Alcohol/week: 0.0 standard drinks    Comment: rare   Drug use: No   Sexual activity: Not Currently    Birth control/protection: None

## 2020-09-06 ENCOUNTER — Encounter (HOSPITAL_COMMUNITY): Payer: Self-pay | Admitting: Orthopedic Surgery

## 2020-09-07 ENCOUNTER — Other Ambulatory Visit: Payer: Self-pay | Admitting: Family

## 2020-09-07 ENCOUNTER — Telehealth: Payer: Self-pay

## 2020-09-07 MED ORDER — TIZANIDINE HCL 4 MG PO TABS: 4.0000 mg | ORAL_TABLET | Freq: Every day | ORAL | 1 refills | Status: DC

## 2020-09-07 NOTE — Telephone Encounter (Signed)
Patient called stating that she started having muscle spasms in her left foot last night that are painful.  Stated that she is taking Percocet every 4 hrs as needed, but would like to know if she can take something else?  Patient had ORIF on left foot.  Cb# [phone].  Please advise.  Thank you.

## 2020-09-07 NOTE — Telephone Encounter (Signed)
Will send in muscle relaxer

## 2020-09-07 NOTE — Telephone Encounter (Signed)
Please see below and advise.

## 2020-09-07 NOTE — Telephone Encounter (Signed)
Called pt to advise that this has been done.  

## 2020-09-12 ENCOUNTER — Ambulatory Visit (INDEPENDENT_AMBULATORY_CARE_PROVIDER_SITE_OTHER): Payer: 59 | Admitting: Family

## 2020-09-12 ENCOUNTER — Encounter: Payer: Self-pay | Admitting: Family

## 2020-09-12 DIAGNOSIS — S93325S Dislocation of tarsometatarsal joint of left foot, sequela: Secondary | ICD-10-CM

## 2020-09-12 DIAGNOSIS — M25752 Osteophyte, left hip: Secondary | ICD-10-CM

## 2020-09-12 NOTE — Progress Notes (Signed)
Post-Op Visit Note   Patient: Valerie Friedman           Date of Birth: 02-22-1975           MRN: [id number] Visit Date: 09/12/2020 PCP: Laurey Morale, MD  Chief Complaint: No chief complaint on file.   HPI:  HPI The patient is a 45 year old woman seen 1 week status post Lisfranc repair Ortho Exam Incision well approximated sutures is healing well there is no gaping no drainage no erythema no sign of infection  Visit Diagnoses: No diagnosis found.  Plan: Begin daily Dial soap cleansing.  Dry dressing changes.  Nonweightbearing.  Follow-up in the office in 1 week with radiographs of the foot.  Follow-Up Instructions: No follow-ups on file.   Imaging: No results found.  Orders:  No orders of the defined types were placed in this encounter.  No orders of the defined types were placed in this encounter.    PMFS History: Patient Active Problem List   Diagnosis Date Noted   Lisfranc dislocation, left, sequela    Vertigo 10/28/2017   GERD (gastroesophageal reflux disease) 06/15/2017   Circadian rhythm sleep disorder 04/30/2017   OSA (obstructive sleep apnea) 04/30/2017   Vitamin D deficiency 04/16/2016   Foot drop, right 03/26/2016   Anxiety    Myotonic dystrophy ([place])    Fibromyalgia    Migraine    WEIGHT LOSS 07/11/2008   HEADACHE 07/11/2008   NICOTINE ADDICTION 10/20/2007   MYOTONIC MUSCULAR DYSTROPHY 09/24/2007   MYALGIA/MYOSITIS NOS 08/03/2006   Depression 06/23/2006   Past Medical History:  Diagnosis Date   Anemia    Anxiety    Arthritis    Cataracts, bilateral    MD just watching   Chronic lower back pain DUE TO MYOTONIC DYSTROPHY   Complication of anesthesia    slow to awaken , respirations either slow or fast.   Depression    patient denies this dx,  Therapist said that patient that she was fine after 2 years- ~2000   Endometriosis    Family history of adverse reaction to anesthesia 2022   brother was discharged after surgery had to be  taken  back to hospital  CO2 was elevated, he was in the hospital for 9 days.   GERD (gastroesophageal reflux disease)    diet controlled   History of right foot drop    Migraine    Myotonic dystrophy ([place]) DX JULY 2009  ----- TYPE 1   FOLLOWED BY DR. LOVE   Numbness of fingers BILATERAL HANDS/  OCCASIONLLY RADIATES UP ARMS   Pelvic pain in female    Pneumonia    Seasonal allergies    Sleep apnea    does not use cpap   Vertigo    no current problems in the last year   Vitamin D deficiency     Family History  Problem Relation Age of Onset   Diabetes Mother    Hypertension Father    Hyperlipidemia Father    Heart disease Father    Hypertension Brother    Diabetes Brother    Atrial fibrillation Brother    Heart disease Maternal Grandmother    Diabetes Maternal Grandfather    Heart disease Maternal Grandfather    Coronary artery disease Maternal Grandfather    Heart disease Paternal Grandmother    Stroke Paternal Grandmother    CAD Paternal Grandmother    Heart disease Paternal Grandfather    Cancer Paternal Uncle  Colon cancer   Parkinsonism Paternal Uncle    Coronary artery disease Maternal Uncle     Past Surgical History:  Procedure Laterality Date   BENIGN BREAST BX   FEB  2011   BREAST BIOPSY Right 2011   Benign Stereo    LAPAROSCOPY  05/01/2011   Procedure: LAPAROSCOPY DIAGNOSTIC;  Surgeon: Bennetta Laos, MD;  Location: [facility];  Service: Gynecology;  Laterality: N/A;  with bilateral chromopertubation, aspiration of functional left ovarian cyst, excision of two hydatid cysts   MUSCLE BIOPSY  02-25-2007   LEFT THIGH QUADRICEP BX X3  FOR MYOSITIS   OPEN REDUCTION INTERNAL FIXATION (ORIF) FOOT LISFRANC FRACTURE Left 09/05/2020   Procedure: OPEN REDUCTION INTERNAL FIXATION (ORIF) LEFT FOOT LISFRANC FRACTURE DISLOCATION;  Surgeon: [name], MD;  Location: [facility];  Service: Orthopedics;  Laterality: Left;   SURG. FOR REMOVAL OF TEETH FRAGMENTS     MARCH 2012   ORAL SURGEON OFFICE   WISDOM TOOTH EXTRACTION     Social History   Occupational History   Not on file  Tobacco Use   Smoking status: Former    Packs/day: 0.25    Years: 10.00    Pack years: 2.50    Types: Cigarettes    Quit date: 06/28/2016    Years since quitting: 4.2   Smokeless tobacco: Never   Tobacco comments:    varies, 0-0.5 pack per day  Vaping Use   Vaping Use: Former   Devices: 1 year none since 2019  Substance and Sexual Activity   Alcohol use: Not Currently    Alcohol/week: 0.0 standard drinks    Comment: rare   Drug use: No   Sexual activity: Not Currently    Birth control/protection: None

## 2020-09-14 ENCOUNTER — Other Ambulatory Visit: Payer: Self-pay | Admitting: Family

## 2020-09-14 ENCOUNTER — Telehealth: Payer: Self-pay | Admitting: Orthopedic Surgery

## 2020-09-14 MED ORDER — OXYCODONE-ACETAMINOPHEN 5-325 MG PO TABS: 1.0000 | ORAL_TABLET | Freq: Four times a day (QID) | ORAL | 0 refills | Status: DC | PRN

## 2020-09-14 NOTE — Telephone Encounter (Signed)
Patient called advised she checked with the pharmacy and the Rx for Percocet is not showing sent to the pharmacy yet.  Patient asked for a call when Rx is sent to the pharmacy. The number to contact patient is [phone]

## 2020-09-14 NOTE — Telephone Encounter (Signed)
Sent!

## 2020-09-14 NOTE — Telephone Encounter (Signed)
Called and sw pt to advise.

## 2020-09-18 ENCOUNTER — Encounter (HOSPITAL_COMMUNITY): Payer: Self-pay

## 2020-09-18 ENCOUNTER — Other Ambulatory Visit: Payer: Self-pay

## 2020-09-18 ENCOUNTER — Emergency Department (HOSPITAL_COMMUNITY)
Admission: EM | Admit: 2020-09-18 | Discharge: 2020-09-18 | Disposition: A | Discharge status: Home / Self Care | Payer: 59 | Attending: Emergency Medicine | Admitting: Emergency Medicine

## 2020-09-18 DIAGNOSIS — H9313 Tinnitus, bilateral: Secondary | ICD-10-CM | POA: Insufficient documentation

## 2020-09-18 DIAGNOSIS — Z5321 Procedure and treatment not carried out due to patient leaving prior to being seen by health care provider: Secondary | ICD-10-CM | POA: Diagnosis not present

## 2020-09-18 DIAGNOSIS — R251 Tremor, unspecified: Secondary | ICD-10-CM | POA: Insufficient documentation

## 2020-09-18 NOTE — ED Provider Notes (Signed)
Emergency Medicine Provider Triage Evaluation Note  Valerie Friedman , a 45 y.o. female  was evaluated in triage.  Pt complains of nervousness/tremors.  Patient states that she had surgery to her left foot about 11 days ago.  She was prescribed Zanaflex but was unsure of the dosing so she called the pharmacy and was told to take a half dose 3 times daily which she has been doing.  She states she has had intermittent episodes of nervousness/tremors that will spontaneously resolve.  Also reports tinnitus that began earlier today.  States her symptoms have been progressively improving throughout the day today.  Denies any fevers, chills, chest pain, shortness of breath, nausea, vomiting.  Physical Exam  BP (!) 137/93 (BP Location: Right Arm)   Pulse 96   Temp 98.8 F (37.1 C) (Oral)   Resp 16   Ht 5' 2.5" (1.588 m)   Wt 63.5 kg   LMP 08/24/2020 Comment: premenopausal- goes for a few months - no period and then she will have two periods in 1 month.  SpO2 93%   BMI 25.20 kg/m  Gen:   Awake, no distress   Resp:  Normal effort  MSK:   Moves extremities without difficulty  Other:    Medical Decision Making  Medically screening exam initiated at 5:06 PM.  Appropriate orders placed.  ELLISA FIERS was informed that the remainder of the evaluation will be completed by another provider, this initial triage assessment does not replace that evaluation, and the importance of remaining in the ED until their evaluation is complete.   Rayna Sexton, PA-C 09/18/20 1707    Hayden Rasmussen, MD 09/19/20 1026

## 2020-09-18 NOTE — ED Triage Notes (Signed)
Patient c/o shaking and ringing in her ears early this AM.  Patient states she began taking Zanaflex 2 days after her surgery. Patient states after one dose she was lethargic and then started taking 1/2 tab 3 times a day. Prescription states 4 mg daily. Patient states the pharmacist told her she could take this medication 1 tab tid ([facility])

## 2020-09-19 ENCOUNTER — Encounter: Payer: Self-pay | Admitting: Family

## 2020-09-19 ENCOUNTER — Ambulatory Visit (INDEPENDENT_AMBULATORY_CARE_PROVIDER_SITE_OTHER): Payer: 59 | Admitting: Family

## 2020-09-19 ENCOUNTER — Ambulatory Visit (INDEPENDENT_AMBULATORY_CARE_PROVIDER_SITE_OTHER): Payer: 59

## 2020-09-19 DIAGNOSIS — S93325S Dislocation of tarsometatarsal joint of left foot, sequela: Secondary | ICD-10-CM

## 2020-09-19 DIAGNOSIS — M79672 Pain in left foot: Secondary | ICD-10-CM

## 2020-09-19 MED ORDER — CYCLOBENZAPRINE HCL 10 MG PO TABS: 10.0000 mg | ORAL_TABLET | Freq: Three times a day (TID) | ORAL | 0 refills | Status: DC | PRN

## 2020-09-19 NOTE — Progress Notes (Signed)
Post-Op Visit Note   Patient: Valerie Friedman           Date of Birth: March 10, 1975           MRN: [id number] Visit Date: 09/19/2020 PCP: Laurey Morale, MD  Chief Complaint:  Chief Complaint  Patient presents with   Left Foot - Follow-up    HPI:  HPI The patient is a 45 year old woman seen status post lisfranc repair. Has a cam walker. Has been doing dry dressings daily.   Ortho Exam Incision is well healed. mild swelling. No erythema.   Visit Diagnoses:  1. Lisfranc dislocation, left, sequela     Plan: three proximal sutures harvested. Will hold off on remaining sutures at next follow up. She will continue with current wound care. Nonweight bearing as she does not tolerate the cam. She will follow up in 2 weeks.  Follow-Up Instructions: Return in about 2 weeks (around 10/03/2020).   Imaging: XR Foot Complete Left  Result Date: 09/19/2020 Radiographs of left foot show stable alignment of fixation hardware. No complicating feature.   Orders:  Orders Placed This Encounter  Procedures   XR Foot Complete Left   Meds ordered this encounter  Medications   cyclobenzaprine (FLEXERIL) 10 MG tablet    Sig: Take 1 tablet (10 mg total) by mouth 3 (three) times daily as needed for muscle spasms.    Dispense:  30 tablet    Refill:  0     PMFS History: Patient Active Problem List   Diagnosis Date Noted   Lisfranc dislocation, left, sequela    Vertigo 10/28/2017   GERD (gastroesophageal reflux disease) 06/15/2017   Circadian rhythm sleep disorder 04/30/2017   OSA (obstructive sleep apnea) 04/30/2017   Vitamin D deficiency 04/16/2016   Foot drop, right 03/26/2016   Anxiety    Myotonic dystrophy ([place])    Fibromyalgia    Migraine    WEIGHT LOSS 07/11/2008   HEADACHE 07/11/2008   NICOTINE ADDICTION 10/20/2007   MYOTONIC MUSCULAR DYSTROPHY 09/24/2007   MYALGIA/MYOSITIS NOS 08/03/2006   Depression 06/23/2006   Past Medical History:  Diagnosis Date   Anemia     Anxiety    Arthritis    Cataracts, bilateral    MD just watching   Chronic lower back pain DUE TO MYOTONIC DYSTROPHY   Complication of anesthesia    slow to awaken , respirations either slow or fast.   Depression    patient denies this dx,  Therapist said that patient that she was fine after 2 years- ~2000   Endometriosis    Family history of adverse reaction to anesthesia 2022   brother was discharged after surgery had to be  taken back to hospital  CO2 was elevated, he was in the hospital for 9 days.   GERD (gastroesophageal reflux disease)    diet controlled   History of right foot drop    Migraine    Myotonic dystrophy ([place]) DX JULY 2009  ----- TYPE 1   FOLLOWED BY DR. LOVE   Numbness of fingers BILATERAL HANDS/  OCCASIONLLY RADIATES UP ARMS   Pelvic pain in female    Pneumonia    Seasonal allergies    Sleep apnea    does not use cpap   Vertigo    no current problems in the last year   Vitamin D deficiency     Family History  Problem Relation Age of Onset   Diabetes Mother    Hypertension Father  Hyperlipidemia Father    Heart disease Father    Hypertension Brother    Diabetes Brother    Atrial fibrillation Brother    Heart disease Maternal Grandmother    Diabetes Maternal Grandfather    Heart disease Maternal Grandfather    Coronary artery disease Maternal Grandfather    Heart disease Paternal Grandmother    Stroke Paternal Grandmother    CAD Paternal Grandmother    Heart disease Paternal Grandfather    Cancer Paternal Uncle        Colon cancer   Parkinsonism Paternal Uncle    Coronary artery disease Maternal Uncle     Past Surgical History:  Procedure Laterality Date   BENIGN BREAST BX   FEB  2011   BREAST BIOPSY Right 2011   Benign Stereo    LAPAROSCOPY  05/01/2011   Procedure: LAPAROSCOPY DIAGNOSTIC;  Surgeon: Bennetta Laos, MD;  Location: [facility];  Service: Gynecology;  Laterality: N/A;  with bilateral chromopertubation,  aspiration of functional left ovarian cyst, excision of two hydatid cysts   MUSCLE BIOPSY  02-25-2007   LEFT THIGH QUADRICEP BX X3  FOR MYOSITIS   OPEN REDUCTION INTERNAL FIXATION (ORIF) FOOT LISFRANC FRACTURE Left 09/05/2020   Procedure: OPEN REDUCTION INTERNAL FIXATION (ORIF) LEFT FOOT LISFRANC FRACTURE DISLOCATION;  Surgeon: [name], MD;  Location: [place];  Service: Orthopedics;  Laterality: Left;   SURG. FOR REMOVAL OF TEETH FRAGMENTS    MARCH 2012   ORAL SURGEON OFFICE   WISDOM TOOTH EXTRACTION     Social History   Occupational History   Not on file  Tobacco Use   Smoking status: Former    Packs/day: 0.25    Years: 10.00    Pack years: 2.50    Types: Cigarettes    Quit date: 06/28/2016    Years since quitting: 4.2   Smokeless tobacco: Never   Tobacco comments:    varies, 0-0.5 pack per day  Vaping Use   Vaping Use: Former   Devices: 1 year none since 2019  Substance and Sexual Activity   Alcohol use: Not Currently    Alcohol/week: 0.0 standard drinks    Comment: rare   Drug use: No   Sexual activity: Not Currently    Birth control/protection: None

## 2020-09-19 NOTE — Progress Notes (Signed)
Post-Op Visit Note   Patient: Valerie Friedman           Date of Birth: 08-02-1975           MRN: [id number] Visit Date: 09/19/2020 PCP: Laurey Morale, MD  Chief Complaint:  Chief Complaint  Patient presents with   Left Foot - Follow-up    HPI:  HPI The patient is a 45 year old woman seen status post lisfranc repair. Has a cam walker. Has been doing dry dressings daily.   Ortho Exam Incision is well healed. mild swelling. No erythema.   Visit Diagnoses:  1. Lisfranc dislocation, left, sequela     Plan: three proximal sutures harvested. Will hold off on remaining sutures at next follow up. She will continue with current wound care. Nonweight bearing as she does not tolerate the cam. She will follow up in 2 weeks.  Follow-Up Instructions: Return in about 2 weeks (around 10/03/2020).   Imaging: XR Foot Complete Left  Result Date: 09/19/2020 Radiographs of left foot show stable alignment of fixation hardware. No complicating feature.   Orders:  Orders Placed This Encounter  Procedures   XR Foot Complete Left   Meds ordered this encounter  Medications   cyclobenzaprine (FLEXERIL) 10 MG tablet    Sig: Take 1 tablet (10 mg total) by mouth 3 (three) times daily as needed for muscle spasms.    Dispense:  30 tablet    Refill:  0     PMFS History: Patient Active Problem List   Diagnosis Date Noted   Lisfranc dislocation, left, sequela    Vertigo 10/28/2017   GERD (gastroesophageal reflux disease) 06/15/2017   Circadian rhythm sleep disorder 04/30/2017   OSA (obstructive sleep apnea) 04/30/2017   Vitamin D deficiency 04/16/2016   Foot drop, right 03/26/2016   Anxiety    Myotonic dystrophy ([place])    Fibromyalgia    Migraine    WEIGHT LOSS 07/11/2008   HEADACHE 07/11/2008   NICOTINE ADDICTION 10/20/2007   MYOTONIC MUSCULAR DYSTROPHY 09/24/2007   MYALGIA/MYOSITIS NOS 08/03/2006   Depression 06/23/2006   Past Medical History:  Diagnosis Date   Anemia     Anxiety    Arthritis    Cataracts, bilateral    MD just watching   Chronic lower back pain DUE TO MYOTONIC DYSTROPHY   Complication of anesthesia    slow to awaken , respirations either slow or fast.   Depression    patient denies this dx,  Therapist said that patient that she was fine after 2 years- ~2000   Endometriosis    Family history of adverse reaction to anesthesia 2022   brother was discharged after surgery had to be  taken back to hospital  CO2 was elevated, he was in the hospital for 9 days.   GERD (gastroesophageal reflux disease)    diet controlled   History of right foot drop    Migraine    Myotonic dystrophy ([place]) DX JULY 2009  ----- TYPE 1   FOLLOWED BY DR. LOVE   Numbness of fingers BILATERAL HANDS/  OCCASIONLLY RADIATES UP ARMS   Pelvic pain in female    Pneumonia    Seasonal allergies    Sleep apnea    does not use cpap   Vertigo    no current problems in the last year   Vitamin D deficiency     Family History  Problem Relation Age of Onset   Diabetes Mother    Hypertension Father  Hyperlipidemia Father    Heart disease Father    Hypertension Brother    Diabetes Brother    Atrial fibrillation Brother    Heart disease Maternal Grandmother    Diabetes Maternal Grandfather    Heart disease Maternal Grandfather    Coronary artery disease Maternal Grandfather    Heart disease Paternal Grandmother    Stroke Paternal Grandmother    CAD Paternal Grandmother    Heart disease Paternal Grandfather    Cancer Paternal Uncle        Colon cancer   Parkinsonism Paternal Uncle    Coronary artery disease Maternal Uncle     Past Surgical History:  Procedure Laterality Date   BENIGN BREAST BX   FEB  2011   BREAST BIOPSY Right 2011   Benign Stereo    LAPAROSCOPY  05/01/2011   Procedure: LAPAROSCOPY DIAGNOSTIC;  Surgeon: Bennetta Laos, MD;  Location: [facility];  Service: Gynecology;  Laterality: N/A;  with bilateral chromopertubation,  aspiration of functional left ovarian cyst, excision of two hydatid cysts   MUSCLE BIOPSY  02-25-2007   LEFT THIGH QUADRICEP BX X3  FOR MYOSITIS   OPEN REDUCTION INTERNAL FIXATION (ORIF) FOOT LISFRANC FRACTURE Left 09/05/2020   Procedure: OPEN REDUCTION INTERNAL FIXATION (ORIF) LEFT FOOT LISFRANC FRACTURE DISLOCATION;  Surgeon: [name], MD;  Location: [place];  Service: Orthopedics;  Laterality: Left;   SURG. FOR REMOVAL OF TEETH FRAGMENTS    MARCH 2012   ORAL SURGEON OFFICE   WISDOM TOOTH EXTRACTION     Social History   Occupational History   Not on file  Tobacco Use   Smoking status: Former    Packs/day: 0.25    Years: 10.00    Pack years: 2.50    Types: Cigarettes    Quit date: 06/28/2016    Years since quitting: 4.2   Smokeless tobacco: Never   Tobacco comments:    varies, 0-0.5 pack per day  Vaping Use   Vaping Use: Former   Devices: 1 year none since 2019  Substance and Sexual Activity   Alcohol use: Not Currently    Alcohol/week: 0.0 standard drinks    Comment: rare   Drug use: No   Sexual activity: Not Currently    Birth control/protection: None

## 2020-09-25 ENCOUNTER — Telehealth: Payer: Self-pay | Admitting: Orthopedic Surgery

## 2020-09-25 NOTE — Telephone Encounter (Signed)
Patient called. She would like percocet called in for her. Her call back number is [phone]

## 2020-09-26 ENCOUNTER — Telehealth: Payer: Self-pay | Admitting: Orthopedic Surgery

## 2020-09-26 MED ORDER — OXYCODONE-ACETAMINOPHEN 5-325 MG PO TABS: 1.0000 | ORAL_TABLET | Freq: Three times a day (TID) | ORAL | 0 refills | Status: DC | PRN

## 2020-09-26 NOTE — Telephone Encounter (Signed)
Patient called, she would like a refill on percocet. Her call back number is [phone]

## 2020-09-26 NOTE — Telephone Encounter (Signed)
Pt is s/p an ORIF lisfranc fx dislocation requesting refill on medication please see message below.

## 2020-09-29 ENCOUNTER — Other Ambulatory Visit: Payer: Self-pay | Admitting: Family

## 2020-10-03 ENCOUNTER — Other Ambulatory Visit: Payer: Self-pay | Admitting: Physician Assistant

## 2020-10-03 ENCOUNTER — Encounter: Payer: Self-pay | Admitting: Physician Assistant

## 2020-10-03 ENCOUNTER — Encounter: Payer: 59 | Admitting: Family

## 2020-10-03 ENCOUNTER — Other Ambulatory Visit: Payer: Self-pay

## 2020-10-03 ENCOUNTER — Ambulatory Visit: Payer: 59 | Admitting: Orthopedic Surgery

## 2020-10-03 ENCOUNTER — Ambulatory Visit (INDEPENDENT_AMBULATORY_CARE_PROVIDER_SITE_OTHER): Payer: 59

## 2020-10-03 DIAGNOSIS — S93325S Dislocation of tarsometatarsal joint of left foot, sequela: Secondary | ICD-10-CM

## 2020-10-03 DIAGNOSIS — M79672 Pain in left foot: Secondary | ICD-10-CM | POA: Diagnosis not present

## 2020-10-03 MED ORDER — OXYCODONE-ACETAMINOPHEN 5-325 MG PO TABS: 1.0000 | ORAL_TABLET | Freq: Three times a day (TID) | ORAL | 0 refills | Status: DC | PRN

## 2020-10-03 NOTE — Progress Notes (Signed)
Patient is seen 4 weeks status post open reduction internal fixation left foot Lisfranc fracture dislocation as well as metatarsal neck fractures.  Patient states she has been having some pain and swelling.  We will harvest the remaining sutures today patient was given instructions for range of motion of the toes and ankle and demonstrated this.  She will continue with the fracture boot she is given 1 last prescription for Percocet.  She will continue using ice.  Plan to follow-up in 2 weeks.

## 2020-10-05 ENCOUNTER — Telehealth: Payer: Self-pay | Admitting: Orthopedic Surgery

## 2020-10-05 NOTE — Telephone Encounter (Signed)
Pt asking if she can return the boot they sent to her. Pt states they originally had planned for surg of some kind but decided on another option and now she has received a boot that is $300 plus dollars that she can not use. Pt has called her ins and they can not help so she is unsure of what she can do to get the process started for returning it since she does not need it. The best call back number for the pt is [phone].

## 2020-10-10 NOTE — Telephone Encounter (Signed)
I called and sw pt's dad and advised I was unsure what boot she was talking about but that she could bring it with her to her appt next week and see what we can do for her.

## 2020-10-16 ENCOUNTER — Other Ambulatory Visit: Payer: Self-pay

## 2020-10-16 ENCOUNTER — Encounter: Payer: Self-pay | Admitting: Orthopedic Surgery

## 2020-10-16 ENCOUNTER — Ambulatory Visit (INDEPENDENT_AMBULATORY_CARE_PROVIDER_SITE_OTHER): Payer: 59 | Admitting: Orthopedic Surgery

## 2020-10-16 ENCOUNTER — Ambulatory Visit: Payer: Self-pay

## 2020-10-16 DIAGNOSIS — M79672 Pain in left foot: Secondary | ICD-10-CM | POA: Diagnosis not present

## 2020-10-16 DIAGNOSIS — S93325S Dislocation of tarsometatarsal joint of left foot, sequela: Secondary | ICD-10-CM | POA: Diagnosis not present

## 2020-10-16 NOTE — Progress Notes (Signed)
Office Visit Note   Patient: Valerie Friedman           Date of Birth: 04-09-1975           MRN: [id number] Visit Date: 10/16/2020              Requested by: Laurey Morale, MD [place],  [place] [address] PCP: Laurey Morale, MD  Chief Complaint  Patient presents with   Left Foot - Routine Post Op    09/05/20 ORIF left foot Lisfranc fx dislocation       HPI: Patient is a 45 year old woman who is 6 weeks status post open reduction internal fixation Lisfranc fracture dislocation she states the compression sock cut off her circulation so she got a much larger sock.  Patient states that she has difficulty wiggling her toes.  Assessment & Plan: Visit Diagnoses:  1. Lisfranc dislocation, left, sequela     Plan: Patient was given instructions and demonstrated scar massage and exercises.  She will use a washcloth on the skin apply ice for 10 to 15 minutes before exercise and then apply ice similarly after exercise and then resume her compression stocking with elevation.  Order placed for physical therapy as well.  Follow-Up Instructions: Return in about 3 weeks (around 11/06/2020).   Ortho Exam  Patient is alert, oriented, no adenopathy, well-dressed, normal affect, normal respiratory effort. Examination patient has dependent edema.  She does have improved range of motion of her toes but this is minimal.  She does have good passive range of motion of the toes but this is also painful she is developing adhesions from the scar tissue without moving the tendons.  Discussed the importance of moving the toes and tendons to break it up from the scar tissue.  Recommended using Voltaren gel for scar massage.  There is no signs of infection.  Imaging: XR Foot Complete Left  Result Date: 10/16/2020 Three-view radiographs of the left foot shows stable internal fixation without lucency without loss of reduction.  No images are attached to the encounter.  Labs: Lab Results   Component Value Date   HGBA1C 5.8 (H) 01/01/2017   HGBA1C 5.9 04/17/2006   ESRSEDRATE 40 (H) 06/22/2017   ESRSEDRATE 21 (H) 03/26/2016   CRP 0.5 06/22/2017   CRP 0.2 (L) 03/26/2016     Lab Results  Component Value Date   ALBUMIN 4.1 06/15/2017   ALBUMIN 4.2 06/18/2016   ALBUMIN 4.3 03/26/2016    Lab Results  Component Value Date   MG 2.3 06/22/2017   Lab Results  Component Value Date   VD25OH 23 (L) 04/21/2016   VD25OH 17.58 (L) 03/26/2016    No results found for: PREALBUMIN CBC EXTENDED Latest Ref Rng & Units 09/05/2020 06/22/2020 06/15/2017  WBC 4.0 - 10.5 K/uL 7.4 6.3 7.8  RBC 3.87 - 5.11 MIL/uL 4.81 4.96 4.32  HGB 12.0 - 15.0 g/dL 14.7 15.0 12.8  HCT 36.0 - 46.0 % 45.5 48.1(H) 39.3  PLT 150 - 400 K/uL 224 302 231.0  NEUTROABS 1.4 - 7.7 K/uL - - 5.5  LYMPHSABS 0.7 - 4.0 K/uL - - 1.7     There is no height or weight on file to calculate BMI.  Orders:  Orders Placed This Encounter  Procedures   XR Foot Complete Left   No orders of the defined types were placed in this encounter.    Procedures: No procedures performed  Clinical Data: No additional findings.  ROS:  All other systems negative, except as noted in the HPI. Review of Systems  Objective: Vital Signs: There were no vitals taken for this visit.  Specialty Comments:  No specialty comments available.  PMFS History: Patient Active Problem List   Diagnosis Date Noted   Lisfranc dislocation, left, sequela    Vertigo 10/28/2017   GERD (gastroesophageal reflux disease) 06/15/2017   Circadian rhythm sleep disorder 04/30/2017   OSA (obstructive sleep apnea) 04/30/2017   Vitamin D deficiency 04/16/2016   Foot drop, right 03/26/2016   Anxiety    Myotonic dystrophy ([place])    Fibromyalgia    Migraine    WEIGHT LOSS 07/11/2008   HEADACHE 07/11/2008   NICOTINE ADDICTION 10/20/2007   MYOTONIC MUSCULAR DYSTROPHY 09/24/2007   MYALGIA/MYOSITIS NOS 08/03/2006   Depression 06/23/2006   Past  Medical History:  Diagnosis Date   Anemia    Anxiety    Arthritis    Cataracts, bilateral    MD just watching   Chronic lower back pain DUE TO MYOTONIC DYSTROPHY   Complication of anesthesia    slow to awaken , respirations either slow or fast.   Depression    patient denies this dx,  Therapist said that patient that she was fine after 2 years- ~2000   Endometriosis    Family history of adverse reaction to anesthesia 2022   brother was discharged after surgery had to be  taken back to hospital  CO2 was elevated, he was in the hospital for 9 days.   GERD (gastroesophageal reflux disease)    diet controlled   History of right foot drop    Migraine    Myotonic dystrophy ([place]) DX JULY 2009  ----- TYPE 1   FOLLOWED BY DR. LOVE   Numbness of fingers BILATERAL HANDS/  OCCASIONLLY RADIATES UP ARMS   Pelvic pain in female    Pneumonia    Seasonal allergies    Sleep apnea    does not use cpap   Vertigo    no current problems in the last year   Vitamin D deficiency     Family History  Problem Relation Age of Onset   Diabetes Mother    Hypertension Father    Hyperlipidemia Father    Heart disease Father    Hypertension Brother    Diabetes Brother    Atrial fibrillation Brother    Heart disease Maternal Grandmother    Diabetes Maternal Grandfather    Heart disease Maternal Grandfather    Coronary artery disease Maternal Grandfather    Heart disease Paternal Grandmother    Stroke Paternal Grandmother    CAD Paternal Grandmother    Heart disease Paternal Grandfather    Cancer Paternal Uncle        Colon cancer   Parkinsonism Paternal Uncle    Coronary artery disease Maternal Uncle     Past Surgical History:  Procedure Laterality Date   BENIGN BREAST BX   FEB  2011   BREAST BIOPSY Right 2011   Benign Stereo    LAPAROSCOPY  05/01/2011   Procedure: LAPAROSCOPY DIAGNOSTIC;  Surgeon: Bennetta Laos, MD;  Location: [facility];  Service: Gynecology;   Laterality: N/A;  with bilateral chromopertubation, aspiration of functional left ovarian cyst, excision of two hydatid cysts   MUSCLE BIOPSY  02-25-2007   LEFT THIGH QUADRICEP BX X3  FOR MYOSITIS   OPEN REDUCTION INTERNAL FIXATION (ORIF) FOOT LISFRANC FRACTURE Left 09/05/2020   Procedure: OPEN REDUCTION INTERNAL FIXATION (ORIF) LEFT FOOT  LISFRANC FRACTURE DISLOCATION;  Surgeon: [name], MD;  Location: [facility];  Service: Orthopedics;  Laterality: Left;   SURG. FOR REMOVAL OF TEETH FRAGMENTS    MARCH 2012   ORAL SURGEON OFFICE   WISDOM TOOTH EXTRACTION     Social History   Occupational History   Not on file  Tobacco Use   Smoking status: Former    Packs/day: 0.25    Years: 10.00    Pack years: 2.50    Types: Cigarettes    Quit date: 06/28/2016    Years since quitting: 4.3   Smokeless tobacco: Never   Tobacco comments:    varies, 0-0.5 pack per day  Vaping Use   Vaping Use: Former   Devices: 1 year none since 2019  Substance and Sexual Activity   Alcohol use: Not Currently    Alcohol/week: 0.0 standard drinks    Comment: rare   Drug use: No   Sexual activity: Not Currently    Birth control/protection: None

## 2020-10-18 ENCOUNTER — Telehealth: Payer: Self-pay | Admitting: Orthopedic Surgery

## 2020-10-19 ENCOUNTER — Ambulatory Visit: Payer: 59 | Admitting: Rehabilitative and Restorative Service Providers"

## 2020-10-23 ENCOUNTER — Other Ambulatory Visit: Payer: Self-pay

## 2020-10-23 ENCOUNTER — Ambulatory Visit: Payer: Self-pay

## 2020-10-23 ENCOUNTER — Ambulatory Visit (INDEPENDENT_AMBULATORY_CARE_PROVIDER_SITE_OTHER): Payer: 59 | Admitting: Family

## 2020-10-23 ENCOUNTER — Ambulatory Visit: Payer: 59 | Attending: Orthopedic Surgery

## 2020-10-23 DIAGNOSIS — W19XXXA Unspecified fall, initial encounter: Secondary | ICD-10-CM

## 2020-10-23 DIAGNOSIS — M6281 Muscle weakness (generalized): Secondary | ICD-10-CM | POA: Diagnosis present

## 2020-10-23 DIAGNOSIS — S93325S Dislocation of tarsometatarsal joint of left foot, sequela: Secondary | ICD-10-CM

## 2020-10-23 DIAGNOSIS — Z9889 Other specified postprocedural states: Secondary | ICD-10-CM | POA: Diagnosis present

## 2020-10-23 DIAGNOSIS — R2689 Other abnormalities of gait and mobility: Secondary | ICD-10-CM | POA: Diagnosis present

## 2020-10-23 DIAGNOSIS — M25675 Stiffness of left foot, not elsewhere classified: Secondary | ICD-10-CM

## 2020-10-23 DIAGNOSIS — M25572 Pain in left ankle and joints of left foot: Secondary | ICD-10-CM

## 2020-10-23 DIAGNOSIS — M79672 Pain in left foot: Secondary | ICD-10-CM | POA: Diagnosis not present

## 2020-10-23 DIAGNOSIS — R262 Difficulty in walking, not elsewhere classified: Secondary | ICD-10-CM

## 2020-10-23 DIAGNOSIS — Z8781 Personal history of (healed) traumatic fracture: Secondary | ICD-10-CM | POA: Diagnosis present

## 2020-10-23 MED ORDER — OXYCODONE-ACETAMINOPHEN 5-325 MG PO TABS: 1.0000 | ORAL_TABLET | Freq: Three times a day (TID) | ORAL | 0 refills | Status: DC | PRN

## 2020-10-23 MED ORDER — CYCLOBENZAPRINE HCL 10 MG PO TABS: 10.0000 mg | ORAL_TABLET | Freq: Three times a day (TID) | ORAL | 0 refills | Status: DC | PRN

## 2020-10-23 NOTE — Progress Notes (Signed)
Post-Op Visit Note   Patient: Valerie Friedman           Date of Birth: 05-07-1975           MRN: [id number] Visit Date: 10/23/2020 PCP: Laurey Morale, MD  Chief Complaint: No chief complaint on file.   HPI:  HPI The patient is a 45 year old woman who is seen today for concern of pain and swelling to her great toe after having a fall.  She is status post recent Lisfranc repair on the left.  She fell out of bed sustaining an extension injury of the left great toe.  She has been having some dorsal pain and swelling since. Ortho Exam On examination she has some very mild edema and tenderness to the great toe.  Flexion extension of the great toe intact.  Visit Diagnoses:  1. Pain in left foot   2. Fall, initial encounter     Plan: Reassurance provided.  Left foot sprain.  She will continue working on scar massage.  Continue with physical therapy as previously ordered.  Follow-Up Instructions: No follow-ups on file.   Imaging: No results found.  Orders:  Orders Placed This Encounter  Procedures   XR Toe Great Left   No orders of the defined types were placed in this encounter.    PMFS History: Patient Active Problem List   Diagnosis Date Noted   Lisfranc dislocation, left, sequela    Vertigo 10/28/2017   GERD (gastroesophageal reflux disease) 06/15/2017   Circadian rhythm sleep disorder 04/30/2017   OSA (obstructive sleep apnea) 04/30/2017   Vitamin D deficiency 04/16/2016   Foot drop, right 03/26/2016   Anxiety    Myotonic dystrophy ([place])    Fibromyalgia    Migraine    WEIGHT LOSS 07/11/2008   HEADACHE 07/11/2008   NICOTINE ADDICTION 10/20/2007   MYOTONIC MUSCULAR DYSTROPHY 09/24/2007   MYALGIA/MYOSITIS NOS 08/03/2006   Depression 06/23/2006   Past Medical History:  Diagnosis Date   Anemia    Anxiety    Arthritis    Cataracts, bilateral    MD just watching   Chronic lower back pain DUE TO MYOTONIC DYSTROPHY   Complication of anesthesia    slow to  awaken , respirations either slow or fast.   Depression    patient denies this dx,  Therapist said that patient that she was fine after 2 years- ~2000   Endometriosis    Family history of adverse reaction to anesthesia 2022   brother was discharged after surgery had to be  taken back to hospital  CO2 was elevated, he was in the hospital for 9 days.   GERD (gastroesophageal reflux disease)    diet controlled   History of right foot drop    Migraine    Myotonic dystrophy ([place]) DX JULY 2009  ----- TYPE 1   FOLLOWED BY DR. LOVE   Numbness of fingers BILATERAL HANDS/  OCCASIONLLY RADIATES UP ARMS   Pelvic pain in female    Pneumonia    Seasonal allergies    Sleep apnea    does not use cpap   Vertigo    no current problems in the last year   Vitamin D deficiency     Family History  Problem Relation Age of Onset   Diabetes Mother    Hypertension Father    Hyperlipidemia Father    Heart disease Father    Hypertension Brother    Diabetes Brother    Atrial fibrillation Brother  Heart disease Maternal Grandmother    Diabetes Maternal Grandfather    Heart disease Maternal Grandfather    Coronary artery disease Maternal Grandfather    Heart disease Paternal Grandmother    Stroke Paternal Grandmother    CAD Paternal Grandmother    Heart disease Paternal Grandfather    Cancer Paternal Uncle        Colon cancer   Parkinsonism Paternal Uncle    Coronary artery disease Maternal Uncle     Past Surgical History:  Procedure Laterality Date   BENIGN BREAST BX   FEB  2011   BREAST BIOPSY Right 2011   Benign Stereo    LAPAROSCOPY  05/01/2011   Procedure: LAPAROSCOPY DIAGNOSTIC;  Surgeon: Bennetta Laos, MD;  Location: [facility];  Service: Gynecology;  Laterality: N/A;  with bilateral chromopertubation, aspiration of functional left ovarian cyst, excision of two hydatid cysts   MUSCLE BIOPSY  02-25-2007   LEFT THIGH QUADRICEP BX X3  FOR MYOSITIS   OPEN REDUCTION  INTERNAL FIXATION (ORIF) FOOT LISFRANC FRACTURE Left 09/05/2020   Procedure: OPEN REDUCTION INTERNAL FIXATION (ORIF) LEFT FOOT LISFRANC FRACTURE DISLOCATION;  Surgeon: [name], MD;  Location: [facility];  Service: Orthopedics;  Laterality: Left;   SURG. FOR REMOVAL OF TEETH FRAGMENTS    MARCH 2012   ORAL SURGEON OFFICE   WISDOM TOOTH EXTRACTION     Social History   Occupational History   Not on file  Tobacco Use   Smoking status: Former    Packs/day: 0.25    Years: 10.00    Pack years: 2.50    Types: Cigarettes    Quit date: 06/28/2016    Years since quitting: 4.3   Smokeless tobacco: Never   Tobacco comments:    varies, 0-0.5 pack per day  Vaping Use   Vaping Use: Former   Devices: 1 year none since 2019  Substance and Sexual Activity   Alcohol use: Not Currently    Alcohol/week: 0.0 standard drinks    Comment: rare   Drug use: No   Sexual activity: Not Currently    Birth control/protection: None

## 2020-10-24 NOTE — Therapy (Signed)
[place], [place], [address] Phone: [phone]   Fax:  [phone]  Physical Therapy Evaluation  Patient Details  Name: Valerie Friedman MRN: [id number] Date of Birth: [birth year] Referring Provider (PT): [name], MD   Encounter Date: 10/23/2020   PT End of Session - 10/24/20 0551     Visit Number 1    Number of Visits 17    Date for PT Re-Evaluation 12/29/20    Authorization Type [employer] DUAL COMPLETE; Medicaid [place] Access    PT Start Time 0130    PT Stop Time 0230    PT Time Calculation (min) 60 min    Activity Tolerance Patient tolerated treatment well    Behavior During Therapy [facility] for tasks assessed/performed             Past Medical History:  Diagnosis Date   Anemia    Anxiety    Arthritis    Cataracts, bilateral    MD just watching   Chronic lower back pain DUE TO MYOTONIC DYSTROPHY   Complication of anesthesia    slow to awaken , respirations either slow or fast.   Depression    patient denies this dx,  Therapist said that patient that she was fine after 2 years- ~2000   Endometriosis    Family history of adverse reaction to anesthesia 2022   brother was discharged after surgery had to be  taken back to hospital  CO2 was elevated, he was in the hospital for 9 days.   GERD (gastroesophageal reflux disease)    diet controlled   History of right foot drop    Migraine    Myotonic dystrophy ([place]) DX JULY 2009  ----- TYPE 1   FOLLOWED BY DR. LOVE   Numbness of fingers BILATERAL HANDS/  OCCASIONLLY RADIATES UP ARMS   Pelvic pain in female    Pneumonia    Seasonal allergies    Sleep apnea    does not use cpap   Vertigo    no current problems in the last year   Vitamin D deficiency     Past Surgical History:  Procedure Laterality Date   BENIGN BREAST BX   FEB  2011   BREAST BIOPSY Right 2011   Benign Stereo    LAPAROSCOPY  05/01/2011   Procedure:  LAPAROSCOPY DIAGNOSTIC;  Surgeon: Bennetta Laos, MD;  Location: [facility];  Service: Gynecology;  Laterality: N/A;  with bilateral chromopertubation, aspiration of functional left ovarian cyst, excision of two hydatid cysts   MUSCLE BIOPSY  02-25-2007   LEFT THIGH QUADRICEP BX X3  FOR MYOSITIS   OPEN REDUCTION INTERNAL FIXATION (ORIF) FOOT LISFRANC FRACTURE Left 09/05/2020   Procedure: OPEN REDUCTION INTERNAL FIXATION (ORIF) LEFT FOOT LISFRANC FRACTURE DISLOCATION;  Surgeon: [name], MD;  Location: [facility];  Service: Orthopedics;  Laterality: Left;   SURG. FOR REMOVAL OF TEETH FRAGMENTS    MARCH 2012   ORAL SURGEON OFFICE   WISDOM TOOTH EXTRACTION      There were no vitals filed for this visit.    Subjective Assessment - 10/23/20 1337     Subjective Pt reports she injuried her L foot when she stepped in a hole in the ground on 06/09/20. Pt states she underwent surgery on 09/05/20 surgery. Pt notes she is to limit the time up on her feet and spends most of her day in bed. Pt reports on 10/21/20 she fell OOB and injured  the distal L great toe experiencing increased pain. Pt reports x-rays earlier today were negative for a fx. Pt notes she has myotonic dystrophy.    Pertinent History ORIF s/p Lisfranc dislocation; Myotonic dystrophy; L GT injury s/p fall with falling OOB 10/9; anxiety    Limitations Standing;Walking;House hold activities    Diagnostic tests X-ray 10/23/20:Graphs of the left great toe are negative.  No sign of fracture. X-ray 10/16/20:Three-view radiographs of the left foot shows stable internal fixation   without lucency without loss of reduction.    Patient Stated Goals "To walk without pain"    Currently in Pain? Yes    Pain Score 9     Pain Location Foot    Pain Orientation Left    Pain Descriptors / Indicators Aching;Sharp;Throbbing    Pain Type Surgical pain    Pain Onset More than a month ago    Pain Frequency Constant    Aggravating Factors   weight bearing    Pain Relieving Factors RICE, medications                [facility] PT Assessment - 10/24/20 0001       Assessment   Medical Diagnosis Lisfranc dislocation, left, sequela; ORIF    Referring Provider (PT) [name], MD    Onset Date/Surgical Date 09/05/20    Hand Dominance Right    Next MD Visit 10/25    Prior Therapy No      Precautions   Precautions --   Pt reports she is to limittime on her feet per Dr. Sharol Given and stays in bed most of the time.   Precaution Comments Range of motion and strengthening of the toes and ankle left foot status post ORIF of the Lisfranc complex      Restrictions   Weight Bearing Restrictions No      Balance Screen   Has the patient fallen in the past 6 months No    Has the patient had a decrease in activity level because of a fear of falling?  No    Is the patient reluctant to leave their home because of a fear of falling?  No      Home Ecologist residence    Transport planner;Other relatives    Type of [place] to enter    Entrance Stairs-Number of Steps 7    Entrance Stairs-Rails Right    Home Layout One level    Sebastian - 2 wheels   recommended a SPC for pain relief     Prior Function   Level of Independence Independent      Cognition   Overall Cognitive Status Within Functional Limits for tasks assessed      Observation/Other Assessments   Observations Dorsum of the R foot is somwagt reddened with a healed incision    Focus on Therapeutic Outcomes (FOTO)  TBA assessed the next PT session      Observation/Other Assessments-Edema    Edema Circumferential   1st and 5th met heads. L 21.0 cm; R 19.5 cm     Sensation   Light Touch Appears Intact      Posture/Postural Control   Posture Comments Arches bilat demonstrate normal posture      ROM / Strength   AROM / PROM / Strength AROM;PROM;Strength      AROM   Overall AROM Comments  Marked decreased active 1-5 toe flexion, 3rd more so  than others    AROM Assessment Site Ankle    Right/Left Ankle Right;Left    Right Ankle Dorsiflexion -6    Right Ankle Inversion 54    Right Ankle Eversion 34    Left Ankle Dorsiflexion -6    Left Ankle Inversion 46    Left Ankle Eversion 34      PROM   Overall PROM Comments Moderate decreased L 1-5 toe flexion, 3rd toe more so than others      Strength   Strength Assessment Site Ankle    Right/Left Ankle Right;Left    Right Ankle Dorsiflexion 5/5    Right Ankle Plantar Flexion 5/5    Right Ankle Inversion 5/5    Right Ankle Eversion 5/5    Left Ankle Dorsiflexion 4+/5    Left Ankle Plantar Flexion 5/5    Left Ankle Inversion 5/5    Left Ankle Eversion 4+/5      Palpation   Palpation comment TTP with light touch of the 1st ray from mid-foot to DP.      Transfers   Transfers Sit to Stand;Stand to Sit    Sit to Stand 6: Modified independent (Device/Increase time)      Ambulation/Gait   Gait Pattern Antalgic   decreased pace; decreased step length, but equal; lateral border of L foot wt bearing, early heel off bilat                       Objective measurements completed on examination: See above findings.                PT Education - 10/24/20 0548     Education Details Eval findings, POC, HEP mobility of L ankle and toes, use of a SPC to decreased wt. bearing L foot    Person(s) Educated Patient    Methods Explanation;Demonstration;Tactile cues;Verbal cues;Handout    Comprehension Verbalized understanding;Returned demonstration;Verbal cues required;Tactile cues required              PT Short Term Goals - 10/24/20 0615       PT SHORT TERM GOAL #1   Title Pt will be Ind with and initial HEP    Baseline started on eval    Status New    Target Date 11/14/20      PT SHORT TERM GOAL #2   Title Pt will voice understanding of measures to decrease L foot pain    Status New    Target  Date 11/14/20               PT Long Term Goals - 10/24/20 0617       PT LONG TERM GOAL #1   Title Pt will be Ind in a final HEP to maintain achieved LOF    Status New    Target Date 12/29/20      PT LONG TERM GOAL #2   Title Increase biat ankle DF ROM to 0d (neutral) forimproved gait quality and to decrease strain of the L mid foot    Status New    Target Date 12/29/20      PT LONG TERM GOAL #3   Title Increase L toe flexion ROM to 75% of the R for improved L foot function with pushing off and balance    Status New    Target Date 12/29/20      PT LONG TERM GOAL #4   Title Increase L foot DF and Evervion strength to 5/5 for improved  L foot function and balance with gait    Status New    Target Date 12/29/20      PT LONG TERM GOAL #5   Title Pt will report a decrease in L foot pain to 4/10 or less for improved functional mobility and QOL    Baseline 9/10    Status New    Target Date 12/29/20                    Plan - 10/24/20 0550     Clinical Impression Statement Pt presents to PT following an ORIF on 09/05/20 for a Lisfranc Dislocation. On 10/21/20, pt fell OOB injuring the dital GT. X-ray today was negative for a Fx. Pt walks c and antalgic gait c wt bearing on the lateral border of the L foot, AROM and PROM of all the toes is decreased c the 3rd toe more so than the others, the L mid foot is swollen, and both ankles demonstrate decreased DF due to heel cord tightness. The strength of the L ankle was min decrease for for DF and eversion in comparison to the R. See intructions and education for today's interventions. Pt will benefit from skilled PT 2w8 to address the above deficits and to optimize pt's function of her L foot and QOL.    Personal Factors and Comorbidities Comorbidity 3+    Comorbidities ORIF s/p Lisfranc dislocation; Myotonic dystrophy; L GT injury s/p fall with falling OOB 10/9; anxiety    Examination-Activity Limitations Locomotion  Level;Transfers;Stand    Clinical Decision Making Moderate    Rehab Potential Good    PT Frequency 2x / week    PT Duration 8 weeks    PT Treatment/Interventions ADLs/Self Care Home Management;Iontophoresis 59m/ml Dexamethasone;Moist Heat;Ultrasound;Balance training;Therapeutic exercise;Therapeutic activities;Functional mobility training;Stair training;Gait training;Patient/family education;Manual techniques;Dry needling;Taping;Vasopneumatic Device    PT Next Visit Plan Complete FOTO, assess 2MWT and TUG and set goals    PT Home Exercise Plan [id number]    Consulted and Agree with Plan of Care Patient             Patient will benefit from skilled therapeutic intervention in order to improve the following deficits and impairments:  Abnormal gait, Difficulty walking, Decreased range of motion, Pain, Decreased activity tolerance, Decreased mobility, Decreased strength  Visit Diagnosis: Lisfranc's dislocation, left, sequela  S/P ORIF (open reduction internal fixation) fracture  Pain in left ankle and joints of left foot  Decreased ROM of left foot  Muscle weakness (generalized)  Difficulty in walking, not elsewhere classified  Other abnormalities of gait and mobility     Problem List Patient Active Problem List   Diagnosis Date Noted   Lisfranc dislocation, left, sequela    Vertigo 10/28/2017   GERD (gastroesophageal reflux disease) 06/15/2017   Circadian rhythm sleep disorder 04/30/2017   OSA (obstructive sleep apnea) 04/30/2017   Vitamin D deficiency 04/16/2016   Foot drop, right 03/26/2016   Anxiety    [place]    Fibromyalgia    Migraine    WEIGHT LOSS 07/11/2008   HEADACHE 07/11/2008   NICOTINE ADDICTION 10/20/2007   MYOTONIC MUSCULAR DYSTROPHY 09/24/2007   MYALGIA/MYOSITIS NOS 08/03/2006   Depression 06/23/2006    AGar PontoMS, PT 10/24/20 9:44 AM   [facility] [address]Phone: [phone]  Fax:  [phone] Name: IALEJANDRA HUNTMRN: [id number] of Birth: [birth year]

## 2020-11-01 ENCOUNTER — Other Ambulatory Visit: Payer: Self-pay

## 2020-11-01 ENCOUNTER — Ambulatory Visit: Payer: 59

## 2020-11-01 DIAGNOSIS — M25675 Stiffness of left foot, not elsewhere classified: Secondary | ICD-10-CM

## 2020-11-01 DIAGNOSIS — Z9889 Other specified postprocedural states: Secondary | ICD-10-CM

## 2020-11-01 DIAGNOSIS — R262 Difficulty in walking, not elsewhere classified: Secondary | ICD-10-CM

## 2020-11-01 DIAGNOSIS — M6281 Muscle weakness (generalized): Secondary | ICD-10-CM

## 2020-11-01 DIAGNOSIS — Z8781 Personal history of (healed) traumatic fracture: Secondary | ICD-10-CM

## 2020-11-01 DIAGNOSIS — S93325S Dislocation of tarsometatarsal joint of left foot, sequela: Secondary | ICD-10-CM | POA: Diagnosis not present

## 2020-11-01 DIAGNOSIS — M25572 Pain in left ankle and joints of left foot: Secondary | ICD-10-CM

## 2020-11-01 DIAGNOSIS — R2689 Other abnormalities of gait and mobility: Secondary | ICD-10-CM

## 2020-11-01 NOTE — Therapy (Signed)
[place], [place], [address] Phone: [phone]   Fax:  [phone]  Physical Therapy Treatment  Patient Details  Name: Valerie Friedman MRN: [id number] Date of Birth: 01/31/1975 Referring Provider (PT): [name], MD   Encounter Date: 11/01/2020   PT End of Session - 11/01/20 1531     Visit Number 2    Number of Visits 17    Date for PT Re-Evaluation 12/29/20    Authorization Type UNITEDHE[employer] DUAL COMPLETE; Medicaid [place] Access    PT Start Time 4097    PT Stop Time 1613    PT Time Calculation (min) 43 min    Activity Tolerance Patient tolerated treatment well    Behavior During Therapy [facility] for tasks assessed/performed             Past Medical History:  Diagnosis Date   Anemia    Anxiety    Arthritis    Cataracts, bilateral    MD just watching   Chronic lower back pain DUE TO MYOTONIC DYSTROPHY   Complication of anesthesia    slow to awaken , respirations either slow or fast.   Depression    patient denies this dx,  Therapist said that patient that she was fine after 2 years- ~2000   Endometriosis    Family history of adverse reaction to anesthesia 2022   brother was discharged after surgery had to be  taken back to hospital  CO2 was elevated, he was in the hospital for 9 days.   GERD (gastroesophageal reflux disease)    diet controlled   History of right foot drop    Migraine    Myotonic dystrophy ([place]) DX JULY 2009  ----- TYPE 1   FOLLOWED BY DR. LOVE   Numbness of fingers BILATERAL HANDS/  OCCASIONLLY RADIATES UP ARMS   Pelvic pain in female    Pneumonia    Seasonal allergies    Sleep apnea    does not use cpap   Vertigo    no current problems in the last year   Vitamin D deficiency     Past Surgical History:  Procedure Laterality Date   BENIGN BREAST BX   FEB  2011   BREAST BIOPSY Right 2011   Benign Stereo    LAPAROSCOPY  05/01/2011   Procedure:  LAPAROSCOPY DIAGNOSTIC;  Surgeon: Bennetta Laos, MD;  Location: [facility];  Service: Gynecology;  Laterality: N/A;  with bilateral chromopertubation, aspiration of functional left ovarian cyst, excision of two hydatid cysts   MUSCLE BIOPSY  02-25-2007   LEFT THIGH QUADRICEP BX X3  FOR MYOSITIS   OPEN REDUCTION INTERNAL FIXATION (ORIF) FOOT LISFRANC FRACTURE Left 09/05/2020   Procedure: OPEN REDUCTION INTERNAL FIXATION (ORIF) LEFT FOOT LISFRANC FRACTURE DISLOCATION;  Surgeon: [name], MD;  Location: [place];  Service: Orthopedics;  Laterality: Left;   SURG. FOR REMOVAL OF TEETH FRAGMENTS    MARCH 2012   ORAL SURGEON OFFICE   WISDOM TOOTH EXTRACTION      There were no vitals filed for this visit.   Subjective Assessment - 11/01/20 1535     Subjective Patient reports the top of the foot and the toe are hurting right now because she is wearing a shoe and a sock. She reports intermittent compliance with HEP.    Currently in Pain? Yes    Pain Score 4     Pain Location Foot    Pain Orientation Left  Pain Descriptors / Indicators Aching    Pain Type Surgical pain    Pain Onset More than a month ago    Pain Frequency Intermittent    Aggravating Factors  stairs    Pain Relieving Factors rest                [facility] PT Assessment - 11/01/20 0001       Observation/Other Assessments   Focus on Therapeutic Outcomes (FOTO)  40% to 63%      Ambulation/Gait   Gait Comments 2 MWT 333 ft, no rest breaks      Balance   Balance Assessed Yes      Standardized Balance Assessment   Standardized Balance Assessment Timed Up and Go Test      Timed Up and Go Test   Normal TUG (seconds) 10.7                   [facility] Adult PT Treatment/Exercise:  Therapeutic Exercise: - 2MWT -TUG -Ankle rockerboard fwd/bwd and lateral 2 x 10  -Calf stretch at wall 1 x 30 sec each  - Towel scrunches 2 x 10  - Calf stretch on wedge attempted, pain   Manual Therapy: -  N/a  Neuromuscular re-ed: - n/a  Therapeutic Activity: - n/a  Self-care/Home Management: - Education on FOTO score and anticipated progress.  - Modalities for pain control.                      PT Short Term Goals - 10/24/20 0615       PT SHORT TERM GOAL #1   Title Pt will be Ind with and initial HEP    Baseline started on eval    Status New    Target Date 11/14/20      PT SHORT TERM GOAL #2   Title Pt will voice understanding of measures to decrease L foot pain    Status New    Target Date 11/14/20               PT Long Term Goals - 11/01/20 1556       PT LONG TERM GOAL #1   Title Pt will be Ind in a final HEP to maintain achieved LOF    Status New    Target Date 12/29/20      PT LONG TERM GOAL #2   Title Increase biat ankle DF ROM to 0d (neutral) forimproved gait quality and to decrease strain of the L mid foot    Status New    Target Date 12/29/20      PT LONG TERM GOAL #3   Title Increase L toe flexion ROM to 75% of the R for improved L foot function with pushing off and balance    Status New    Target Date 12/29/20      PT LONG TERM GOAL #4   Title Increase L foot DF and Evervion strength to 5/5 for improved L foot function and balance with gait    Status New    Target Date 12/29/20      PT LONG TERM GOAL #5   Title Pt will report a decrease in L foot pain to 4/10 or less for improved functional mobility and QOL    Baseline 9/10    Status New    Target Date 12/29/20      Additional Long Term Goals   Additional Long Term Goals Yes      PT  LONG TERM GOAL #6   Title Patient will ambulate at least 400 ft durign 2MWT test to signify improvements in gait speed.    Baseline 333 ft    Status New    Target Date 12/29/20                   Plan - 11/01/20 1532     Clinical Impression Statement FOTO score captured with patient scoring at 40% function with 63% predicted. TUG was completed with patient completing in 10  seconds, so no functional goal was set for this. She was able to ambulate 333 ft during 2MWT. Ther ex focused on ankle and toe mobility, which she tolerated well with the exception of attempting calf stretch on wedge as this caused great toe pain.    PT Treatment/Interventions ADLs/Self Care Home Management;Iontophoresis 4mg /ml Dexamethasone;Moist Heat;Ultrasound;Balance training;Therapeutic exercise;Therapeutic activities;Functional mobility training;Stair training;Gait training;Patient/family education;Manual techniques;Dry needling;Taping;Vasopneumatic Device    PT Next Visit Plan gait training, ankle strengthening/ROM    PT Home Exercise Plan [id number]    Consulted and Agree with Plan of Care Patient             Patient will benefit from skilled therapeutic intervention in order to improve the following deficits and impairments:  Abnormal gait, Difficulty walking, Decreased range of motion, Pain, Decreased activity tolerance, Decreased mobility, Decreased strength  Visit Diagnosis: Lisfranc's dislocation, left, sequela  S/P ORIF (open reduction internal fixation) fracture  Pain in left ankle and joints of left foot  Decreased ROM of left foot  Muscle weakness (generalized)  Difficulty in walking, not elsewhere classified  Other abnormalities of gait and mobility     Problem List Patient Active Problem List   Diagnosis Date Noted   Lisfranc dislocation, left, sequela    Vertigo 10/28/2017   GERD (gastroesophageal reflux disease) 06/15/2017   Circadian rhythm sleep disorder 04/30/2017   OSA (obstructive sleep apnea) 04/30/2017   Vitamin D deficiency 04/16/2016   Foot drop, right 03/26/2016   Anxiety    Myotonic dystrophy ([place])    Fibromyalgia    Migraine    WEIGHT LOSS 07/11/2008   HEADACHE 07/11/2008   NICOTINE ADDICTION 10/20/2007   MYOTONIC MUSCULAR DYSTROPHY 09/24/2007   MYALGIA/MYOSITIS NOS 08/03/2006   Depression 06/23/2006   Gwendolyn Grant, PT, DPT,  ATC 11/01/20 4:34 PM   [facility] [address] [place], [place], [address] Phone: [phone]   Fax:  [phone]  Name: Valerie Friedman MRN: [id number] Date of Birth: 1976-01-03

## 2020-11-02 ENCOUNTER — Encounter (HOSPITAL_COMMUNITY): Payer: Self-pay | Admitting: Orthopedic Surgery

## 2020-11-05 ENCOUNTER — Telehealth: Payer: Self-pay

## 2020-11-05 ENCOUNTER — Ambulatory Visit: Payer: 59

## 2020-11-05 NOTE — Telephone Encounter (Signed)
Spoke with patient regarding missed PT appointment. Patient stated she did not know any appointments were scheduled on Mondays because she needed appointments on Tuesday/Thursdays. Asked to cancel all future Monday visits and will reschedule to Tuesday when she comes for her next visit this Thursday. Reviewed attendance policy.

## 2020-11-06 ENCOUNTER — Ambulatory Visit (INDEPENDENT_AMBULATORY_CARE_PROVIDER_SITE_OTHER): Payer: 59 | Admitting: Orthopedic Surgery

## 2020-11-06 ENCOUNTER — Encounter: Payer: Self-pay | Admitting: Orthopedic Surgery

## 2020-11-06 DIAGNOSIS — W19XXXA Unspecified fall, initial encounter: Secondary | ICD-10-CM

## 2020-11-06 DIAGNOSIS — M79672 Pain in left foot: Secondary | ICD-10-CM

## 2020-11-06 DIAGNOSIS — S93325S Dislocation of tarsometatarsal joint of left foot, sequela: Secondary | ICD-10-CM

## 2020-11-06 DIAGNOSIS — M2022 Hallux rigidus, left foot: Secondary | ICD-10-CM

## 2020-11-06 NOTE — Progress Notes (Signed)
Office Visit Note   Patient: Valerie Friedman           Date of Birth: June 25, 1975           MRN: [id number] Visit Date: 11/06/2020              Requested by: Laurey Morale, MD [place],  [place] [address] PCP: Laurey Morale, MD  Chief Complaint  Patient presents with   Left Foot - Follow-up      HPI: Patient is a 45 year old woman who is status post Lisfranc dislocation internal fixation who recently had a fall and sustained a hyperflexion to the great toe MTP joint.  Patient still has swelling and pain around the great toe MTP joint.  Assessment & Plan: Visit Diagnoses:  1. Fall, initial encounter   2. Lisfranc dislocation, left, sequela   3. Hallux rigidus, left foot     Plan: Patient will continue working on Achilles stretching recommended a stiff soled shoe to unload the MTP joint versus a carbon plate to go under her orthotic.  Discussed that she can use Voltaren gel.  Discussed that if she fails conservative treatment a cheilectomy or fusion are options.  Continue with scar massage.  Follow-Up Instructions: Return if symptoms worsen or fail to improve.   Ortho Exam  Patient is alert, oriented, no adenopathy, well-dressed, normal affect, normal respiratory effort. Examination patient does have bony spurs dorsally over the MTP joint of great toe which are tender to palpation she has dorsiflexion of 30 degrees of the MTP joint.  She has some mild keloiding of her scar and scar massage was recommended.  Imaging: No results found. No images are attached to the encounter.  Labs: Lab Results  Component Value Date   HGBA1C 5.8 (H) 01/01/2017   HGBA1C 5.9 04/17/2006   ESRSEDRATE 40 (H) 06/22/2017   ESRSEDRATE 21 (H) 03/26/2016   CRP 0.5 06/22/2017   CRP 0.2 (L) 03/26/2016     Lab Results  Component Value Date   ALBUMIN 4.1 06/15/2017   ALBUMIN 4.2 06/18/2016   ALBUMIN 4.3 03/26/2016    Lab Results  Component Value Date   MG 2.3  06/22/2017   Lab Results  Component Value Date   VD25OH 23 (L) 04/21/2016   VD25OH 17.58 (L) 03/26/2016    No results found for: PREALBUMIN CBC EXTENDED Latest Ref Rng & Units 09/05/2020 06/22/2020 06/15/2017  WBC 4.0 - 10.5 K/uL 7.4 6.3 7.8  RBC 3.87 - 5.11 MIL/uL 4.81 4.96 4.32  HGB 12.0 - 15.0 g/dL 14.7 15.0 12.8  HCT 36.0 - 46.0 % 45.5 48.1(H) 39.3  PLT 150 - 400 K/uL 224 302 231.0  NEUTROABS 1.4 - 7.7 K/uL - - 5.5  LYMPHSABS 0.7 - 4.0 K/uL - - 1.7     There is no height or weight on file to calculate BMI.  Orders:  No orders of the defined types were placed in this encounter.  No orders of the defined types were placed in this encounter.    Procedures: No procedures performed  Clinical Data: No additional findings.  ROS:  All other systems negative, except as noted in the HPI. Review of Systems  Objective: Vital Signs: There were no vitals taken for this visit.  Specialty Comments:  No specialty comments available.  PMFS History: Patient Active Problem List   Diagnosis Date Noted   Lisfranc dislocation, left, sequela    Vertigo 10/28/2017   GERD (gastroesophageal reflux disease) 06/15/2017  Circadian rhythm sleep disorder 04/30/2017   OSA (obstructive sleep apnea) 04/30/2017   Vitamin D deficiency 04/16/2016   Foot drop, right 03/26/2016   Anxiety    Myotonic dystrophy ([place])    Fibromyalgia    Migraine    WEIGHT LOSS 07/11/2008   HEADACHE 07/11/2008   NICOTINE ADDICTION 10/20/2007   MYOTONIC MUSCULAR DYSTROPHY 09/24/2007   MYALGIA/MYOSITIS NOS 08/03/2006   Depression 06/23/2006   Past Medical History:  Diagnosis Date   Anemia    Anxiety    Arthritis    Cataracts, bilateral    MD just watching   Chronic lower back pain DUE TO MYOTONIC DYSTROPHY   Complication of anesthesia    slow to awaken , respirations either slow or fast.   Depression    patient denies this dx,  Therapist said that patient that she was fine after 2 years- ~2000    Endometriosis    Family history of adverse reaction to anesthesia 2022   brother was discharged after surgery had to be  taken back to hospital  CO2 was elevated, he was in the hospital for 9 days.   GERD (gastroesophageal reflux disease)    diet controlled   History of right foot drop    Migraine    Myotonic dystrophy ([place]) DX JULY 2009  ----- TYPE 1   FOLLOWED BY DR. LOVE   Numbness of fingers BILATERAL HANDS/  OCCASIONLLY RADIATES UP ARMS   Pelvic pain in female    Pneumonia    Seasonal allergies    Sleep apnea    does not use cpap   Vertigo    no current problems in the last year   Vitamin D deficiency     Family History  Problem Relation Age of Onset   Diabetes Mother    Hypertension Father    Hyperlipidemia Father    Heart disease Father    Hypertension Brother    Diabetes Brother    Atrial fibrillation Brother    Heart disease Maternal Grandmother    Diabetes Maternal Grandfather    Heart disease Maternal Grandfather    Coronary artery disease Maternal Grandfather    Heart disease Paternal Grandmother    Stroke Paternal Grandmother    CAD Paternal Grandmother    Heart disease Paternal Grandfather    Cancer Paternal Uncle        Colon cancer   Parkinsonism Paternal Uncle    Coronary artery disease Maternal Uncle     Past Surgical History:  Procedure Laterality Date   BENIGN BREAST BX   FEB  2011   BREAST BIOPSY Right 2011   Benign Stereo    LAPAROSCOPY  05/01/2011   Procedure: LAPAROSCOPY DIAGNOSTIC;  Surgeon: Bennetta Laos, MD;  Location: [place];  Service: Gynecology;  Laterality: N/A;  with bilateral chromopertubation, aspiration of functional left ovarian cyst, excision of two hydatid cysts   MUSCLE BIOPSY  02-25-2007   LEFT THIGH QUADRICEP BX X3  FOR MYOSITIS   OPEN REDUCTION INTERNAL FIXATION (ORIF) FOOT LISFRANC FRACTURE Left 09/05/2020   Procedure: OPEN REDUCTION INTERNAL FIXATION (ORIF) LEFT FOOT LISFRANC FRACTURE DISLOCATION;   Surgeon: [name], MD;  Location: [place];  Service: Orthopedics;  Laterality: Left;   SURG. FOR REMOVAL OF TEETH FRAGMENTS    MARCH 2012   ORAL SURGEON OFFICE   WISDOM TOOTH EXTRACTION     Social History   Occupational History   Not on file  Tobacco Use   Smoking status: Former  Packs/day: 0.25    Years: 10.00    Pack years: 2.50    Types: Cigarettes    Quit date: 06/28/2016    Years since quitting: 4.3   Smokeless tobacco: Never   Tobacco comments:    varies, 0-0.5 pack per day  Vaping Use   Vaping Use: Former   Devices: 1 year none since 2019  Substance and Sexual Activity   Alcohol use: Not Currently    Alcohol/week: 0.0 standard drinks    Comment: rare   Drug use: No   Sexual activity: Not Currently    Birth control/protection: None

## 2020-11-08 ENCOUNTER — Other Ambulatory Visit: Payer: Self-pay

## 2020-11-08 ENCOUNTER — Ambulatory Visit: Payer: 59

## 2020-11-08 DIAGNOSIS — M25572 Pain in left ankle and joints of left foot: Secondary | ICD-10-CM

## 2020-11-08 DIAGNOSIS — M6281 Muscle weakness (generalized): Secondary | ICD-10-CM

## 2020-11-08 DIAGNOSIS — S93325S Dislocation of tarsometatarsal joint of left foot, sequela: Secondary | ICD-10-CM | POA: Diagnosis not present

## 2020-11-08 DIAGNOSIS — M25675 Stiffness of left foot, not elsewhere classified: Secondary | ICD-10-CM

## 2020-11-08 DIAGNOSIS — Z9889 Other specified postprocedural states: Secondary | ICD-10-CM

## 2020-11-08 DIAGNOSIS — R2689 Other abnormalities of gait and mobility: Secondary | ICD-10-CM

## 2020-11-08 DIAGNOSIS — Z8781 Personal history of (healed) traumatic fracture: Secondary | ICD-10-CM

## 2020-11-08 DIAGNOSIS — R262 Difficulty in walking, not elsewhere classified: Secondary | ICD-10-CM

## 2020-11-08 NOTE — Therapy (Signed)
[place], [place], [address] Phone: [phone]   Fax:  [phone]  Physical Therapy Treatment  Patient Details  Name: Valerie Friedman MRN: [id number] Date of Birth: 19-Feb-1975 Referring Provider (PT): [name], MD   Encounter Date: 11/08/2020   PT End of Session - 11/09/20 1324     Visit Number 3    Number of Visits 17    Date for PT Re-Evaluation 12/29/20    Authorization Type UNITEDHEALTHC[employer] DUAL COMPLETE; Medicaid [place] Access    PT Start Time 1500    PT Stop Time 1545    PT Time Calculation (min) 45 min    Activity Tolerance Patient tolerated treatment well    Behavior During Therapy [facility] for tasks assessed/performed             Past Medical History:  Diagnosis Date   Anemia    Anxiety    Arthritis    Cataracts, bilateral    MD just watching   Chronic lower back pain DUE TO MYOTONIC DYSTROPHY   Complication of anesthesia    slow to awaken , respirations either slow or fast.   Depression    patient denies this dx,  Therapist said that patient that she was fine after 2 years- ~2000   Endometriosis    Family history of adverse reaction to anesthesia 2022   brother was discharged after surgery had to be  taken back to hospital  CO2 was elevated, he was in the hospital for 9 days.   GERD (gastroesophageal reflux disease)    diet controlled   History of right foot drop    Migraine    Myotonic dystrophy ([place]) DX JULY 2009  ----- TYPE 1   FOLLOWED BY DR. LOVE   Numbness of fingers BILATERAL HANDS/  OCCASIONLLY RADIATES UP ARMS   Pelvic pain in female    Pneumonia    Seasonal allergies    Sleep apnea    does not use cpap   Vertigo    no current problems in the last year   Vitamin D deficiency     Past Surgical History:  Procedure Laterality Date   BENIGN BREAST BX   FEB  2011   BREAST BIOPSY Right 2011   Benign Stereo    LAPAROSCOPY  05/01/2011   Procedure:  LAPAROSCOPY DIAGNOSTIC;  Surgeon: Bennetta Laos, MD;  Location: [facility];  Service: Gynecology;  Laterality: N/A;  with bilateral chromopertubation, aspiration of functional left ovarian cyst, excision of two hydatid cysts   MUSCLE BIOPSY  02-25-2007   LEFT THIGH QUADRICEP BX X3  FOR MYOSITIS   OPEN REDUCTION INTERNAL FIXATION (ORIF) FOOT LISFRANC FRACTURE Left 09/05/2020   Procedure: OPEN REDUCTION INTERNAL FIXATION (ORIF) LEFT FOOT LISFRANC FRACTURE DISLOCATION;  Surgeon: [name], MD;  Location: [place];  Service: Orthopedics;  Laterality: Left;   SURG. FOR REMOVAL OF TEETH FRAGMENTS    MARCH 2012   ORAL SURGEON OFFICE   WISDOM TOOTH EXTRACTION      There were no vitals filed for this visit.   Subjective Assessment - 11/08/20 1507     Subjective Pt reports the R foot surgical pain is not bothering her currently and her great toe is feeling better.    Pertinent History ORIF s/p Lisfranc dislocation; Myotonic dystrophy; L GT injury s/p fall with falling OOB 10/9; anxiety    Diagnostic tests X-ray 10/23/20:Graphs of the left great toe are negative.  No  sign of fracture. X-ray 10/16/20:Three-view radiographs of the left foot shows stable internal fixation   without lucency without loss of reduction.    Patient Stated Goals "To walk without pain"    Currently in Pain? Yes    Pain Score 0-No pain    Pain Location Foot    Pain Orientation Left    Pain Descriptors / Indicators Aching    Pain Type Surgical pain    Pain Onset More than a month ago    Pain Frequency Intermittent             Therapeutic Exercise:  - Ankle rockerboard fwd/bwd and lateral 1 min each - Towel scrunches 2 x 10  - Calf stretch on wedge, Both, 30 sec, 2x - Soleus stretch, L and R, 30 sec - Sit to/from standing, 2x5 - Supine SLR, L and R, 2x10  - Supine hip clams, green Tband   Manual Therapy: - N/a   Neuromuscular re-ed: - n/a   Therapeutic Activity: - n/a   Self-care/Home  Management: - instructed in and this PT completed ace bandage wrapping to manage  L foot/lower leg swelling. Pt voiced understanding. - Updated HEP for LE strengthening exs                             PT Short Term Goals - 10/24/20 0615       PT SHORT TERM GOAL #1   Title Pt will be Ind with and initial HEP    Baseline started on eval    Status New    Target Date 11/14/20      PT SHORT TERM GOAL #2   Title Pt will voice understanding of measures to decrease L foot pain    Status New    Target Date 11/14/20               PT Long Term Goals - 11/01/20 1556       PT LONG TERM GOAL #1   Title Pt will be Ind in a final HEP to maintain achieved LOF    Status New    Target Date 12/29/20      PT LONG TERM GOAL #2   Title Increase biat ankle DF ROM to 0d (neutral) forimproved gait quality and to decrease strain of the L mid foot    Status New    Target Date 12/29/20      PT LONG TERM GOAL #3   Title Increase L toe flexion ROM to 75% of the R for improved L foot function with pushing off and balance    Status New    Target Date 12/29/20      PT LONG TERM GOAL #4   Title Increase L foot DF and Evervion strength to 5/5 for improved L foot function and balance with gait    Status New    Target Date 12/29/20      PT LONG TERM GOAL #5   Title Pt will report a decrease in L foot pain to 4/10 or less for improved functional mobility and QOL    Baseline 9/10    Status New    Target Date 12/29/20      Additional Long Term Goals   Additional Long Term Goals Yes      PT LONG TERM GOAL #6   Title Patient will ambulate at least 400 ft durign 2MWT test to signify improvements in gait speed.  Baseline 333 ft    Status New    Target Date 12/29/20                   Plan - 11/09/20 1325     Clinical Impression Statement PT was completed for strength/stability, ROM, and balance for the R foot/ankle/LE. Pt walks with early heel off bilat. Pace  and quality of gait is improved with an antalgic gait pattern not being observed. Pt was able to tolerate wedge and soleus stretching today. Strengthening exs for the hips and knees were added to pt's HEP. See self care for ace bandage wrapping for swelling management. Pt tolerated today's session without adverse effects.    Personal Factors and Comorbidities Comorbidity 3+    Comorbidities ORIF s/p Lisfranc dislocation; Myotonic dystrophy; L GT injury s/p fall with falling OOB 10/9; anxiety    Examination-Activity Limitations Locomotion Level;Transfers;Stand    Stability/Clinical Decision Making Evolving/Moderate complexity    Clinical Decision Making Moderate    Rehab Potential Good    PT Frequency 2x / week    PT Duration 8 weeks    PT Treatment/Interventions ADLs/Self Care Home Management;Iontophoresis 4mg /ml Dexamethasone;Moist Heat;Ultrasound;Balance training;Therapeutic exercise;Therapeutic activities;Functional mobility training;Stair training;Gait training;Patient/family education;Manual techniques;Dry needling;Taping;Vasopneumatic Device    PT Next Visit Plan gait training, ankle strengthening/ROM    PT Home Exercise Plan [email or web address]    Consulted and Agree with Plan of Care Patient             Patient will benefit from skilled therapeutic intervention in order to improve the following deficits and impairments:  Abnormal gait, Difficulty walking, Decreased range of motion, Pain, Decreased activity tolerance, Decreased mobility, Decreased strength  Visit Diagnosis: No diagnosis found.     Problem List Patient Active Problem List   Diagnosis Date Noted   Lisfranc dislocation, left, sequela    Vertigo 10/28/2017   GERD (gastroesophageal reflux disease) 06/15/2017   Circadian rhythm sleep disorder 04/30/2017   OSA (obstructive sleep apnea) 04/30/2017   Vitamin D deficiency 04/16/2016   Foot drop, right 03/26/2016   Anxiety    Myotonic dystrophy ([place])    Fibromyalgia     Migraine    WEIGHT LOSS 07/11/2008   HEADACHE 07/11/2008   NICOTINE ADDICTION 10/20/2007   MYOTONIC MUSCULAR DYSTROPHY 09/24/2007   MYALGIA/MYOSITIS NOS 08/03/2006   Depression 06/23/2006   Gar Ponto MS, PT 11/09/20 1:35 PM   [facility] [address] [place], [place], [address] Phone: [phone]   Fax:  [phone]  Name: Valerie Friedman MRN: [id number] Date of Birth: 1975-02-25

## 2020-11-13 ENCOUNTER — Ambulatory Visit: Payer: 59 | Attending: Orthopedic Surgery

## 2020-11-13 ENCOUNTER — Other Ambulatory Visit: Payer: Self-pay

## 2020-11-13 DIAGNOSIS — M6281 Muscle weakness (generalized): Secondary | ICD-10-CM | POA: Diagnosis present

## 2020-11-13 DIAGNOSIS — Z9889 Other specified postprocedural states: Secondary | ICD-10-CM | POA: Insufficient documentation

## 2020-11-13 DIAGNOSIS — R262 Difficulty in walking, not elsewhere classified: Secondary | ICD-10-CM | POA: Insufficient documentation

## 2020-11-13 DIAGNOSIS — S93325S Dislocation of tarsometatarsal joint of left foot, sequela: Secondary | ICD-10-CM | POA: Diagnosis present

## 2020-11-13 DIAGNOSIS — Z8781 Personal history of (healed) traumatic fracture: Secondary | ICD-10-CM | POA: Diagnosis present

## 2020-11-13 DIAGNOSIS — R2689 Other abnormalities of gait and mobility: Secondary | ICD-10-CM | POA: Diagnosis present

## 2020-11-13 DIAGNOSIS — M25572 Pain in left ankle and joints of left foot: Secondary | ICD-10-CM | POA: Insufficient documentation

## 2020-11-13 DIAGNOSIS — M25675 Stiffness of left foot, not elsewhere classified: Secondary | ICD-10-CM | POA: Diagnosis present

## 2020-11-13 NOTE — Therapy (Signed)
[place], [place], [address] Phone: [phone]   Fax:  [phone]  Physical Therapy Treatment  Patient Details  Name: Valerie Friedman MRN: [id number] Date of Birth: 1975-12-29 Referring Provider (PT): [name], MD   Encounter Date: 11/13/2020   PT End of Session - 11/13/20 1459     Visit Number 4    Number of Visits 17    Date for PT Re-Evaluation 12/29/20    Authorization Type [employer] DUAL COMPLETE; Medicaid [place] Access    PT Start Time 1500    PT Stop Time 1545    PT Time Calculation (min) 45 min    Activity Tolerance Patient tolerated treatment well    Behavior During Therapy [facility] for tasks assessed/performed             Past Medical History:  Diagnosis Date   Anemia    Anxiety    Arthritis    Cataracts, bilateral    MD just watching   Chronic lower back pain DUE TO MYOTONIC DYSTROPHY   Complication of anesthesia    slow to awaken , respirations either slow or fast.   Depression    patient denies this dx,  Therapist said that patient that she was fine after 2 years- ~2000   Endometriosis    Family history of adverse reaction to anesthesia 2022   brother was discharged after surgery had to be  taken back to hospital  CO2 was elevated, he was in the hospital for 9 days.   GERD (gastroesophageal reflux disease)    diet controlled   History of right foot drop    Migraine    Myotonic dystrophy ([place]) DX JULY 2009  ----- TYPE 1   FOLLOWED BY DR. LOVE   Numbness of fingers BILATERAL HANDS/  OCCASIONLLY RADIATES UP ARMS   Pelvic pain in female    Pneumonia    Seasonal allergies    Sleep apnea    does not use cpap   Vertigo    no current problems in the last year   Vitamin D deficiency     Past Surgical History:  Procedure Laterality Date   BENIGN BREAST BX   FEB  2011   BREAST BIOPSY Right 2011   Benign Stereo    LAPAROSCOPY  05/01/2011   Procedure:  LAPAROSCOPY DIAGNOSTIC;  Surgeon: Bennetta Laos, MD;  Location: [facility];  Service: Gynecology;  Laterality: N/A;  with bilateral chromopertubation, aspiration of functional left ovarian cyst, excision of two hydatid cysts   MUSCLE BIOPSY  02-25-2007   LEFT THIGH QUADRICEP BX X3  FOR MYOSITIS   OPEN REDUCTION INTERNAL FIXATION (ORIF) FOOT LISFRANC FRACTURE Left 09/05/2020   Procedure: OPEN REDUCTION INTERNAL FIXATION (ORIF) LEFT FOOT LISFRANC FRACTURE DISLOCATION;  Surgeon: [name], MD;  Location: [facility];  Service: Orthopedics;  Laterality: Left;   SURG. FOR REMOVAL OF TEETH FRAGMENTS    MARCH 2012   ORAL SURGEON OFFICE   WISDOM TOOTH EXTRACTION      There were no vitals filed for this visit.   Subjective Assessment - 11/13/20 1509     Subjective Pt reports falling down steps 2 steps, 2 days ago and hitting a concrete wall. She reports her L ankle agve out. Pt states she was using a hand rail. Pt denies injury.    Pertinent History ORIF s/p Lisfranc dislocation; Myotonic dystrophy; L GT injury s/p fall with falling OOB 10/9; anxiety  Limitations Standing;Walking;House hold activities    Diagnostic tests X-ray 10/23/20:Graphs of the left great toe are negative.  No sign of fracture. X-ray 10/16/20:Three-view radiographs of the left foot shows stable internal fixation   without lucency without loss of reduction.    Patient Stated Goals "To walk without pain"    Currently in Pain? No/denies    Pain Location Foot    Pain Orientation Left    Pain Descriptors / Indicators Aching    Pain Type Surgical pain    Pain Onset More than a month ago    Pain Frequency Intermittent    Aggravating Factors  Stairs    Pain Relieving Factors Rest               Therapeutic Exercise: -NuStep, 5 mins, L 4, LE,  - Towel scrunches 2 x 20. Decreased L toe flexion initially. After mobs and flexion stretches, active flexion on the 2nd sets was improved. - Calf stretch on  wedge, Both, for gastroc and soleus, 30 sec, 2x each - Sit to/from standing, x10, Decreased knee ext/control  - 4 way ankle red Tband. X10, Medial deviation c PF and weak evertors  Manual Therapy: - Axial distraction for 1st - 5th rays of the L foot f/b passive flexion and then passive PF of the ankle and flexion of the toes   Neuromuscular re-ed: - n/a   Therapeutic Activity: - n/a   Self-care/Home Management: - Updated HEP for ankle strengthening and DF ROM exs. Pt voiced understanding and returned demonstration. - Instructed pt in axial distraction mobs, passive flexion of toes, and passive PF and toe flexion of the L foot. Pt returned demonstration. - Advised pt to use a handrail on steps at all times and to asc/dsc sideways for safety.  Not completed:  - Ankle rockerboard fwd/bwd and lateral 1 min each                           PT Short Term Goals - 11/13/20 1707       PT SHORT TERM GOAL #1   Title Pt will be Ind with and initial HEP    Status Achieved    Target Date 11/13/20      PT SHORT TERM GOAL #2   Title Pt will voice understanding of measures to decrease L foot pain. 11/13/20: Pt is able to recall measures of elevation, meds, cold packs, adn wrapping with an ace bandage to manage pain    Status Achieved    Target Date 11/13/20               PT Long Term Goals - 11/01/20 1556       PT LONG TERM GOAL #1   Title Pt will be Ind in a final HEP to maintain achieved LOF    Status New    Target Date 12/29/20      PT LONG TERM GOAL #2   Title Increase biat ankle DF ROM to 0d (neutral) forimproved gait quality and to decrease strain of the L mid foot    Status New    Target Date 12/29/20      PT LONG TERM GOAL #3   Title Increase L toe flexion ROM to 75% of the R for improved L foot function with pushing off and balance    Status New    Target Date 12/29/20      PT LONG TERM GOAL #4   Title Increase  L foot DF and Evervion strength  to 5/5 for improved L foot function and balance with gait    Status New    Target Date 12/29/20      PT LONG TERM GOAL #5   Title Pt will report a decrease in L foot pain to 4/10 or less for improved functional mobility and QOL    Baseline 9/10    Status New    Target Date 12/29/20      Additional Long Term Goals   Additional Long Term Goals Yes      PT LONG TERM GOAL #6   Title Patient will ambulate at least 400 ft durign 2MWT test to signify improvements in gait speed.    Baseline 333 ft    Status New    Target Date 12/29/20                   Plan - 11/13/20 1500     Clinical Impression Statement PT was completed for mobility of the L ankle and for flexion of the L toes, and for strengthening of the L ankle/LEs. Following mobs and flexion streches for the toes of the L foot, active toe fexion with towel scrunches improved. Strength issues were noted of the quads with both standing and sitting. With decreased quad strength, pt was advised to use a handrail at all times and to asc/dsc steps sideways for safety.    Personal Factors and Comorbidities Comorbidity 3+    Comorbidities ORIF s/p Lisfranc dislocation; Myotonic dystrophy; L GT injury s/p fall with falling OOB 10/9; anxiety    Examination-Activity Limitations Locomotion Level;Transfers;Stand    Stability/Clinical Decision Making Evolving/Moderate complexity    Clinical Decision Making Moderate    Rehab Potential Good    PT Frequency 2x / week    PT Duration 8 weeks    PT Treatment/Interventions ADLs/Self Care Home Management;Iontophoresis 4mg /ml Dexamethasone;Moist Heat;Ultrasound;Balance training;Therapeutic exercise;Therapeutic activities;Functional mobility training;Stair training;Gait training;Patient/family education;Manual techniques;Dry needling;Taping;Vasopneumatic Device    PT Next Visit Plan gait training, ankle strengthening/ROM    PT Home Exercise Plan [email or web address]    Consulted and Agree with Plan of Care  Patient             Patient will benefit from skilled therapeutic intervention in order to improve the following deficits and impairments:  Abnormal gait, Difficulty walking, Decreased range of motion, Pain, Decreased activity tolerance, Decreased mobility, Decreased strength  Visit Diagnosis: S/P ORIF (open reduction internal fixation) fracture  Pain in left ankle and joints of left foot  Decreased ROM of left foot  Muscle weakness (generalized)  Difficulty in walking, not elsewhere classified  Other abnormalities of gait and mobility  Lisfranc's dislocation, left, sequela     Problem List Patient Active Problem List   Diagnosis Date Noted   Lisfranc dislocation, left, sequela    Vertigo 10/28/2017   GERD (gastroesophageal reflux disease) 06/15/2017   Circadian rhythm sleep disorder 04/30/2017   OSA (obstructive sleep apnea) 04/30/2017   Vitamin D deficiency 04/16/2016   Foot drop, right 03/26/2016   Anxiety    Myotonic dystrophy ([place])    Fibromyalgia    Migraine    WEIGHT LOSS 07/11/2008   HEADACHE 07/11/2008   NICOTINE ADDICTION 10/20/2007   MYOTONIC MUSCULAR DYSTROPHY 09/24/2007   MYALGIA/MYOSITIS NOS 08/03/2006   Depression 06/23/2006    Gar Ponto MS, PT 11/13/20 5:33 PM   [facility] [address] [place], [place], [address] Phone: [phone]   Fax:  [phone]  Name: Valerie Friedman MRN: [id number] Date of Birth: Sep 01, 1975

## 2020-11-15 ENCOUNTER — Ambulatory Visit: Payer: 59

## 2020-11-20 ENCOUNTER — Other Ambulatory Visit: Payer: Self-pay

## 2020-11-20 ENCOUNTER — Ambulatory Visit: Payer: 59

## 2020-11-20 DIAGNOSIS — Z9889 Other specified postprocedural states: Secondary | ICD-10-CM

## 2020-11-20 DIAGNOSIS — M6281 Muscle weakness (generalized): Secondary | ICD-10-CM

## 2020-11-20 DIAGNOSIS — S93325S Dislocation of tarsometatarsal joint of left foot, sequela: Secondary | ICD-10-CM

## 2020-11-20 DIAGNOSIS — Z8781 Personal history of (healed) traumatic fracture: Secondary | ICD-10-CM

## 2020-11-20 DIAGNOSIS — M25675 Stiffness of left foot, not elsewhere classified: Secondary | ICD-10-CM

## 2020-11-20 DIAGNOSIS — R2689 Other abnormalities of gait and mobility: Secondary | ICD-10-CM

## 2020-11-20 DIAGNOSIS — M25572 Pain in left ankle and joints of left foot: Secondary | ICD-10-CM

## 2020-11-20 DIAGNOSIS — R262 Difficulty in walking, not elsewhere classified: Secondary | ICD-10-CM

## 2020-11-20 NOTE — Therapy (Signed)
[place], [place], [address] Phone: [phone]   Fax:  [phone]  Physical Therapy Treatment  Patient Details  Name: Valerie Friedman MRN: [id number] Date of Birth: 01/03/1976 Referring Provider (PT): [name], MD   Encounter Date: 11/20/2020   PT End of Session - 11/20/20 1826     Visit Number 5    Number of Visits 17    Date for PT Re-Evaluation 12/29/20    Authorization Type UNITEDHEALTHC[employer] DUAL COMPLETE; Medicaid [place] Access    PT Start Time [phone]    PT Stop Time 1913    PT Time Calculation (min) 43 min    Activity Tolerance Patient tolerated treatment well    Behavior During Therapy [facility] for tasks assessed/performed             Past Medical History:  Diagnosis Date   Anemia    Anxiety    Arthritis    Cataracts, bilateral    MD just watching   Chronic lower back pain DUE TO MYOTONIC DYSTROPHY   Complication of anesthesia    slow to awaken , respirations either slow or fast.   Depression    patient denies this dx,  Therapist said that patient that she was fine after 2 years- ~2000   Endometriosis    Family history of adverse reaction to anesthesia 2022   brother was discharged after surgery had to be  taken back to hospital  CO2 was elevated, he was in the hospital for 9 days.   GERD (gastroesophageal reflux disease)    diet controlled   History of right foot drop    Migraine    Myotonic dystrophy ([place]) DX JULY 2009  ----- TYPE 1   FOLLOWED BY DR. LOVE   Numbness of fingers BILATERAL HANDS/  OCCASIONLLY RADIATES UP ARMS   Pelvic pain in female    Pneumonia    Seasonal allergies    Sleep apnea    does not use cpap   Vertigo    no current problems in the last year   Vitamin D deficiency     Past Surgical History:  Procedure Laterality Date   BENIGN BREAST BX   FEB  2011   BREAST BIOPSY Right 2011   Benign Stereo    LAPAROSCOPY  05/01/2011   Procedure:  LAPAROSCOPY DIAGNOSTIC;  Surgeon: Bennetta Laos, MD;  Location: [facility];  Service: Gynecology;  Laterality: N/A;  with bilateral chromopertubation, aspiration of functional left ovarian cyst, excision of two hydatid cysts   MUSCLE BIOPSY  02-25-2007   LEFT THIGH QUADRICEP BX X3  FOR MYOSITIS   OPEN REDUCTION INTERNAL FIXATION (ORIF) FOOT LISFRANC FRACTURE Left 09/05/2020   Procedure: OPEN REDUCTION INTERNAL FIXATION (ORIF) LEFT FOOT LISFRANC FRACTURE DISLOCATION;  Surgeon: [name], MD;  Location: [facility];  Service: Orthopedics;  Laterality: Left;   SURG. FOR REMOVAL OF TEETH FRAGMENTS    MARCH 2012   ORAL SURGEON OFFICE   WISDOM TOOTH EXTRACTION      There were no vitals filed for this visit.   Subjective Assessment - 11/20/20 1832     Subjective Patient reports the toes are sore today and attributes this to getting a pedicure yesterday.    Currently in Pain? Yes    Pain Score 5     Pain Location Toe (Comment which one)   1st and 4th digit   Pain Orientation Left    Pain Descriptors / Indicators  Sore    Pain Type Surgical pain    Pain Onset More than a month ago    Pain Frequency Intermittent                [facility] PT Assessment - 11/20/20 0001       AROM   Right Ankle Dorsiflexion 0    Left Ankle Dorsiflexion 0                 Therapeutic Exercise: - NuStep, 5 mins, L4, LE only - Calf stretch on wedge x 60 sec each for gastroc and soleus.  - 3 way hip 2 x 10 Single UE support needed for balance  - Omega leg press 35 lbs 1 x 10, 45 lbs 2 x 10     Not performed today: - Towel scrunches 2 x 20. Decreased L toe flexion initially. After mobs and flexion stretches, active flexion on the 2nd sets was improved. - Sit to/from standing, x10, Decreased knee ext/control  - 4 way ankle red Tband. X10, Medial deviation c PF and weak evertors  Manual Therapy: - N/A   Neuromuscular re-ed: - Romberg on airex 30 sec - semi-tandem on airex 2 x  30 sec each - Romberg on airex eyes closed attempted, unable  - Romberg level surface eyes closed 2 x 30 sec    Therapeutic Activity: - n/a   Self-care/Home Management: - N/A                               PT Short Term Goals - 11/13/20 1707       PT SHORT TERM GOAL #1   Title Pt will be Ind with and initial HEP    Status Achieved    Target Date 11/13/20      PT SHORT TERM GOAL #2   Title Pt will voice understanding of measures to decrease L foot pain. 11/13/20: Pt is able to recall measures of elevation, meds, cold packs, adn wrapping with an ace bandage to manage pain    Status Achieved    Target Date 11/13/20               PT Long Term Goals - 11/01/20 1556       PT LONG TERM GOAL #1   Title Pt will be Ind in a final HEP to maintain achieved LOF    Status New    Target Date 12/29/20      PT LONG TERM GOAL #2   Title Increase biat ankle DF ROM to 0d (neutral) forimproved gait quality and to decrease strain of the L mid foot    Status New    Target Date 12/29/20      PT LONG TERM GOAL #3   Title Increase L toe flexion ROM to 75% of the R for improved L foot function with pushing off and balance    Status New    Target Date 12/29/20      PT LONG TERM GOAL #4   Title Increase L foot DF and Evervion strength to 5/5 for improved L foot function and balance with gait    Status New    Target Date 12/29/20      PT LONG TERM GOAL #5   Title Pt will report a decrease in L foot pain to 4/10 or less for improved functional mobility and QOL    Baseline 9/10    Status New  Target Date 12/29/20      Additional Long Term Goals   Additional Long Term Goals Yes      PT LONG TERM GOAL #6   Title Patient will ambulate at least 400 ft durign 2MWT test to signify improvements in gait speed.    Baseline 333 ft    Status New    Target Date 12/29/20                   Plan - 11/20/20 1834     Clinical Impression Statement Patient's  dorsiflexion AROM has much improved compared to baseline achieving neutral bilaterally today. Session focused on balance training today and proximal strengthening to assist in improving overall ankle stability. With SL standing strengthening she requires single UE support to maintain balance. Introduced static balance on stable and unstable surface with patient demonstrating minimal postural sway with all activity. She does immediately utilize reaching strategy to regain her balance as needed. Occasional seated rest break required during today's session due to musculature fatigue, though overall she tolerated progression of standing activity well today.    PT Treatment/Interventions ADLs/Self Care Home Management;Iontophoresis 4mg /ml Dexamethasone;Moist Heat;Ultrasound;Balance training;Therapeutic exercise;Therapeutic activities;Functional mobility training;Stair training;Gait training;Patient/family education;Manual techniques;Dry needling;Taping;Vasopneumatic Device    PT Next Visit Plan FOTO, gait training, ankle strengthening/ROM    PT Home Exercise Plan [email or web address]             Patient will benefit from skilled therapeutic intervention in order to improve the following deficits and impairments:  Abnormal gait, Difficulty walking, Decreased range of motion, Pain, Decreased activity tolerance, Decreased mobility, Decreased strength  Visit Diagnosis: S/P ORIF (open reduction internal fixation) fracture  Pain in left ankle and joints of left foot  Decreased ROM of left foot  Muscle weakness (generalized)  Difficulty in walking, not elsewhere classified  Other abnormalities of gait and mobility  Lisfranc's dislocation, left, sequela     Problem List Patient Active Problem List   Diagnosis Date Noted   Lisfranc dislocation, left, sequela    Vertigo 10/28/2017   GERD (gastroesophageal reflux disease) 06/15/2017   Circadian rhythm sleep disorder 04/30/2017   OSA (obstructive sleep  apnea) 04/30/2017   Vitamin D deficiency 04/16/2016   Foot drop, right 03/26/2016   Anxiety    Myotonic dystrophy ([place])    Fibromyalgia    Migraine    WEIGHT LOSS 07/11/2008   HEADACHE 07/11/2008   NICOTINE ADDICTION 10/20/2007   MYOTONIC MUSCULAR DYSTROPHY 09/24/2007   MYALGIA/MYOSITIS NOS 08/03/2006   Depression 06/23/2006   Gwendolyn Grant, PT, DPT, ATC 11/20/20 7:16 PM   [facility] [address] [place], [place], [address] Phone: [phone]   Fax:  [phone]  Name: REYLYNN VANALSTINE MRN: [id number] Date of Birth: [birth year]

## 2020-11-22 ENCOUNTER — Other Ambulatory Visit: Payer: Self-pay

## 2020-11-22 ENCOUNTER — Ambulatory Visit: Payer: 59

## 2020-11-22 DIAGNOSIS — Z9889 Other specified postprocedural states: Secondary | ICD-10-CM

## 2020-11-22 DIAGNOSIS — M6281 Muscle weakness (generalized): Secondary | ICD-10-CM

## 2020-11-22 DIAGNOSIS — R262 Difficulty in walking, not elsewhere classified: Secondary | ICD-10-CM

## 2020-11-22 DIAGNOSIS — S93325S Dislocation of tarsometatarsal joint of left foot, sequela: Secondary | ICD-10-CM

## 2020-11-22 DIAGNOSIS — R2689 Other abnormalities of gait and mobility: Secondary | ICD-10-CM

## 2020-11-22 DIAGNOSIS — M25675 Stiffness of left foot, not elsewhere classified: Secondary | ICD-10-CM

## 2020-11-22 DIAGNOSIS — M25572 Pain in left ankle and joints of left foot: Secondary | ICD-10-CM

## 2020-11-22 DIAGNOSIS — Z8781 Personal history of (healed) traumatic fracture: Secondary | ICD-10-CM

## 2020-11-22 NOTE — Therapy (Signed)
[place], [place], [address] Phone: [phone]   Fax:  [phone]  Physical Therapy Treatment  Patient Details  Name: Valerie Friedman MRN: [id number] Date of Birth: [birth year] Referring Provider (PT): [name], MD   Encounter Date: 11/22/2020    Past Medical History:  Diagnosis Date   Anemia    Anxiety    Arthritis    Cataracts, bilateral    MD just watching   Chronic lower back pain DUE TO MYOTONIC DYSTROPHY   Complication of anesthesia    slow to awaken , respirations either slow or fast.   Depression    patient denies this dx,  Therapist said that patient that she was fine after 2 years- ~2000   Endometriosis    Family history of adverse reaction to anesthesia 2022   brother was discharged after surgery had to be  taken back to hospital  CO2 was elevated, he was in the hospital for 9 days.   GERD (gastroesophageal reflux disease)    diet controlled   History of right foot drop    Migraine    Myotonic dystrophy ([place]) DX JULY 2009  ----- TYPE 1   FOLLOWED BY DR. LOVE   Numbness of fingers BILATERAL HANDS/  OCCASIONLLY RADIATES UP ARMS   Pelvic pain in female    Pneumonia    Seasonal allergies    Sleep apnea    does not use cpap   Vertigo    no current problems in the last year   Vitamin D deficiency     Past Surgical History:  Procedure Laterality Date   BENIGN BREAST BX   FEB  2011   BREAST BIOPSY Right 2011   Benign Stereo    LAPAROSCOPY  05/01/2011   Procedure: LAPAROSCOPY DIAGNOSTIC;  Surgeon: Bennetta Laos, MD;  Location: [facility];  Service: Gynecology;  Laterality: N/A;  with bilateral chromopertubation, aspiration of functional left ovarian cyst, excision of two hydatid cysts   MUSCLE BIOPSY  02-25-2007   LEFT THIGH QUADRICEP BX X3  FOR MYOSITIS   OPEN REDUCTION INTERNAL FIXATION (ORIF) FOOT LISFRANC FRACTURE Left 09/05/2020   Procedure: OPEN  REDUCTION INTERNAL FIXATION (ORIF) LEFT FOOT LISFRANC FRACTURE DISLOCATION;  Surgeon: [name], MD;  Location: [facility];  Service: Orthopedics;  Laterality: Left;   SURG. FOR REMOVAL OF TEETH FRAGMENTS    MARCH 2012   ORAL SURGEON OFFICE   WISDOM TOOTH EXTRACTION      There were no vitals filed for this visit.   Subjective Assessment - 11/22/20 2303     Subjective Pt reports she is feeling tired and fatigued today    Pertinent History ORIF s/p Lisfranc dislocation; Myotonic dystrophy; L GT injury s/p fall with falling OOB 10/9; anxiety    Diagnostic tests X-ray 10/23/20:Graphs of the left great toe are negative.  No sign of fracture. X-ray 10/16/20:Three-view radiographs of the left foot shows stable internal fixation   without lucency without loss of reduction.    Patient Stated Goals "To walk without pain"    Currently in Pain? No/denies    Pain Score 0-No pain    Pain Location Foot    Pain Orientation Left    Pain Descriptors / Indicators Sore    Pain Type Surgical pain    Pain Onset More than a month ago    Pain Frequency Intermittent  [facility] Adult PT Treatment/Exercise:  Therapeutic Exercise: - Stationary bike, 3 mins, L 1 - Marble pick up with all toes - Calf stretch on wedge, Both, for gastroc and soleus, 30 sec, 2x each - Lateral step ups to 2" airex, 2x10. (Decreased quad strength and quality of knee movement bilat) - Sit to/from standing, x10 (Decreased quad control concentrically and eccentrically)    Manual Therapy: - Axial distraction for 1st - 5th rays of the L foot f/b passive flexion and then passive PF of the ankle and flexion of the toes   Neuromuscular re-ed: - n/a   Therapeutic Activity: - n/a   Self-care/Home Management: - Instructed pt in gentle CFM to dorsal foot scar    Not completed:  - Ankle rockerboard fwd/bwd and lateral 1 min each - Towel scrunches 2 x 20. - 4 way ankle red Tband. X10, Medial deviation c PF and  weak evertors    - 4 way ankle red Tband. X10, Medial deviation c PF and weak evertors  - Sit to/from standing, x1                           PT Short Term Goals - 11/13/20 1707       PT SHORT TERM GOAL #1   Title Pt will be Ind with and initial HEP    Status Achieved    Target Date 11/13/20      PT SHORT TERM GOAL #2   Title Pt will voice understanding of measures to decrease L foot pain. 11/13/20: Pt is able to recall measures of elevation, meds, cold packs, adn wrapping with an ace bandage to manage pain    Status Achieved    Target Date 11/13/20               PT Long Term Goals - 11/01/20 1556       PT LONG TERM GOAL #1   Title Pt will be Ind in a final HEP to maintain achieved LOF    Status New    Target Date 12/29/20      PT LONG TERM GOAL #2   Title Increase biat ankle DF ROM to 0d (neutral) forimproved gait quality and to decrease strain of the L mid foot    Status New    Target Date 12/29/20      PT LONG TERM GOAL #3   Title Increase L toe flexion ROM to 75% of the R for improved L foot function with pushing off and balance    Status New    Target Date 12/29/20      PT LONG TERM GOAL #4   Title Increase L foot DF and Evervion strength to 5/5 for improved L foot function and balance with gait    Status New    Target Date 12/29/20      PT LONG TERM GOAL #5   Title Pt will report a decrease in L foot pain to 4/10 or less for improved functional mobility and QOL    Baseline 9/10    Status New    Target Date 12/29/20      Additional Long Term Goals   Additional Long Term Goals Yes      PT LONG TERM GOAL #6   Title Patient will ambulate at least 400 ft durign 2MWT test to signify improvements in gait speed.    Baseline 333 ft    Status New    Target Date 12/29/20  Plan - 11/22/20 2252     Clinical Impression Statement PT was completed for L toe flexion with marble pick up which the pt was able to doe  with each set of paired toes. Strengthening was also completed with emphasis of the quads which demonstrate decrease control with sit to standing and SL step ups to a low balance mat. Overall, pt is making gains re: pain level and slow, but improved gains re: functional mobility.    Personal Factors and Comorbidities Comorbidity 3+    Comorbidities ORIF s/p Lisfranc dislocation; Myotonic dystrophy; L GT injury s/p fall with falling OOB 10/9; anxiety    Examination-Activity Limitations Locomotion Level;Transfers;Stand    Stability/Clinical Decision Making Evolving/Moderate complexity    Clinical Decision Making Moderate    Rehab Potential Good    PT Frequency 2x / week    PT Duration 8 weeks    PT Treatment/Interventions ADLs/Self Care Home Management;Iontophoresis 4mg /ml Dexamethasone;Moist Heat;Ultrasound;Balance training;Therapeutic exercise;Therapeutic activities;Functional mobility training;Stair training;Gait training;Patient/family education;Manual techniques;Dry needling;Taping;Vasopneumatic Device    PT Next Visit Plan FOTO, gait training, ankle strengthening/ROM    PT Home Exercise Plan [id number]             Patient will benefit from skilled therapeutic intervention in order to improve the following deficits and impairments:  Abnormal gait, Difficulty walking, Decreased range of motion, Pain, Decreased activity tolerance, Decreased mobility, Decreased strength  Visit Diagnosis: Pain in left ankle and joints of left foot  Decreased ROM of left foot  Muscle weakness (generalized)  Difficulty in walking, not elsewhere classified  Other abnormalities of gait and mobility  Lisfranc's dislocation, left, sequela  S/P ORIF (open reduction internal fixation) fracture     Problem List Patient Active Problem List   Diagnosis Date Noted   Lisfranc dislocation, left, sequela    Vertigo 10/28/2017   GERD (gastroesophageal reflux disease) 06/15/2017   Circadian rhythm sleep  disorder 04/30/2017   OSA (obstructive sleep apnea) 04/30/2017   Vitamin D deficiency 04/16/2016   Foot drop, right 03/26/2016   Anxiety    Myotonic dystrophy ([place])    Fibromyalgia    Migraine    WEIGHT LOSS 07/11/2008   HEADACHE 07/11/2008   NICOTINE ADDICTION 10/20/2007   MYOTONIC MUSCULAR DYSTROPHY 09/24/2007   MYALGIA/MYOSITIS NOS 08/03/2006   Depression 06/23/2006   Gar Ponto MS, PT 11/22/20 11:05 PM   [facility] [address] [place], [place], [address] Phone: [phone]   Fax:  [phone]  Name: Valerie Friedman MRN: [id number] Date of Birth: December 25, 1975

## 2020-11-26 NOTE — Telephone Encounter (Signed)
Err

## 2020-11-27 ENCOUNTER — Ambulatory Visit: Payer: 59

## 2020-11-27 ENCOUNTER — Other Ambulatory Visit: Payer: Self-pay

## 2020-11-27 DIAGNOSIS — R2689 Other abnormalities of gait and mobility: Secondary | ICD-10-CM

## 2020-11-27 DIAGNOSIS — R262 Difficulty in walking, not elsewhere classified: Secondary | ICD-10-CM

## 2020-11-27 DIAGNOSIS — M25675 Stiffness of left foot, not elsewhere classified: Secondary | ICD-10-CM

## 2020-11-27 DIAGNOSIS — Z9889 Other specified postprocedural states: Secondary | ICD-10-CM | POA: Diagnosis not present

## 2020-11-27 DIAGNOSIS — M6281 Muscle weakness (generalized): Secondary | ICD-10-CM

## 2020-11-27 DIAGNOSIS — M25572 Pain in left ankle and joints of left foot: Secondary | ICD-10-CM

## 2020-11-27 NOTE — Therapy (Signed)
[place], [place], [address] Phone: [phone]   Fax:  [phone]  Physical Therapy Treatment  Patient Details  Name: Valerie Friedman MRN: [id number] Date of Birth: [birth year] Referring Provider (PT): [name], MD   Encounter Date: 11/27/2020   PT End of Session - 11/27/20 1444     Visit Number 7    Number of Visits 17    Date for PT Re-Evaluation 12/29/20    Authorization Type [employer] DUAL COMPLETE; Medicaid [place] Access    PT Start Time 0762    PT Stop Time 1528    PT Time Calculation (min) 43 min    Activity Tolerance Patient tolerated treatment well    Behavior During Therapy [facility] for tasks assessed/performed             Past Medical History:  Diagnosis Date   Anemia    Anxiety    Arthritis    Cataracts, bilateral    MD just watching   Chronic lower back pain DUE TO MYOTONIC DYSTROPHY   Complication of anesthesia    slow to awaken , respirations either slow or fast.   Depression    patient denies this dx,  Therapist said that patient that she was fine after 2 years- ~2000   Endometriosis    Family history of adverse reaction to anesthesia 2022   brother was discharged after surgery had to be  taken back to hospital  CO2 was elevated, he was in the hospital for 9 days.   GERD (gastroesophageal reflux disease)    diet controlled   History of right foot drop    Migraine    Myotonic dystrophy ([place]) DX JULY 2009  ----- TYPE 1   FOLLOWED BY DR. LOVE   Numbness of fingers BILATERAL HANDS/  OCCASIONLLY RADIATES UP ARMS   Pelvic pain in female    Pneumonia    Seasonal allergies    Sleep apnea    does not use cpap   Vertigo    no current problems in the last year   Vitamin D deficiency     Past Surgical History:  Procedure Laterality Date   BENIGN BREAST BX   FEB  2011   BREAST BIOPSY Right 2011   Benign Stereo    LAPAROSCOPY  05/01/2011   Procedure:  LAPAROSCOPY DIAGNOSTIC;  Surgeon: Bennetta Laos, MD;  Location: [facility];  Service: Gynecology;  Laterality: N/A;  with bilateral chromopertubation, aspiration of functional left ovarian cyst, excision of two hydatid cysts   MUSCLE BIOPSY  02-25-2007   LEFT THIGH QUADRICEP BX X3  FOR MYOSITIS   OPEN REDUCTION INTERNAL FIXATION (ORIF) FOOT LISFRANC FRACTURE Left 09/05/2020   Procedure: OPEN REDUCTION INTERNAL FIXATION (ORIF) LEFT FOOT LISFRANC FRACTURE DISLOCATION;  Surgeon: [name], MD;  Location: [facility];  Service: Orthopedics;  Laterality: Left;   SURG. FOR REMOVAL OF TEETH FRAGMENTS    MARCH 2012   ORAL SURGEON OFFICE   WISDOM TOOTH EXTRACTION      There were no vitals filed for this visit.   Subjective Assessment - 11/27/20 1447     Subjective "It's cold and rainy, so I'd rather been in my fleece pajamas at home. No pain right now."    Currently in Pain? No/denies                [facility] PT Assessment - 11/27/20 0001       Observation/Other Assessments  Focus on Therapeutic Outcomes (FOTO)  53% function             [facility] Adult PT Treatment/Exercise:   Therapeutic Exercise: - Treadmill speed 1.0 x 5 minutes - DL standing calf raise (attempted, unable to complete due to weakness) - resisted plantarflexion 3 x 10 black band bilateral  - Marble pick up 1 set x 15 marbles seated - Calf raise on leg press 2 x 15 @ 20 lbs (partial range)  - Leg press 3 x 10 @ 40 lbs      Manual Therapy: - N/A   Neuromuscular re-ed: - n/a   Therapeutic Activity: - n/a   Self-care/Home Management: - Education on FOTO score and functional progress.      Not completed: - Calf stretch on wedge, Both, for gastroc and soleus, 30 sec, 2x each - Lateral step ups to 2" airex, 2x10. (Decreased quad strength and quality of knee movement bilat) - Sit to/from standing, x10 (Decreased quad control concentrically and eccentrically)  - Ankle rockerboard fwd/bwd and  lateral 1 min each - Towel scrunches 2 x 20. - 4 way ankle red Tband. X10, Medial deviation c PF and weak evertors                                                                                   PT Short Term Goals - 11/13/20 1707       PT SHORT TERM GOAL #1   Title Pt will be Ind with and initial HEP    Status Achieved    Target Date 11/13/20      PT SHORT TERM GOAL #2   Title Pt will voice understanding of measures to decrease L foot pain. 11/13/20: Pt is able to recall measures of elevation, meds, cold packs, adn wrapping with an ace bandage to manage pain    Status Achieved    Target Date 11/13/20               PT Long Term Goals - 11/01/20 1556       PT LONG TERM GOAL #1   Title Pt will be Ind in a final HEP to maintain achieved LOF    Status New    Target Date 12/29/20      PT LONG TERM GOAL #2   Title Increase biat ankle DF ROM to 0d (neutral) forimproved gait quality and to decrease strain of the L mid foot    Status New    Target Date 12/29/20      PT LONG TERM GOAL #3   Title Increase L toe flexion ROM to 75% of the R for improved L foot function with pushing off and balance    Status New    Target Date 12/29/20      PT LONG TERM GOAL #4   Title Increase L foot DF and Evervion strength to 5/5 for improved L foot function and balance with gait    Status New    Target Date 12/29/20      PT LONG TERM GOAL #5   Title Pt will report a decrease in L foot pain to 4/10 or less for improved functional mobility  and QOL    Baseline 9/10    Status New    Target Date 12/29/20      Additional Long Term Goals   Additional Long Term Goals Yes      PT LONG TERM GOAL #6   Title Patient will ambulate at least 400 ft durign 2MWT test to signify improvements in gait speed.    Baseline 333 ft    Status New    Target Date 12/29/20                   Plan - 11/27/20 1458     Clinical Impression Statement Patient  tolerated session well today without reports of pain. She was unable to complete standing calf raise due to weakness, though able to complete resisted plantarflexion in sitting through full range and partial range calf raise on leg press without issues. She demonstrates good form with leg press with ability to control for knee hyperextension without cueing. Her FOTO score has much improved compared to baseline scoring at 53% function today.    Personal Factors and Comorbidities Comorbidity 3+    Comorbidities ORIF s/p Lisfranc dislocation; Myotonic dystrophy; L GT injury s/p fall with falling OOB 10/9; anxiety    Examination-Activity Limitations Locomotion Level;Transfers;Stand    Stability/Clinical Decision Making Evolving/Moderate complexity    Rehab Potential Good    PT Frequency 2x / week    PT Duration 8 weeks    PT Treatment/Interventions ADLs/Self Care Home Management;Iontophoresis 4mg /ml Dexamethasone;Moist Heat;Ultrasound;Balance training;Therapeutic exercise;Therapeutic activities;Functional mobility training;Stair training;Gait training;Patient/family education;Manual techniques;Dry needling;Taping;Vasopneumatic Device    PT Next Visit Plan check DF, gait training, ankle strengthening/ROM    PT Home Exercise Plan [id number]    Consulted and Agree with Plan of Care Patient             Patient will benefit from skilled therapeutic intervention in order to improve the following deficits and impairments:  Abnormal gait, Difficulty walking, Decreased range of motion, Pain, Decreased activity tolerance, Decreased mobility, Decreased strength  Visit Diagnosis: Pain in left ankle and joints of left foot  Decreased ROM of left foot  Muscle weakness (generalized)  Difficulty in walking, not elsewhere classified  Other abnormalities of gait and mobility     Problem List Patient Active Problem List   Diagnosis Date Noted   Lisfranc dislocation, left, sequela    Vertigo 10/28/2017    GERD (gastroesophageal reflux disease) 06/15/2017   Circadian rhythm sleep disorder 04/30/2017   OSA (obstructive sleep apnea) 04/30/2017   Vitamin D deficiency 04/16/2016   Foot drop, right 03/26/2016   Anxiety    Myotonic dystrophy ([place])    Fibromyalgia    Migraine    WEIGHT LOSS 07/11/2008   HEADACHE 07/11/2008   NICOTINE ADDICTION 10/20/2007   MYOTONIC MUSCULAR DYSTROPHY 09/24/2007   MYALGIA/MYOSITIS NOS 08/03/2006   Depression 06/23/2006   Gwendolyn Grant, PT, DPT, ATC 11/27/20 3:31 PM   [facility] [address] [place], [place], [address] Phone: [phone]   Fax:  [phone]  Name: Valerie Friedman MRN: [id number] Date of Birth: 1976/01/02

## 2020-11-29 ENCOUNTER — Ambulatory Visit: Payer: 59

## 2020-11-29 ENCOUNTER — Other Ambulatory Visit: Payer: Self-pay

## 2020-11-29 DIAGNOSIS — S93325S Dislocation of tarsometatarsal joint of left foot, sequela: Secondary | ICD-10-CM

## 2020-11-29 DIAGNOSIS — M25572 Pain in left ankle and joints of left foot: Secondary | ICD-10-CM

## 2020-11-29 DIAGNOSIS — R2689 Other abnormalities of gait and mobility: Secondary | ICD-10-CM

## 2020-11-29 DIAGNOSIS — M25675 Stiffness of left foot, not elsewhere classified: Secondary | ICD-10-CM

## 2020-11-29 DIAGNOSIS — Z9889 Other specified postprocedural states: Secondary | ICD-10-CM | POA: Diagnosis not present

## 2020-11-29 DIAGNOSIS — Z8781 Personal history of (healed) traumatic fracture: Secondary | ICD-10-CM

## 2020-11-29 DIAGNOSIS — M6281 Muscle weakness (generalized): Secondary | ICD-10-CM

## 2020-11-29 DIAGNOSIS — R262 Difficulty in walking, not elsewhere classified: Secondary | ICD-10-CM

## 2020-11-29 NOTE — Therapy (Signed)
[place], [place], [address] Phone: [phone]   Fax:  [phone]  Physical Therapy Treatment  Patient Details  Name: Valerie Friedman MRN: [id number] Date of Birth: 1975-04-25 Referring Provider (PT): [name], MD   Encounter Date: 11/29/2020   PT End of Session - 11/29/20 1531     Visit Number 8    Number of Visits 17    Date for PT Re-Evaluation 12/29/20    Authorization Type [employer] DUAL COMPLETE; Medicaid [place] Access    PT Start Time 1505    PT Stop Time 1549    PT Time Calculation (min) 44 min    Activity Tolerance Patient tolerated treatment well    Behavior During Therapy [facility] for tasks assessed/performed             Past Medical History:  Diagnosis Date   Anemia    Anxiety    Arthritis    Cataracts, bilateral    MD just watching   Chronic lower back pain DUE TO MYOTONIC DYSTROPHY   Complication of anesthesia    slow to awaken , respirations either slow or fast.   Depression    patient denies this dx,  Therapist said that patient that she was fine after 2 years- ~2000   Endometriosis    Family history of adverse reaction to anesthesia 2022   brother was discharged after surgery had to be  taken back to hospital  CO2 was elevated, he was in the hospital for 9 days.   GERD (gastroesophageal reflux disease)    diet controlled   History of right foot drop    Migraine    Myotonic dystrophy ([place]) DX JULY 2009  ----- TYPE 1   FOLLOWED BY DR. LOVE   Numbness of fingers BILATERAL HANDS/  OCCASIONLLY RADIATES UP ARMS   Pelvic pain in female    Pneumonia    Seasonal allergies    Sleep apnea    does not use cpap   Vertigo    no current problems in the last year   Vitamin D deficiency     Past Surgical History:  Procedure Laterality Date   BENIGN BREAST BX   FEB  2011   BREAST BIOPSY Right 2011   Benign Stereo    LAPAROSCOPY  05/01/2011   Procedure:  LAPAROSCOPY DIAGNOSTIC;  Surgeon: Bennetta Laos, MD;  Location: [facility];  Service: Gynecology;  Laterality: N/A;  with bilateral chromopertubation, aspiration of functional left ovarian cyst, excision of two hydatid cysts   MUSCLE BIOPSY  02-25-2007   LEFT THIGH QUADRICEP BX X3  FOR MYOSITIS   OPEN REDUCTION INTERNAL FIXATION (ORIF) FOOT LISFRANC FRACTURE Left 09/05/2020   Procedure: OPEN REDUCTION INTERNAL FIXATION (ORIF) LEFT FOOT LISFRANC FRACTURE DISLOCATION;  Surgeon: [name], MD;  Location: [place];  Service: Orthopedics;  Laterality: Left;   SURG. FOR REMOVAL OF TEETH FRAGMENTS    MARCH 2012   ORAL SURGEON OFFICE   WISDOM TOOTH EXTRACTION      There were no vitals filed for this visit.   Subjective Assessment - 11/29/20 1526     Subjective Pt reports having to walk alot prior to PT to catch her dog who got outside and requaest not to complet the bike or treadmill    Pertinent History ORIF s/p Lisfranc dislocation; Myotonic dystrophy; L GT injury s/p fall with falling OOB 10/9; anxiety    Diagnostic tests X-ray 10/23/20:Graphs of the left  great toe are negative.  No sign of fracture. X-ray 10/16/20:Three-view radiographs of the left foot shows stable internal fixation   without lucency without loss of reduction.    Currently in Pain? Yes    Pain Score 0-No pain    Pain Location Foot    Pain Orientation Left              [facility] Adult PT Treatment/Exercise:   Therapeutic Exercise: - Calf stretch on wedge, Both, for gastroc and soleus, 30 sec, 2x each - DL standing calf raise (attempted, unable to complete due to weakness) - resisted plantarflexion 3 x 10 black band L and R - Marble pick up 2 set x 15 marbles seated - Wall slides x10 at 65d knee flexion, legs gave on 7th rep to full knee flexion. Pt was assisted with standing and reported no issues. - Calf raise on leg press 2 x 15 @ 20 lbs (minimal partial range)  - Leg press 2 x 10 @ 60 lbs     Manual Therapy: - N/A   Neuromuscular re-ed: - n/a   Therapeutic Activity: - n/a   Self-care/Home Management: - NA     Not completed:  - Lateral step ups to 2" airex, 2x10. (Decreased quad strength and quality of knee movement bilat) - Sit to/from standing, x10 (Decreased quad control concentrically and eccentrically)  - Ankle rockerboard fwd/bwd and lateral 1 min each - Towel scrunches 2 x 20. - 4 way ankle red Tband. X10, Medial deviation c PF and weak evertors     [facility] PT Assessment - 11/29/20 0001       AROM   Right Ankle Dorsiflexion 0    Left Ankle Dorsiflexion 0                                      PT Short Term Goals - 11/13/20 1707       PT SHORT TERM GOAL #1   Title Pt will be Ind with and initial HEP    Status Achieved    Target Date 11/13/20      PT SHORT TERM GOAL #2   Title Pt will voice understanding of measures to decrease L foot pain. 11/13/20: Pt is able to recall measures of elevation, meds, cold packs, adn wrapping with an ace bandage to manage pain    Status Achieved    Target Date 11/13/20               PT Long Term Goals - 11/01/20 1556       PT LONG TERM GOAL #1   Title Pt will be Ind in a final HEP to maintain achieved LOF    Status New    Target Date 12/29/20      PT LONG TERM GOAL #2   Title Increase biat ankle DF ROM to 0d (neutral) forimproved gait quality and to decrease strain of the L mid foot    Status New    Target Date 12/29/20      PT LONG TERM GOAL #3   Title Increase L toe flexion ROM to 75% of the R for improved L foot function with pushing off and balance    Status New    Target Date 12/29/20      PT LONG TERM GOAL #4   Title Increase L foot DF and Evervion strength to 5/5 for improved L foot function  and balance with gait    Status New    Target Date 12/29/20      PT LONG TERM GOAL #5   Title Pt will report a decrease in L foot pain to 4/10 or less for improved functional  mobility and QOL    Baseline 9/10    Status New    Target Date 12/29/20      Additional Long Term Goals   Additional Long Term Goals Yes      PT LONG TERM GOAL #6   Title Patient will ambulate at least 400 ft durign 2MWT test to signify improvements in gait speed.    Baseline 333 ft    Status New    Target Date 12/29/20                   Plan - 11/29/20 1539     Clinical Impression Statement PT was completed for strengthening of the B LEs with emphasis on the PFs and quads. The strength deficits of the PFs is significant, while the quads are of a more moderate deficit. DF ROM = 0d, so no additional progression there. Further improvement c DF ROM is quaestionable with pt's reported chronic history of issues with her LEs. Overall, pt is making appropriate functional progress. Pt tolerated the session without adverse effects.    Personal Factors and Comorbidities Comorbidity 3+    Comorbidities ORIF s/p Lisfranc dislocation; Myotonic dystrophy; L GT injury s/p fall with falling OOB 10/9; anxiety    Examination-Activity Limitations Locomotion Level;Transfers;Stand    Stability/Clinical Decision Making Evolving/Moderate complexity    Clinical Decision Making Moderate    Rehab Potential Good    PT Frequency 2x / week    PT Duration 8 weeks    PT Treatment/Interventions ADLs/Self Care Home Management;Iontophoresis 4mg /ml Dexamethasone;Moist Heat;Ultrasound;Balance training;Therapeutic exercise;Therapeutic activities;Functional mobility training;Stair training;Gait training;Patient/family education;Manual techniques;Dry needling;Taping;Vasopneumatic Device    PT Next Visit Plan check DF, gait training, ankle strengthening/ROM    PT Home Exercise Plan [id number]    Consulted and Agree with Plan of Care Patient             Patient will benefit from skilled therapeutic intervention in order to improve the following deficits and impairments:  Abnormal gait, Difficulty walking,  Decreased range of motion, Pain, Decreased activity tolerance, Decreased mobility, Decreased strength  Visit Diagnosis: Pain in left ankle and joints of left foot  Decreased ROM of left foot  Muscle weakness (generalized)  Difficulty in walking, not elsewhere classified  Other abnormalities of gait and mobility  Lisfranc's dislocation, left, sequela  S/P ORIF (open reduction internal fixation) fracture     Problem List Patient Active Problem List   Diagnosis Date Noted   Lisfranc dislocation, left, sequela    Vertigo 10/28/2017   GERD (gastroesophageal reflux disease) 06/15/2017   Circadian rhythm sleep disorder 04/30/2017   OSA (obstructive sleep apnea) 04/30/2017   Vitamin D deficiency 04/16/2016   Foot drop, right 03/26/2016   Anxiety    Myotonic dystrophy ([place])    Fibromyalgia    Migraine    WEIGHT LOSS 07/11/2008   HEADACHE 07/11/2008   NICOTINE ADDICTION 10/20/2007   MYOTONIC MUSCULAR DYSTROPHY 09/24/2007   MYALGIA/MYOSITIS NOS 08/03/2006   Depression 06/23/2006     Gar Ponto MS, PT 11/29/20 5:40 PM   [place] [facility] [address] [place], [place], [address] Phone: [phone]   Fax:  [phone]  Name: Valerie Friedman MRN: [id number] Date of Birth: Sep 22, 1975

## 2020-12-04 ENCOUNTER — Ambulatory Visit: Payer: 59

## 2020-12-04 ENCOUNTER — Other Ambulatory Visit: Payer: Self-pay

## 2020-12-04 DIAGNOSIS — Z9889 Other specified postprocedural states: Secondary | ICD-10-CM

## 2020-12-04 DIAGNOSIS — R2689 Other abnormalities of gait and mobility: Secondary | ICD-10-CM

## 2020-12-04 DIAGNOSIS — M25572 Pain in left ankle and joints of left foot: Secondary | ICD-10-CM

## 2020-12-04 DIAGNOSIS — S93325S Dislocation of tarsometatarsal joint of left foot, sequela: Secondary | ICD-10-CM

## 2020-12-04 DIAGNOSIS — Z8781 Personal history of (healed) traumatic fracture: Secondary | ICD-10-CM

## 2020-12-04 DIAGNOSIS — M25675 Stiffness of left foot, not elsewhere classified: Secondary | ICD-10-CM

## 2020-12-04 DIAGNOSIS — M6281 Muscle weakness (generalized): Secondary | ICD-10-CM

## 2020-12-04 DIAGNOSIS — R262 Difficulty in walking, not elsewhere classified: Secondary | ICD-10-CM

## 2020-12-04 NOTE — Therapy (Signed)
[place], [place], [address] Phone: [phone]   Fax:  [phone]  Physical Therapy Treatment  Patient Details  Name: Valerie Friedman MRN: [id number] Date of Birth: 1975/04/17 Referring Provider (PT): [name], MD   Encounter Date: 12/04/2020   PT End of Session - 12/04/20 1506     Visit Number 9    Number of Visits 17    Date for PT Re-Evaluation 12/29/20    Authorization Type [employer] DUAL COMPLETE; Medicaid [place] Access    PT Start Time 1505    PT Stop Time 1548    PT Time Calculation (min) 43 min    Activity Tolerance Patient tolerated treatment well    Behavior During Therapy [facility] for tasks assessed/performed             Past Medical History:  Diagnosis Date   Anemia    Anxiety    Arthritis    Cataracts, bilateral    MD just watching   Chronic lower back pain DUE TO MYOTONIC DYSTROPHY   Complication of anesthesia    slow to awaken , respirations either slow or fast.   Depression    patient denies this dx,  Therapist said that patient that she was fine after 2 years- ~2000   Endometriosis    Family history of adverse reaction to anesthesia 2022   brother was discharged after surgery had to be  taken back to hospital  CO2 was elevated, he was in the hospital for 9 days.   GERD (gastroesophageal reflux disease)    diet controlled   History of right foot drop    Migraine    Myotonic dystrophy ([place]) DX JULY 2009  ----- TYPE 1   FOLLOWED BY DR. LOVE   Numbness of fingers BILATERAL HANDS/  OCCASIONLLY RADIATES UP ARMS   Pelvic pain in female    Pneumonia    Seasonal allergies    Sleep apnea    does not use cpap   Vertigo    no current problems in the last year   Vitamin D deficiency     Past Surgical History:  Procedure Laterality Date   BENIGN BREAST BX   FEB  2011   BREAST BIOPSY Right 2011   Benign Stereo    LAPAROSCOPY  05/01/2011   Procedure:  LAPAROSCOPY DIAGNOSTIC;  Surgeon: Bennetta Laos, MD;  Location: [facility];  Service: Gynecology;  Laterality: N/A;  with bilateral chromopertubation, aspiration of functional left ovarian cyst, excision of two hydatid cysts   MUSCLE BIOPSY  02-25-2007   LEFT THIGH QUADRICEP BX X3  FOR MYOSITIS   OPEN REDUCTION INTERNAL FIXATION (ORIF) FOOT LISFRANC FRACTURE Left 09/05/2020   Procedure: OPEN REDUCTION INTERNAL FIXATION (ORIF) LEFT FOOT LISFRANC FRACTURE DISLOCATION;  Surgeon: [name], MD;  Location: [place];  Service: Orthopedics;  Laterality: Left;   SURG. FOR REMOVAL OF TEETH FRAGMENTS    MARCH 2012   ORAL SURGEON OFFICE   WISDOM TOOTH EXTRACTION      There were no vitals filed for this visit.  [place] Adult PT Treatment/Exercise:   Therapeutic Exercise: - Treadmill 5 mins, 1.0 and 1.2 mph for 2.5 mins each - Ankle PF long sit, 3x10, GTB, medial deviation, L and R - STS, 3x10, 10#, decreased quad control  - Calf stretch on wedge, Both, for gastroc and soleus, 30 sec, 2x each - LAQ, 2x10, 4#, L and R - Forward step downs, pt was  not able to complete due to limited DF ROM and quad weakness. - Lateral step ups, 6 inch, 1 hand assist, 2x10, L and R - DL standing calf raise (attempted, unable to complete due to weakness) - Resisted forward walking on theTreadmill 3x1 min.    Manual Therapy: - N/A   Neuromuscular re-ed: - n/a   Therapeutic Activity: - n/a   Self-care/Home Management: - NA   Not completed:   - Lateral step ups to 2" airex, 2x10. (Decreased quad strength and quality of knee movement bilat) - Sit to/from standing, x10 (Decreased quad control concentrically and eccentrically)  - Ankle rockerboard fwd/bwd and lateral 1 min each - Towel scrunches 2 x 20. - 4 way ankle red Tband. X10, Medial deviation c PF and weak evertors  - Marble pick up 2 set x 15 marbles seated - Wall slides x10 at 65d knee flexion, legs gave on 7th rep to full knee  flexion. Pt was assisted with standing and reported no issues. - Calf raise on leg press 2 x 15 @ 20 lbs (minimal partial range)  - Leg press 2 x 10 @ 60 lbs                                PT Short Term Goals - 11/13/20 1707       PT SHORT TERM GOAL #1   Title Pt will be Ind with and initial HEP    Status Achieved    Target Date 11/13/20      PT SHORT TERM GOAL #2   Title Pt will voice understanding of measures to decrease L foot pain. 11/13/20: Pt is able to recall measures of elevation, meds, cold packs, adn wrapping with an ace bandage to manage pain    Status Achieved    Target Date 11/13/20               PT Long Term Goals - 11/01/20 1556       PT LONG TERM GOAL #1   Title Pt will be Ind in a final HEP to maintain achieved LOF    Status New    Target Date 12/29/20      PT LONG TERM GOAL #2   Title Increase biat ankle DF ROM to 0d (neutral) forimproved gait quality and to decrease strain of the L mid foot    Status New    Target Date 12/29/20      PT LONG TERM GOAL #3   Title Increase L toe flexion ROM to 75% of the R for improved L foot function with pushing off and balance    Status New    Target Date 12/29/20      PT LONG TERM GOAL #4   Title Increase L foot DF and Evervion strength to 5/5 for improved L foot function and balance with gait    Status New    Target Date 12/29/20      PT LONG TERM GOAL #5   Title Pt will report a decrease in L foot pain to 4/10 or less for improved functional mobility and QOL    Baseline 9/10    Status New    Target Date 12/29/20      Additional Long Term Goals   Additional Long Term Goals Yes      PT LONG TERM GOAL #6   Title Patient will ambulate at least 400 ft durign 2MWT test to  signify improvements in gait speed.    Baseline 333 ft    Status New    Target Date 12/29/20                   Plan - 12/04/20 1506     Clinical Impression Statement PT was completed to increase  bilat ankle DF ROM and strengthening of the PFs, quads, and hip extensors to address functional mobility. Pt is not able to descend steps forward due to bilat decreased ankle DF ROM and quad strength. Attempted dsc off a 6" box and pt reported significant pulling of her achilles. Pt demonstrates decreased quad conrol with STS, and pt is not able to completea 2 footed heel raise. Pt's progress is slower than anticpated. Pt tolerated the session without adverse effects.    Personal Factors and Comorbidities Comorbidity 3+    Comorbidities ORIF s/p Lisfranc dislocation; Myotonic dystrophy; L GT injury s/p fall with falling OOB 10/9; anxiety    Examination-Activity Limitations Locomotion Level;Transfers;Stand    Stability/Clinical Decision Making Evolving/Moderate complexity    Clinical Decision Making Moderate    Rehab Potential Good    PT Frequency 2x / week    PT Duration 8 weeks    PT Treatment/Interventions ADLs/Self Care Home Management;Iontophoresis 4mg /ml Dexamethasone;Moist Heat;Ultrasound;Balance training;Therapeutic exercise;Therapeutic activities;Functional mobility training;Stair training;Gait training;Patient/family education;Manual techniques;Dry needling;Taping;Vasopneumatic Device    PT Next Visit Plan check DF, gait training, ankle strengthening/ROM    PT Home Exercise Plan [email or web address]    Consulted and Agree with Plan of Care Patient             Patient will benefit from skilled therapeutic intervention in order to improve the following deficits and impairments:  Abnormal gait, Difficulty walking, Decreased range of motion, Pain, Decreased activity tolerance, Decreased mobility, Decreased strength  Visit Diagnosis: Pain in left ankle and joints of left foot  Decreased ROM of left foot  Muscle weakness (generalized)  Difficulty in walking, not elsewhere classified  Other abnormalities of gait and mobility  Lisfranc's dislocation, left, sequela  S/P ORIF (open reduction  internal fixation) fracture     Problem List Patient Active Problem List   Diagnosis Date Noted   Lisfranc dislocation, left, sequela    Vertigo 10/28/2017   GERD (gastroesophageal reflux disease) 06/15/2017   Circadian rhythm sleep disorder 04/30/2017   OSA (obstructive sleep apnea) 04/30/2017   Vitamin D deficiency 04/16/2016   Foot drop, right 03/26/2016   Anxiety    Myotonic dystrophy ([place])    Fibromyalgia    Migraine    WEIGHT LOSS 07/11/2008   HEADACHE 07/11/2008   NICOTINE ADDICTION 10/20/2007   MYOTONIC MUSCULAR DYSTROPHY 09/24/2007   MYALGIA/MYOSITIS NOS 08/03/2006   Depression 06/23/2006   Gar Ponto MS, PT 12/04/20 5:10 PM   [place] [facility] [address] [place], [place], [address] Phone: [phone]   Fax:  [phone]  Name: AMORIE RENTZ MRN: [id number] Date of Birth: June 14, 1975

## 2020-12-11 ENCOUNTER — Ambulatory Visit: Payer: 59

## 2020-12-13 ENCOUNTER — Ambulatory Visit: Payer: 59 | Attending: Orthopedic Surgery

## 2020-12-13 ENCOUNTER — Other Ambulatory Visit: Payer: Self-pay

## 2020-12-13 DIAGNOSIS — M25572 Pain in left ankle and joints of left foot: Secondary | ICD-10-CM | POA: Diagnosis not present

## 2020-12-13 DIAGNOSIS — S93325S Dislocation of tarsometatarsal joint of left foot, sequela: Secondary | ICD-10-CM | POA: Insufficient documentation

## 2020-12-13 DIAGNOSIS — M25675 Stiffness of left foot, not elsewhere classified: Secondary | ICD-10-CM | POA: Diagnosis present

## 2020-12-13 DIAGNOSIS — M6281 Muscle weakness (generalized): Secondary | ICD-10-CM | POA: Diagnosis present

## 2020-12-13 DIAGNOSIS — Z9889 Other specified postprocedural states: Secondary | ICD-10-CM | POA: Diagnosis present

## 2020-12-13 DIAGNOSIS — R2689 Other abnormalities of gait and mobility: Secondary | ICD-10-CM | POA: Insufficient documentation

## 2020-12-13 DIAGNOSIS — R262 Difficulty in walking, not elsewhere classified: Secondary | ICD-10-CM | POA: Insufficient documentation

## 2020-12-13 DIAGNOSIS — Z8781 Personal history of (healed) traumatic fracture: Secondary | ICD-10-CM | POA: Diagnosis present

## 2020-12-13 NOTE — Therapy (Signed)
[place], [place], [address] Phone: [phone]   Fax:  [phone]  Physical Therapy Treatment Progress Note Reporting Period 10/23/20 to 12/13/20  See note below for Objective Data and Assessment of Progress/Goals.     Patient Details  Name: Valerie Friedman MRN: [id number] Date of Birth: 04-Jan-1976 Referring Provider (PT): [name], MD   Encounter Date: 12/13/2020   PT End of Session - 12/13/20 1739     Visit Number 10    Number of Visits 17    Date for PT Re-Evaluation 12/29/20    Authorization Type [employer] DUAL COMPLETE; Medicaid [place] Access    Progress Note Due on Visit 20    PT Start Time 1745    PT Stop Time 1827    PT Time Calculation (min) 42 min    Activity Tolerance Patient tolerated treatment well    Behavior During Therapy WFL for tasks assessed/performed             Past Medical History:  Diagnosis Date   Anemia    Anxiety    Arthritis    Cataracts, bilateral    MD just watching   Chronic lower back pain DUE TO MYOTONIC DYSTROPHY   Complication of anesthesia    slow to awaken , respirations either slow or fast.   Depression    patient denies this dx,  Therapist said that patient that she was fine after 2 years- ~2000   Endometriosis    Family history of adverse reaction to anesthesia 2022   brother was discharged after surgery had to be  taken back to hospital  CO2 was elevated, he was in the hospital for 9 days.   GERD (gastroesophageal reflux disease)    diet controlled   History of right foot drop    Migraine    Myotonic dystrophy ([place]) DX JULY 2009  ----- TYPE 1   FOLLOWED BY DR. LOVE   Numbness of fingers BILATERAL HANDS/  OCCASIONLLY RADIATES UP ARMS   Pelvic pain in female    Pneumonia    Seasonal allergies    Sleep apnea    does not use cpap   Vertigo    no current problems in the last year   Vitamin D deficiency     Past Surgical  History:  Procedure Laterality Date   BENIGN BREAST BX   FEB  2011   BREAST BIOPSY Right 2011   Benign Stereo    LAPAROSCOPY  05/01/2011   Procedure: LAPAROSCOPY DIAGNOSTIC;  Surgeon: Bennetta Laos, MD;  Location: [facility];  Service: Gynecology;  Laterality: N/A;  with bilateral chromopertubation, aspiration of functional left ovarian cyst, excision of two hydatid cysts   MUSCLE BIOPSY  02-25-2007   LEFT THIGH QUADRICEP BX X3  FOR MYOSITIS   OPEN REDUCTION INTERNAL FIXATION (ORIF) FOOT LISFRANC FRACTURE Left 09/05/2020   Procedure: OPEN REDUCTION INTERNAL FIXATION (ORIF) LEFT FOOT LISFRANC FRACTURE DISLOCATION;  Surgeon: [name], MD;  Location: [facility];  Service: Orthopedics;  Laterality: Left;   SURG. FOR REMOVAL OF TEETH FRAGMENTS    MARCH 2012   ORAL SURGEON OFFICE   WISDOM TOOTH EXTRACTION      There were no vitals filed for this visit.   Subjective Assessment - 12/13/20 1748     Subjective Patient reports she is feeling fine. She was able to complete some of her exercises while on vacation. Going down the stairs is still difficult due  to lack of motion in the ankle.    Pertinent History ORIF s/p Lisfranc dislocation; Myotonic dystrophy; L GT injury s/p fall with falling OOB 10/9; anxiety    Diagnostic tests X-ray 10/23/20:Graphs of the left great toe are negative.  No sign of fracture. X-ray 10/16/20:Three-view radiographs of the left foot shows stable internal fixation   without lucency without loss of reduction.    Currently in Pain? No/denies                [facility] PT Assessment - 12/13/20 0001       Observation/Other Assessments   Focus on Therapeutic Outcomes (FOTO)  60% function      AROM   Overall AROM Comments 35 degrees great toe flexion Lt; 45 degrees Rt great toe flexion    Right Ankle Dorsiflexion 0    Left Ankle Dorsiflexion 0      Strength   Left Ankle Dorsiflexion 4-/5    Left Ankle Inversion 5/5    Left Ankle Eversion 5/5       Ambulation/Gait   Gait Comments 2MWT 380 ft; no rest breaks            [facility] Adult PT Treatment/Exercise:   Therapeutic Exercise: - 2 MWT  - resisted ankle dorsiflexion blue band 2 x 10 LLE  - ankle rockerboard A/P 2 x10 partial range  - ankle rockerboard lateral 2 x 10   - stool scoots 2 x 30 ft   Not completed:  - Ankle PF long sit, 3x10, GTB, medial deviation, L and R - STS, 3x10, 10#, decreased quad control  - Calf stretch on wedge, Both, for gastroc and soleus, 30 sec, 2x each - LAQ, 2x10, 4#, L and R - Forward step downs, pt was not able to complete due to limited DF ROM and quad weakness. - Lateral step ups, 6 inch, 1 hand assist, 2x10, L and R - DL standing calf raise (attempted, unable to complete due to weakness) - Resisted forward walking on theTreadmill 3x1 min.  - Sit to/from standing, x10 (Decreased quad control concentrically and eccentrically)  - Ankle rockerboard fwd/bwd and lateral 1 min each - Towel scrunches 2 x 20. - 4 way ankle red Tband. X10, Medial deviation c PF and weak evertors  - Marble pick up 2 set x 15 marbles seated - Wall slides x10 at 65d knee flexion, legs gave on 7th rep to full knee flexion. Pt was assisted with standing and reported no issues. - Calf raise on leg press 2 x 15 @ 20 lbs (minimal partial range)  - Leg press 2 x 10 @ 60 lbs   Neuromuscular re-ed: - tandem walks 2 x 15 ft  - romberg with ball toss to rebounder 2 x 10    Therapeutic Activity: - education on re-assessment findings as it relates to progress towards functional goals discussing ongoing impairments that will continue to be addressed in rehab.                                        PT Short Term Goals - 12/13/20 1740       PT SHORT TERM GOAL #1   Title Pt will be Ind with and initial HEP    Status Achieved    Target Date 11/13/20      PT SHORT TERM GOAL #2   Title Pt will voice understanding of  measures to decrease L foot  pain. 11/13/20: Pt is able to recall measures of elevation, meds, cold packs, adn wrapping with an ace bandage to manage pain    Status Achieved    Target Date 11/13/20               PT Long Term Goals - 12/13/20 1740       PT LONG TERM GOAL #1   Title Pt will be Ind in a final HEP to maintain achieved LOF    Baseline progressing HEP is appropriate    Status On-going      PT LONG TERM GOAL #2   Title Increase biat ankle DF ROM to 0d (neutral) forimproved gait quality and to decrease strain of the L mid foot    Baseline see flowsheet    Status Achieved      PT LONG TERM GOAL #3   Title Increase L toe flexion ROM to 75% of the R for improved L foot function with pushing off and balance    Baseline see flowsheet    Status Achieved      PT LONG TERM GOAL #4   Title Increase L foot DF and Evervion strength to 5/5 for improved L foot function and balance with gait    Baseline see flowsheet    Status Partially Met      PT LONG TERM GOAL #5   Title Pt will report a decrease in L foot pain to 4/10 or less for improved functional mobility and QOL    Baseline reports average pain of 1-2/10 over the past few days    Status Achieved      PT LONG TERM GOAL #6   Title Patient will ambulate at least 400 ft durign 2MWT test to signify improvements in gait speed.    Baseline see flowsheet    Status On-going                   Plan - 12/13/20 1741     Clinical Impression Statement Elise is making gradual functional progress towards established goals. She has met all short term functional goals and 3/6 long term functional goals. She demonstrates improved toe and ankle AROM, ankle eversion strength, gait speed, and an overall reduction in pain. She continues to demonstrate ankle dorsiflexor and plantarflexor weakness and demonstrates balance impairments as noted through occasional LOB with dynamic balance activity performed during tonight's session. She will benefit from  continuing with current POC to address ongoing strength, ROM, and balance impairments in order to optimize her function.    Personal Factors and Comorbidities Comorbidity 3+    Comorbidities ORIF s/p Lisfranc dislocation; Myotonic dystrophy; L GT injury s/p fall with falling OOB 10/9; anxiety    Examination-Activity Limitations Locomotion Level;Transfers;Stand    Stability/Clinical Decision Making Evolving/Moderate complexity    Rehab Potential Good    PT Frequency 2x / week    PT Duration 8 weeks    PT Treatment/Interventions ADLs/Self Care Home Management;Iontophoresis 7m/ml Dexamethasone;Moist Heat;Ultrasound;Balance training;Therapeutic exercise;Therapeutic activities;Functional mobility training;Stair training;Gait training;Patient/family education;Manual techniques;Dry needling;Taping;Vasopneumatic Device    PT Next Visit Plan check DF, gait training, ankle strengthening/ROM    PT Home Exercise Plan [email or web address]    Consulted and Agree with Plan of Care Patient             Patient will benefit from skilled therapeutic intervention in order to improve the following deficits and impairments:  Abnormal gait, Difficulty walking, Decreased range of motion, Pain, Decreased activity  tolerance, Decreased mobility, Decreased strength  Visit Diagnosis: Pain in left ankle and joints of left foot  Decreased ROM of left foot  Muscle weakness (generalized)  Difficulty in walking, not elsewhere classified  Other abnormalities of gait and mobility  Lisfranc's dislocation, left, sequela  S/P ORIF (open reduction internal fixation) fracture     Problem List Patient Active Problem List   Diagnosis Date Noted   Lisfranc dislocation, left, sequela    Vertigo 10/28/2017   GERD (gastroesophageal reflux disease) 06/15/2017   Circadian rhythm sleep disorder 04/30/2017   OSA (obstructive sleep apnea) 04/30/2017   Vitamin D deficiency 04/16/2016   Foot drop, right 03/26/2016   Anxiety     Myotonic dystrophy ([place])    Fibromyalgia    Migraine    WEIGHT LOSS 07/11/2008   HEADACHE 07/11/2008   NICOTINE ADDICTION 10/20/2007   MYOTONIC MUSCULAR DYSTROPHY 09/24/2007   MYALGIA/MYOSITIS NOS 08/03/2006   Depression 06/23/2006   Gwendolyn Grant, PT, DPT, ATC 12/13/20 6:47 PM  [facility] [address] [place], [place], [address] Phone: [phone]   Fax:  [phone]  Name: ANNISTON NELLUMS MRN: [id number] Date of Birth: [birth year]

## 2020-12-18 ENCOUNTER — Other Ambulatory Visit: Payer: Self-pay

## 2020-12-18 ENCOUNTER — Ambulatory Visit: Payer: 59

## 2020-12-18 DIAGNOSIS — M25572 Pain in left ankle and joints of left foot: Secondary | ICD-10-CM | POA: Diagnosis not present

## 2020-12-18 DIAGNOSIS — R262 Difficulty in walking, not elsewhere classified: Secondary | ICD-10-CM

## 2020-12-18 DIAGNOSIS — M6281 Muscle weakness (generalized): Secondary | ICD-10-CM

## 2020-12-18 DIAGNOSIS — M25675 Stiffness of left foot, not elsewhere classified: Secondary | ICD-10-CM

## 2020-12-18 DIAGNOSIS — R2689 Other abnormalities of gait and mobility: Secondary | ICD-10-CM

## 2020-12-18 NOTE — Therapy (Addendum)
Valerie Friedman, [place], [address] Phone: [phone]   Fax:  [phone]  Physical Therapy Treatment  Patient Details  Name: Valerie Friedman MRN: [id number] Date of Birth: Aug 25, 1975 Referring Provider (PT): [name], MD   Encounter Date: 12/18/2020   PT End of Session - 12/18/20 1815     Visit Number 11    Number of Visits 17    Date for PT Re-Evaluation 12/29/20    Authorization Type UNITEDHE[employer] DUAL COMPLETE; Medicaid [place] Access    Progress Note Due on Visit 20    PT Start Time 1625   session limited due to patient being late   PT Stop Time 1700    PT Time Calculation (min) 35 min    Activity Tolerance Patient tolerated treatment well    Behavior During Therapy WFL for tasks assessed/performed             Past Medical History:  Diagnosis Date   Anemia    Anxiety    Arthritis    Cataracts, bilateral    MD just watching   Chronic lower back pain DUE TO MYOTONIC DYSTROPHY   Complication of anesthesia    slow to awaken , respirations either slow or fast.   Depression    patient denies this dx,  Therapist said that patient that she was fine after 2 years- ~2000   Endometriosis    Family history of adverse reaction to anesthesia 2022   brother was discharged after surgery had to be  taken back to hospital  CO2 was elevated, he was in the hospital for 9 days.   GERD (gastroesophageal reflux disease)    diet controlled   History of right foot drop    Migraine    Myotonic dystrophy ([place]) DX JULY 2009  ----- TYPE 1   FOLLOWED BY DR. LOVE   Numbness of fingers BILATERAL HANDS/  OCCASIONLLY RADIATES UP ARMS   Pelvic pain in female    Pneumonia    Seasonal allergies    Sleep apnea    does not use cpap   Vertigo    no current problems in the last year   Vitamin D deficiency     Past Surgical History:  Procedure Laterality Date   BENIGN BREAST BX   FEB  2011   BREAST BIOPSY  Right 2011   Benign Stereo    LAPAROSCOPY  05/01/2011   Procedure: LAPAROSCOPY DIAGNOSTIC;  Surgeon: Valerie Laos, MD;  Location: [facility];  Service: Gynecology;  Laterality: N/A;  with bilateral chromopertubation, aspiration of functional left ovarian cyst, excision of two hydatid cysts   MUSCLE BIOPSY  02-25-2007   LEFT THIGH QUADRICEP BX X3  FOR MYOSITIS   OPEN REDUCTION INTERNAL FIXATION (ORIF) FOOT LISFRANC FRACTURE Left 09/05/2020   Procedure: OPEN REDUCTION INTERNAL FIXATION (ORIF) LEFT FOOT LISFRANC FRACTURE DISLOCATION;  Surgeon: [name], MD;  Location: [facility];  Service: Orthopedics;  Laterality: Left;   SURG. FOR REMOVAL OF TEETH FRAGMENTS    MARCH 2012   ORAL SURGEON OFFICE   WISDOM TOOTH EXTRACTION      There were no vitals filed for this visit.   Subjective Assessment - 12/18/20 1629     Subjective Pt says she was able to go home after last session and did not need a nap.    Pertinent History ORIF s/p Lisfranc dislocation; Myotonic dystrophy; L GT injury s/p fall with falling OOB 10/9; anxiety  Diagnostic tests X-ray 10/23/20:Graphs of the left great toe are negative.  No sign of fracture. X-ray 10/16/20:Three-view radiographs of the left foot shows stable internal fixation   without lucency without loss of reduction.    Currently in Pain? No/denies               [facility] Adult PT Treatment/Exercise:   Therapeutic Exercise: - Treadmill 4 minutes, 10% grade, 1.56mh - DF/PF rockerboard 3x182m w/ UE support  - Calf raise at leg press partial ROM 20lbs attempted, but pt unable due to pain  - lateral step downs from 2inch and 4 inch step 2x12  - forward step downs from 2inch 2x8    Not completed:  - Ankle PF long sit, 3x10, GTB, medial deviation, L and R - STS, 3x10, 10#, decreased quad control  - Calf stretch on wedge, Both, for gastroc and soleus, 30 sec, 2x each - LAQ, 2x10, 4#, L and R - Forward step downs, pt was not able to complete  due to limited DF ROM and quad weakness. - Lateral step ups, 6 inch, 1 hand assist, 2x10, L and R - DL standing calf raise (attempted, unable to complete due to weakness) - Resisted forward walking on theTreadmill 3x1 min.  - Sit to/from standing, x10 (Decreased quad control concentrically and eccentrically) - Ankle rockerboard fwd/bwd and lateral 1 min each - Towel scrunches 2 x 20. - 4 way ankle red Tband. X10, Medial deviation c PF and weak evertors  - Marble pick up 2 set x 15 marbles seated - Wall slides x10 at 65d knee flexion, legs gave on 7th rep to full knee flexion. Pt was assisted with standing and reported no issues. - Calf raise on leg press 2 x 15 @ 20 lbs (minimal partial range)  - Leg press 2 x 10 @ 60 lbs    Neuromuscular re-ed: - double leg stance with green ball toss, 4x8  - tandem stance with green ball toss bilat 2x10  - double leg stance 3x30secs EC on airex    Therapeutic Activity: - NA                                                                 PT Short Term Goals - 12/13/20 1740       PT SHORT TERM GOAL #1   Title Pt will be Ind with and initial HEP    Status Achieved    Target Date 11/13/20      PT SHORT TERM GOAL #2   Title Pt will voice understanding of measures to decrease L foot pain. 11/13/20: Pt is able to recall measures of elevation, meds, cold packs, adn wrapping with an ace bandage to manage pain    Status Achieved    Target Date 11/13/20               PT Long Term Goals - 12/13/20 1740       PT LONG TERM GOAL #1   Title Pt will be Ind in a final HEP to maintain achieved LOF    Baseline progressing HEP is appropriate    Status On-going      PT LONG TERM GOAL #2   Title Increase biat ankle DF ROM to 0d (neutral) forimproved gait  quality and to decrease strain of the L mid foot    Baseline see flowsheet    Status Achieved      PT LONG TERM GOAL #3   Title Increase L toe flexion ROM  to 75% of the R for improved L foot function with pushing off and balance    Baseline see flowsheet    Status Achieved      PT LONG TERM GOAL #4   Title Increase L foot DF and Evervion strength to 5/5 for improved L foot function and balance with gait    Baseline see flowsheet    Status Partially Met      PT LONG TERM GOAL #5   Title Pt will report a decrease in L foot pain to 4/10 or less for improved functional mobility and QOL    Baseline reports average pain of 1-2/10 over the past few days    Status Achieved      PT LONG TERM GOAL #6   Title Patient will ambulate at least 400 ft durign 2MWT test to signify improvements in gait speed.    Baseline see flowsheet    Status On-going                   Plan - 12/18/20 1817     Clinical Impression Statement Pt tolerated todays treatment session well which focused on balance and CKC strengthening. Pt reported increased pain with forward step downs that decreased at rest. Pt required moderate verbal cues throughout session for form and technique especially for increasing forward weight shift during balance activities. Pt reported no increased pain at the end of session.    Personal Factors and Comorbidities Comorbidity 3+    Comorbidities ORIF s/p Lisfranc dislocation; Myotonic dystrophy; L GT injury s/p fall with falling OOB 10/9; anxiety    Examination-Activity Limitations Locomotion Level;Transfers;Stand    PT Treatment/Interventions ADLs/Self Care Home Management;Iontophoresis 29m/ml Dexamethasone;Moist Heat;Ultrasound;Balance training;Therapeutic exercise;Therapeutic activities;Functional mobility training;Stair training;Gait training;Patient/family education;Manual techniques;Dry needling;Taping;Vasopneumatic Device    PT Next Visit Plan check DF, gait training, ankle strengthening/ROM    PT Home Exercise Plan [email or web address]    Consulted and Agree with Plan of Care Patient             Patient will benefit from skilled  therapeutic intervention in order to improve the following deficits and impairments:  Abnormal gait, Difficulty walking, Decreased range of motion, Pain, Decreased activity tolerance, Decreased mobility, Decreased strength  Visit Diagnosis: No diagnosis found.     Problem List Patient Active Problem List   Diagnosis Date Noted   Lisfranc dislocation, left, sequela    Vertigo 10/28/2017   GERD (gastroesophageal reflux disease) 06/15/2017   Circadian rhythm sleep disorder 04/30/2017   OSA (obstructive sleep apnea) 04/30/2017   Vitamin D deficiency 04/16/2016   Foot drop, right 03/26/2016   Anxiety    [place]    Fibromyalgia    Migraine    WEIGHT LOSS 07/11/2008   HEADACHE 07/11/2008   NICOTINE ADDICTION 10/20/2007   MYOTONIC MUSCULAR DYSTROPHY 09/24/2007   MYALGIA/MYOSITIS NOS 08/03/2006   Depression 06/23/2006    AGlade Lloyd SPT 12/18/20 6:39 PM   CF[facility] HawkCEast[facility] LLC[address] [address]Phone: [phone]  Fax:  [phone] Name: IALLISYN KUNZMRN: [id number] of Birth: 109-29-1977

## 2020-12-20 ENCOUNTER — Ambulatory Visit: Payer: 59

## 2020-12-20 ENCOUNTER — Other Ambulatory Visit: Payer: Self-pay

## 2020-12-20 DIAGNOSIS — M25572 Pain in left ankle and joints of left foot: Secondary | ICD-10-CM

## 2020-12-20 DIAGNOSIS — M25675 Stiffness of left foot, not elsewhere classified: Secondary | ICD-10-CM

## 2020-12-20 DIAGNOSIS — R262 Difficulty in walking, not elsewhere classified: Secondary | ICD-10-CM

## 2020-12-20 DIAGNOSIS — M6281 Muscle weakness (generalized): Secondary | ICD-10-CM

## 2020-12-20 DIAGNOSIS — R2689 Other abnormalities of gait and mobility: Secondary | ICD-10-CM

## 2020-12-20 NOTE — Therapy (Addendum)
[place], [place], [address] Phone: [phone]   Fax:  [phone]  Physical Therapy Treatment  Patient Details  Name: Valerie Friedman MRN: [id number] Date of Birth: June 25, 1975 Referring Provider (PT): [name], MD   Encounter Date: 12/20/2020   PT End of Session - 12/20/20 1624     Visit Number 12    Number of Visits 17    Date for PT Re-Evaluation 12/29/20    Authorization Type [employer] DUAL COMPLETE; Medicaid [place] Access    Progress Note Due on Visit 20    PT Start Time 1616    PT Stop Time 1700    PT Time Calculation (min) 44 min    Activity Tolerance Patient tolerated treatment well    Behavior During Therapy WFL for tasks assessed/performed             Past Medical History:  Diagnosis Date   Anemia    Anxiety    Arthritis    Cataracts, bilateral    MD just watching   Chronic lower back pain DUE TO MYOTONIC DYSTROPHY   Complication of anesthesia    slow to awaken , respirations either slow or fast.   Depression    patient denies this dx,  Therapist said that patient that she was fine after 2 years- ~2000   Endometriosis    Family history of adverse reaction to anesthesia 2022   brother was discharged after surgery had to be  taken back to hospital  CO2 was elevated, he was in the hospital for 9 days.   GERD (gastroesophageal reflux disease)    diet controlled   History of right foot drop    Migraine    Myotonic dystrophy ([place]) DX JULY 2009  ----- TYPE 1   FOLLOWED BY DR. LOVE   Numbness of fingers BILATERAL HANDS/  OCCASIONLLY RADIATES UP ARMS   Pelvic pain in female    Pneumonia    Seasonal allergies    Sleep apnea    does not use cpap   Vertigo    no current problems in the last year   Vitamin D deficiency     Past Surgical History:  Procedure Laterality Date   BENIGN BREAST BX   FEB  2011   BREAST BIOPSY Right 2011   Benign Stereo    LAPAROSCOPY   05/01/2011   Procedure: LAPAROSCOPY DIAGNOSTIC;  Surgeon: Bennetta Laos, MD;  Location: [facility];  Service: Gynecology;  Laterality: N/A;  with bilateral chromopertubation, aspiration of functional left ovarian cyst, excision of two hydatid cysts   MUSCLE BIOPSY  02-25-2007   LEFT THIGH QUADRICEP BX X3  FOR MYOSITIS   OPEN REDUCTION INTERNAL FIXATION (ORIF) FOOT LISFRANC FRACTURE Left 09/05/2020   Procedure: OPEN REDUCTION INTERNAL FIXATION (ORIF) LEFT FOOT LISFRANC FRACTURE DISLOCATION;  Surgeon: [name], MD;  Location: [facility];  Service: Orthopedics;  Laterality: Left;   SURG. FOR REMOVAL OF TEETH FRAGMENTS    MARCH 2012   ORAL SURGEON OFFICE   WISDOM TOOTH EXTRACTION      There were no vitals filed for this visit.   Subjective Assessment - 12/20/20 1619     Subjective Pt says that she has "tightness" in her Lt achilles, but no reports of pain.    Pertinent History ORIF s/p Lisfranc dislocation; Myotonic dystrophy; L GT injury s/p fall with falling OOB 10/9; anxiety    Limitations Standing;Walking;House hold activities    Diagnostic  tests X-ray 10/23/20:Graphs of the left great toe are negative.  No sign of fracture. X-ray 10/16/20:Three-view radiographs of the left foot shows stable internal fixation   without lucency without loss of reduction.    Currently in Pain? No/denies             [facility] Adult PT Treatment/Exercise:   Therapeutic Exercise: - rolling stool scoots around clinic 2 laps  - Calf stretch on wedge, Both, for gastroc and soleus, 30 sec, 2x each - lunge on mat table w/o UE support 2x10 each leg  - step downs from 2inch forward 1x10 each, 4 inch forward 1x10 each, 6inch lateral steps downs 1x10 each, 8 inch 1x5 forward.  - 2MWT 322f  - wall squat slides 1x4 minutes    Not completed:  - Ankle PF long sit, 3x10, GTB, medial deviation, L and R - STS, 3x10, 10#, decreased quad control  - LAQ, 2x10, 4#, L and R - Forward step downs, pt was  not able to complete due to limited DF ROM and quad weakness. - Lateral step ups, 6 inch, 1 hand assist, 2x10, L and R - DL standing calf raise (attempted, unable to complete due to weakness) - Resisted forward walking on theTreadmill 3x1 min.  - Sit to/from standing, x10 (Decreased quad control concentrically and eccentrically) - Ankle rockerboard fwd/bwd and lateral 1 min each - Towel scrunches 2 x 20. - 4 way ankle red Tband. X10, Medial deviation c PF and weak evertors  - Marble pick up 2 set x 15 marbles seated - Wall slides x10 at 65d knee flexion, legs gave on 7th rep to full knee flexion. Pt was assisted with standing and reported no issues. - Calf raise on leg press 2 x 15 @ 20 lbs (minimal partial range)  - Leg press 2 x 10 @ 60 lbs    Neuromuscular re-ed: - balance on BOSU ball side 2x30secs double leg MinA from UE support  - double leg balance on black side of BOSU, 1x30secs minimal UE support    Therapeutic Activity: - NA                              [facility] PT Assessment - 12/20/20 0001       Ambulation/Gait   Gait Comments [id number]; no rest breaks needed                                      PT Short Term Goals - 12/13/20 1740       PT SHORT TERM GOAL #1   Title Pt will be Ind with and initial HEP    Status Achieved    Target Date 11/13/20      PT SHORT TERM GOAL #2   Title Pt will voice understanding of measures to decrease L foot pain. 11/13/20: Pt is able to recall measures of elevation, meds, cold packs, adn wrapping with an ace bandage to manage pain    Status Achieved    Target Date 11/13/20               PT Long Term Goals - 12/13/20 1740       PT LONG TERM GOAL #1   Title Pt will be Ind in a final HEP to maintain achieved LOF    Baseline progressing HEP is appropriate  Status On-going      PT LONG TERM GOAL #2   Title Increase biat ankle DF ROM to 0d (neutral) forimproved gait quality and to  decrease strain of the L mid foot    Baseline see flowsheet    Status Achieved      PT LONG TERM GOAL #3   Title Increase L toe flexion ROM to 75% of the R for improved L foot function with pushing off and balance    Baseline see flowsheet    Status Achieved      PT LONG TERM GOAL #4   Title Increase L foot DF and Evervion strength to 5/5 for improved L foot function and balance with gait    Baseline see flowsheet    Status Partially Met      PT LONG TERM GOAL #5   Title Pt will report a decrease in L foot pain to 4/10 or less for improved functional mobility and QOL    Baseline reports average pain of 1-2/10 over the past few days    Status Achieved      PT LONG TERM GOAL #6   Title Patient will ambulate at least 400 ft durign 2MWT test to signify improvements in gait speed.    Baseline see flowsheet    Status On-going                   Plan - 12/20/20 1709     Clinical Impression Statement Pt tolerated todays treatment session well which focused on balance, eccentric quad control, and ankle mobility. Pt had significant difficulty controlling hyperextension of the knee throughout therex, but was able to complete step downs with less pain than previous treatments. No significant change in 2MWT noted this session, but pt noted she could've walked faster. Pt required heavy verbal cues for controlling hyperextension of the knee during CKC strengthening. Pt able to correct form with constant cues. Pt reports moderate fatigue at the end of session.    Personal Factors and Comorbidities Comorbidity 3+    Comorbidities ORIF s/p Lisfranc dislocation; Myotonic dystrophy; L GT injury s/p fall with falling OOB 10/9; anxiety    Examination-Activity Limitations Locomotion Level;Transfers;Stand    PT Treatment/Interventions ADLs/Self Care Home Management;Iontophoresis 44m/ml Dexamethasone;Moist Heat;Ultrasound;Balance training;Therapeutic exercise;Therapeutic activities;Functional mobility  training;Stair training;Gait training;Patient/family education;Manual techniques;Dry needling;Taping;Vasopneumatic Device    PT Next Visit Plan check DF, gait training, ankle strengthening/ROM    PT Home Exercise Plan [id number]    Consulted and Agree with Plan of Care Patient             Patient will benefit from skilled therapeutic intervention in order to improve the following deficits and impairments:  Abnormal gait, Difficulty walking, Decreased range of motion, Pain, Decreased activity tolerance, Decreased mobility, Decreased strength  Visit Diagnosis: No diagnosis found.     Problem List Patient Active Problem List   Diagnosis Date Noted   Lisfranc dislocation, left, sequela    Vertigo 10/28/2017   GERD (gastroesophageal reflux disease) 06/15/2017   Circadian rhythm sleep disorder 04/30/2017   OSA (obstructive sleep apnea) 04/30/2017   Vitamin D deficiency 04/16/2016   Foot drop, right 03/26/2016   Anxiety    Myotonic dystrophy ([place]    Fibromyalgia    Migraine    WEIGHT LOSS 07/11/2008   HEADACHE 07/11/2008   NICOTINE ADDICTION 10/20/2007   MYOTONIC MUSCULAR DYSTROPHY 09/24/2007   MYALGIA/MYOSITIS NOS 08/03/2006   Depression 06/23/2006    AGlade Lloyd SPT 12/20/20 5:22 PM  [place], [place], [address] Phone: [phone]   Fax:  [phone]  Name: Valerie Friedman MRN: [id number] Date of Birth: November 06, 1975

## 2020-12-25 ENCOUNTER — Ambulatory Visit: Payer: 59

## 2020-12-27 ENCOUNTER — Ambulatory Visit: Payer: 59

## 2020-12-28 ENCOUNTER — Telehealth: Payer: Self-pay

## 2020-12-28 NOTE — Telephone Encounter (Signed)
LVM re: no show visit on 12/27/20. Reminded pt of upcoming appt. on 01/01/21.

## 2021-01-01 ENCOUNTER — Ambulatory Visit: Payer: 59

## 2021-01-01 ENCOUNTER — Other Ambulatory Visit: Payer: Self-pay

## 2021-01-01 DIAGNOSIS — M25675 Stiffness of left foot, not elsewhere classified: Secondary | ICD-10-CM

## 2021-01-01 DIAGNOSIS — Z8781 Personal history of (healed) traumatic fracture: Secondary | ICD-10-CM

## 2021-01-01 DIAGNOSIS — S93325S Dislocation of tarsometatarsal joint of left foot, sequela: Secondary | ICD-10-CM

## 2021-01-01 DIAGNOSIS — R2689 Other abnormalities of gait and mobility: Secondary | ICD-10-CM

## 2021-01-01 DIAGNOSIS — R262 Difficulty in walking, not elsewhere classified: Secondary | ICD-10-CM

## 2021-01-01 DIAGNOSIS — M25572 Pain in left ankle and joints of left foot: Secondary | ICD-10-CM

## 2021-01-01 DIAGNOSIS — M6281 Muscle weakness (generalized): Secondary | ICD-10-CM

## 2021-01-01 NOTE — Therapy (Signed)
[place], [place], [address] Phone: [phone]   Fax:  [phone]  Physical Therapy Treatment/ERO/DIscharge  Patient Details  Name: Valerie Friedman MRN: [id number] Date of Birth: [birth year] Referring Provider (PT): [name], MD   Encounter Date: 01/01/2021   PT End of Session - 01/01/21 1547     Visit Number 13    Number of Visits 17    Date for PT Re-Evaluation 12/29/20    Progress Note Due on Visit 20    PT Start Time 1545    PT Stop Time 1630    PT Time Calculation (min) 45 min    Activity Tolerance Patient tolerated treatment well    Behavior During Therapy [facility] for tasks assessed/performed             Past Medical History:  Diagnosis Date   Anemia    Anxiety    Arthritis    Cataracts, bilateral    MD just watching   Chronic lower back pain DUE TO MYOTONIC DYSTROPHY   Complication of anesthesia    slow to awaken , respirations either slow or fast.   Depression    patient denies this dx,  Therapist said that patient that she was fine after 2 years- ~2000   Endometriosis    Family history of adverse reaction to anesthesia 2022   brother was discharged after surgery had to be  taken back to hospital  CO2 was elevated, he was in the hospital for 9 days.   GERD (gastroesophageal reflux disease)    diet controlled   History of right foot drop    Migraine    Myotonic dystrophy ([place]) DX JULY 2009  ----- TYPE 1   FOLLOWED BY DR. LOVE   Numbness of fingers BILATERAL HANDS/  OCCASIONLLY RADIATES UP ARMS   Pelvic pain in female    Pneumonia    Seasonal allergies    Sleep apnea    does not use cpap   Vertigo    no current problems in the last year   Vitamin D deficiency     Past Surgical History:  Procedure Laterality Date   BENIGN BREAST BX   FEB  2011   BREAST BIOPSY Right 2011   Benign Stereo    LAPAROSCOPY  05/01/2011   Procedure: LAPAROSCOPY DIAGNOSTIC;  Surgeon:  Bennetta Laos, MD;  Location: [facility];  Service: Gynecology;  Laterality: N/A;  with bilateral chromopertubation, aspiration of functional left ovarian cyst, excision of two hydatid cysts   MUSCLE BIOPSY  02-25-2007   LEFT THIGH QUADRICEP BX X3  FOR MYOSITIS   OPEN REDUCTION INTERNAL FIXATION (ORIF) FOOT LISFRANC FRACTURE Left 09/05/2020   Procedure: OPEN REDUCTION INTERNAL FIXATION (ORIF) LEFT FOOT LISFRANC FRACTURE DISLOCATION;  Surgeon: [name], MD;  Location: [place];  Service: Orthopedics;  Laterality: Left;   SURG. FOR REMOVAL OF TEETH FRAGMENTS    MARCH 2012   ORAL SURGEON OFFICE   WISDOM TOOTH EXTRACTION      There were no vitals filed for this visit.   Subjective Assessment - 01/01/21 1552     Subjective Pt reports having the flu last week, but is now feeling better. Pt reports she is not having l foot pain    Pertinent History ORIF s/p Lisfranc dislocation; Myotonic dystrophy; L GT injury s/p fall with falling OOB 10/9; anxiety    Diagnostic tests X-ray 10/23/20:Graphs of the left great toe are negative.  No  sign of fracture. X-ray 10/16/20:Three-view radiographs of the left foot shows stable internal fixation   without lucency without loss of reduction.    Patient Stated Goals "To walk without pain"    Currently in Pain? No/denies    Pain Score 0-No pain    Pain Location Foot    Pain Orientation Left                    [facility] Adult PT Treatment/Exercise:   Therapeutic Exercise:  - PF stretch c strap 1x 30 sec L and R - Calf stretch on wedge, Both, for gastroc and soleus, 30 sec, 2x each - 4 way ankle GreenTband. X10, Medial deviation c PF - STS, 2x10 - LAQ, 2x10, 3#, L and R - SLR, 2x10, 3#, L and R - Bridging 2x10 - hip abd calms, blackTB, 2x10 - SLS at counter, 1x30" ea LE - Narrow BOS, ball toss, trap2x10                            PT Education - 01/01/21 1704     Education Details Finalized HEP     Person(s) Educated Patient    Methods Explanation;Demonstration;Tactile cues;Verbal cues;Handout    Comprehension Verbalized understanding;Returned demonstration;Verbal cues required;Tactile cues required              PT Short Term Goals - 12/13/20 1740       PT SHORT TERM GOAL #1   Title Pt will be Ind with and initial HEP    Status Achieved    Target Date 11/13/20      PT SHORT TERM GOAL #2   Title Pt will voice understanding of measures to decrease L foot pain. 11/13/20: Pt is able to recall measures of elevation, meds, cold packs, adn wrapping with an ace bandage to manage pain    Status Achieved    Target Date 11/13/20               PT Long Term Goals - 01/01/21 1705       PT LONG TERM GOAL #1   Title Pt will be Ind in a final HEP to maintain achieved LOF    Status Achieved    Target Date 01/01/21      PT LONG TERM GOAL #2   Title Increase biat ankle DF ROM to 0d (neutral) forimproved gait quality and to decrease strain of the L mid foot    Baseline see flowsheet    Status Achieved    Target Date 12/18/20      PT LONG TERM GOAL #3   Title Increase L toe flexion ROM to 75% of the R for improved L foot function with pushing off and balance    Status Achieved    Target Date 12/18/20      PT LONG TERM GOAL #4   Title Increase L foot DF and Evervion strength to 5/5 for improved L foot function and balance with gait    Status Partially Met    Target Date 12/18/20      PT LONG TERM GOAL #5   Title Pt will report a decrease in L foot pain to 4/10 or less for improved functional mobility and QOL    Baseline reports average pain of 1-2/10 over the past few days    Status Achieved    Target Date 12/18/20      PT LONG TERM GOAL #6   Title Patient  will ambulate at least 400 ft durign 2MWT test to signify improvements in gait speed. 01/01/21- 452f    Baseline see flowsheet    Status Achieved    Target Date 01/01/21                   Plan - 01/01/21  1547     Clinical Impression Statement Pt completed her PT program today making good progress re: pain, ROM, strength and function of the L foot/ankle. Pt is Ind in a final HEP to maintain achieved LOF. The final HEP was reviewed and completed today. Remaining weakness for the LEs is bilateral and related to her myotonic dystrophy.    Personal Factors and Comorbidities Comorbidity 3+    Comorbidities ORIF s/p Lisfranc dislocation; Myotonic dystrophy; L GT injury s/p fall with falling OOB 10/9; anxiety    Examination-Activity Limitations Locomotion Level;Transfers;Stand    Stability/Clinical Decision Making Evolving/Moderate complexity    Clinical Decision Making Moderate    Rehab Potential Good    PT Frequency 2x / week    PT Duration 8 weeks    PT Treatment/Interventions ADLs/Self Care Home Management;Iontophoresis 429mml Dexamethasone;Moist Heat;Ultrasound;Balance training;Therapeutic exercise;Therapeutic activities;Functional mobility training;Stair training;Gait training;Patient/family education;Manual techniques;Dry needling;Taping;Vasopneumatic Device    PT Next Visit Plan --    PT Home Exercise Plan [email or web address]    Consulted and Agree with Plan of Care Patient             Patient will benefit from skilled therapeutic intervention in order to improve the following deficits and impairments:  Abnormal gait, Difficulty walking, Decreased range of motion, Pain, Decreased activity tolerance, Decreased mobility, Decreased strength  Visit Diagnosis: Pain in left ankle and joints of left foot  Decreased ROM of left foot  Muscle weakness (generalized)  Difficulty in walking, not elsewhere classified  Other abnormalities of gait and mobility  Lisfranc's dislocation, left, sequela  S/P ORIF (open reduction internal fixation) fracture     Problem List Patient Active Problem List   Diagnosis Date Noted   Lisfranc dislocation, left, sequela    Vertigo 10/28/2017   GERD  (gastroesophageal reflux disease) 06/15/2017   Circadian rhythm sleep disorder 04/30/2017   OSA (obstructive sleep apnea) 04/30/2017   Vitamin D deficiency 04/16/2016   Foot drop, right 03/26/2016   Anxiety    Myotonic dystrophy ([place]   Fibromyalgia    Migraine    WEIGHT LOSS 07/11/2008   HEADACHE 07/11/2008   NICOTINE ADDICTION 10/20/2007   MYOTONIC MUSCULAR DYSTROPHY 09/24/2007   MYALGIA/MYOSITIS NOS 08/03/2006   Depression 06/23/2006   PHYSICAL THERAPY DISCHARGE SUMMARY  Visits from Start of Care: 13  Current functional level related to goals / functional outcomes: See above   Remaining deficits: See above   Education / Equipment: HEP   Patient agrees to discharge. Patient goals were  majority of PT goals met . Patient is being discharged due to being pleased with the current functional level.   AlGar PontoS, PT 01/01/21 5:18 PM   [facility]hone: [phone] Fax:  [phone]: InLIYANA SUNIGARN: [id number] of Birth: 01/19/07/1977

## 2021-01-03 ENCOUNTER — Ambulatory Visit: Payer: 59

## 2021-01-28 ENCOUNTER — Encounter: Payer: Self-pay | Admitting: Family Medicine

## 2021-01-28 ENCOUNTER — Ambulatory Visit (INDEPENDENT_AMBULATORY_CARE_PROVIDER_SITE_OTHER): Payer: 59 | Admitting: Family Medicine

## 2021-01-28 VITALS — BP 128/86 | HR 89 | Temp 98.5°F | Ht 62.5 in | Wt 140.8 lb

## 2021-01-28 DIAGNOSIS — E785 Hyperlipidemia, unspecified: Secondary | ICD-10-CM

## 2021-01-28 DIAGNOSIS — D229 Melanocytic nevi, unspecified: Secondary | ICD-10-CM | POA: Diagnosis not present

## 2021-01-28 DIAGNOSIS — R739 Hyperglycemia, unspecified: Secondary | ICD-10-CM | POA: Diagnosis not present

## 2021-01-28 DIAGNOSIS — J019 Acute sinusitis, unspecified: Secondary | ICD-10-CM | POA: Diagnosis not present

## 2021-01-28 MED ORDER — AMOXICILLIN-POT CLAVULANATE 875-125 MG PO TABS: 1.0000 | ORAL_TABLET | Freq: Two times a day (BID) | ORAL | 0 refills | Status: DC

## 2021-01-28 NOTE — Progress Notes (Signed)
° °  Subjective:    Patient ID: Valerie Friedman, female    DOB: 11/06/1975, 46 y.o.   MRN: [id number]  HPI Here for several issues. First about 5 days ago she developed a ST, low grade fevers, stuffy sinuses, PND, and a dry cough. Now the cough produces yellow sputum. No SOB. Also she asks me to check a mole on her face. This has been present for years and it never changes in appearance with the exception of growing hairs out of it for the past 2 years. It has not changed in size or color.    Review of Systems  Constitutional:  Positive for fever. Negative for diaphoresis.  HENT:  Positive for congestion, facial swelling, postnasal drip, sinus pressure and sore throat.   Eyes: Negative.   Respiratory:  Positive for cough. Negative for chest tightness, shortness of breath and wheezing.       Objective:   Physical Exam Constitutional:      Appearance: Normal appearance. She is not ill-appearing.     Comments: Her voice is hoarse   HENT:     Right Ear: Tympanic membrane, ear canal and external ear normal.     Left Ear: Tympanic membrane, ear canal and external ear normal.     Nose: Nose normal.     Mouth/Throat:     Comments: Posterior OP is red without exudate  Eyes:     Conjunctiva/sclera: Conjunctivae normal.  Pulmonary:     Effort: Pulmonary effort is normal.     Breath sounds: Normal breath sounds.  Lymphadenopathy:     Cervical: No cervical adenopathy.  Skin:    Comments: There is a 3 mm non-pigmented papular lesion on the left cheek. This has 3-4 small hairs growing out of it. The borders are well defined.   Neurological:     Mental Status: She is alert.          Assessment & Plan:  First she has a sinusitis and we will treat this with Augmentin for 10 days. Add Mucinex as needed. Also she has a hairy nevus on the cheek. This appears to be benign. She wants to avoid any surgery if possible, so we agreed to watch this closely. We spent a total of ( 31  ) minutes  reviewing records and discussing these issues.  Alysia Penna, MD

## 2021-01-29 LAB — BASIC METABOLIC PANEL
BUN: 8 mg/dL (ref 6–23)
CO2: 26 mEq/L (ref 19–32)
Calcium: 9.5 mg/dL (ref 8.4–10.5)
Chloride: 104 mEq/L (ref 96–112)
Creatinine, Ser: 0.62 mg/dL (ref 0.40–1.20)
GFR: 107.09 mL/min (ref >60.00)
Glucose, Bld: 197 mg/dL — ABNORMAL HIGH (ref 70–99)
Potassium: 4.3 mEq/L (ref 3.5–5.1)
Sodium: 140 mEq/L (ref 135–145)

## 2021-01-29 LAB — CBC WITH DIFFERENTIAL/PLATELET
Basophils Absolute: 0.1 10*3/uL (ref 0.0–0.1)
Basophils Relative: 0.9 % (ref 0.0–3.0)
Eosinophils Absolute: 0.1 10*3/uL (ref 0.0–0.7)
Eosinophils Relative: 1.1 % (ref 0.0–5.0)
HCT: 45.7 % (ref 36.0–46.0)
Hemoglobin: 14.8 g/dL (ref 12.0–15.0)
Lymphocytes Relative: 20 % (ref 12.0–46.0)
Lymphs Abs: 1.7 10*3/uL (ref 0.7–4.0)
MCHC: 32.5 g/dL (ref 30.0–36.0)
MCV: 93.1 fl (ref 78.0–100.0)
Monocytes Absolute: 0.4 10*3/uL (ref 0.1–1.0)
Monocytes Relative: 4.6 % (ref 3.0–12.0)
Neutro Abs: 6.2 10*3/uL (ref 1.4–7.7)
Neutrophils Relative %: 73.4 % (ref 43.0–77.0)
Platelets: 129 10*3/uL — ABNORMAL LOW (ref 150.0–400.0)
RBC: 4.91 Mil/uL (ref 3.87–5.11)
RDW: 14.4 % (ref 11.5–15.5)
WBC: 8.4 10*3/uL (ref 4.0–10.5)

## 2021-01-29 LAB — LIPID PANEL
Cholesterol: 156 mg/dL (ref 0–200)
HDL: 35.7 mg/dL — ABNORMAL LOW (ref >39.00)
LDL Cholesterol: 80 mg/dL (ref 0–99)
NonHDL: 120.02
Total CHOL/HDL Ratio: 4
Triglycerides: 198 mg/dL — ABNORMAL HIGH (ref 0.0–149.0)
VLDL: 39.6 mg/dL (ref 0.0–40.0)

## 2021-01-29 LAB — HEPATIC FUNCTION PANEL
ALT: 28 U/L (ref 0–35)
AST: 30 U/L (ref 0–37)
Albumin: 4.2 g/dL (ref 3.5–5.2)
Alkaline Phosphatase: 76 U/L (ref 39–117)
Bilirubin, Direct: 0.1 mg/dL (ref 0.0–0.3)
Total Bilirubin: 0.9 mg/dL (ref 0.2–1.2)
Total Protein: 7.4 g/dL (ref 6.0–8.3)

## 2021-01-29 LAB — HEMOGLOBIN A1C: Hgb A1c MFr Bld: 9.4 % — ABNORMAL HIGH (ref 4.6–6.5)

## 2021-01-29 LAB — TSH: TSH: 1.38 u[IU]/mL (ref 0.35–5.50)

## 2021-02-01 ENCOUNTER — Other Ambulatory Visit: Payer: Self-pay

## 2021-02-01 MED ORDER — METFORMIN HCL 500 MG PO TABS
500.0000 mg | ORAL_TABLET | Freq: Two times a day (BID) | ORAL | 2 refills | Status: DC
Start: 2021-02-01 — End: 2021-02-19

## 2021-02-19 ENCOUNTER — Ambulatory Visit (INDEPENDENT_AMBULATORY_CARE_PROVIDER_SITE_OTHER): Payer: 59 | Admitting: Family Medicine

## 2021-02-19 ENCOUNTER — Encounter: Payer: Self-pay | Admitting: Family Medicine

## 2021-02-19 VITALS — BP 124/80 | HR 80 | Temp 97.6°F | Ht 62.5 in | Wt 142.0 lb

## 2021-02-19 DIAGNOSIS — E1165 Type 2 diabetes mellitus with hyperglycemia: Secondary | ICD-10-CM | POA: Diagnosis not present

## 2021-02-19 DIAGNOSIS — Z0000 Encounter for general adult medical examination without abnormal findings: Secondary | ICD-10-CM | POA: Diagnosis not present

## 2021-02-19 NOTE — Progress Notes (Signed)
Subjective:    Patient ID: Valerie Friedman, female    DOB: 1975-10-19, 46 y.o.   MRN: [id number]  HPI Here for a well exam. She feels fine. She sees her GYN yearly. She had labs drawn here on 01-28-21 which showed her A1c has risen to 9.4%. Otherwise her TG were mildly high at 198, and the LDL was excellent at 80. We had intended for her to start on Metformin, but she has not picked this up yet. She asks if she can work hard on diet and exercise for 90 days, and then recheck the A1c. She has a strong family hx of type 2 diabetes.    Review of Systems  Constitutional: Negative.   HENT: Negative.    Eyes: Negative.   Respiratory: Negative.    Cardiovascular: Negative.   Gastrointestinal: Negative.   Genitourinary:  Negative for decreased urine volume, difficulty urinating, dyspareunia, dysuria, enuresis, flank pain, frequency, hematuria, pelvic pain and urgency.  Musculoskeletal: Negative.   Skin: Negative.   Neurological: Negative.  Negative for headaches.  Psychiatric/Behavioral: Negative.        Objective:   Physical Exam Constitutional:      General: She is not in acute distress.    Appearance: Normal appearance. She is well-developed.  HENT:     Head: Normocephalic and atraumatic.     Right Ear: External ear normal.     Left Ear: External ear normal.     Nose: Nose normal.     Mouth/Throat:     Pharynx: No oropharyngeal exudate.  Eyes:     General: No scleral icterus.    Conjunctiva/sclera: Conjunctivae normal.     Pupils: Pupils are equal, round, and reactive to light.  Neck:     Thyroid: No thyromegaly.     Vascular: No JVD.  Cardiovascular:     Rate and Rhythm: Normal rate and regular rhythm.     Heart sounds: Normal heart sounds. No murmur heard.   No friction rub. No gallop.  Pulmonary:     Effort: Pulmonary effort is normal. No respiratory distress.     Breath sounds: Normal breath sounds. No wheezing or rales.  Chest:     Chest wall: No tenderness.   Abdominal:     General: Bowel sounds are normal. There is no distension.     Palpations: Abdomen is soft. There is no mass.     Tenderness: There is no abdominal tenderness. There is no guarding or rebound.  Musculoskeletal:        General: No tenderness. Normal range of motion.     Cervical back: Normal range of motion and neck supple.  Lymphadenopathy:     Cervical: No cervical adenopathy.  Skin:    General: Skin is warm and dry.     Findings: No erythema or rash.  Neurological:     Mental Status: She is alert and oriented to person, place, and time.     Cranial Nerves: No cranial nerve deficit.     Motor: No abnormal muscle tone.     Coordination: Coordination normal.     Deep Tendon Reflexes: Reflexes are normal and symmetric. Reflexes normal.  Psychiatric:        Behavior: Behavior normal.        Thought Content: Thought content normal.        Judgment: Judgment normal.          Assessment & Plan:  Well exam. We discussed diet and exercise. She has recently been  diagnosed with type 2 diabetes, and we discussed about how this starts off with insulin resistance in the body. I explained that Metformin is designed to lower this resistance. We agreed that she will try diet and exercise alone first. We will refer her to Nutrition for education. We will recheck another A1c in 90 days.  Alysia Penna, MD

## 2021-04-10 ENCOUNTER — Ambulatory Visit: Payer: 59 | Admitting: Dietician

## 2021-05-13 ENCOUNTER — Telehealth: Payer: Self-pay

## 2021-05-13 ENCOUNTER — Telehealth (INDEPENDENT_AMBULATORY_CARE_PROVIDER_SITE_OTHER): Payer: 59 | Admitting: Family Medicine

## 2021-05-13 ENCOUNTER — Encounter: Payer: Self-pay | Admitting: Family Medicine

## 2021-05-13 DIAGNOSIS — E1165 Type 2 diabetes mellitus with hyperglycemia: Secondary | ICD-10-CM | POA: Diagnosis not present

## 2021-05-13 MED ORDER — TIRZEPATIDE 2.5 MG/0.5ML ~~LOC~~ SOAJ: 2.5000 mg | SUBCUTANEOUS | 0 refills | Status: DC

## 2021-05-13 NOTE — Progress Notes (Signed)
? ?Subjective:  ? ? Patient ID: Valerie Friedman, female    DOB: 12/10/1975, 46 y.o.   MRN: [id number] ? ?HPI ?Virtual Visit via Video Note ? ?I connected with the patient on 05/13/21 at  3:00 PM EDT by a video enabled telemedicine application and verified that I am speaking with the correct person using two identifiers. ? Location patient: home ?Location provider:work or home office ?Persons participating in the virtual visit: patient, provider ? ?I discussed the limitations of evaluation and management by telemedicine and the availability of in person appointments. The patient expressed understanding and agreed to proceed. ? ? ?HPI: ?Here asking about her type 2 diabetes. Her A1c on 01-28-21 was 9.4, and that day we talked about starting on medications. She declined but she sais she would start on a diet and exercise plan. She has done this for 3 months, but she has not lost any weight at all. She is now asking about trying Mounjaro to both get her diabetes under control and to lose weight. She feels fine.  ? ? ?ROS: See pertinent positives and negatives per HPI. ? ?Past Medical History:  ?Diagnosis Date  ? Anemia   ? Anxiety   ? Arthritis   ? Cataracts, bilateral   ? MD just watching  ? Chronic lower back pain DUE TO MYOTONIC DYSTROPHY  ? Complication of anesthesia   ? slow to awaken , respirations either slow or fast.  ? Depression   ? patient denies this dx,  Therapist said that patient that she was fine after 2 years- ~2000  ? Endometriosis   ? Family history of adverse reaction to anesthesia 2022  ? brother was discharged after surgery had to be  taken back to hospital  CO2 was elevated, he was in the hospital for 9 days.  ? GERD (gastroesophageal reflux disease)   ? diet controlled  ? History of right foot drop   ? Migraine   ? Myotonic dystrophy ([place]) [place] 2009  ----- TYPE 1  ? FOLLOWED BY DR. Erling Cruz  ? Numbness of fingers BILATERAL HANDS/  OCCASIONLLY RADIATES UP ARMS  ? Pelvic pain in female   ? Pneumonia    ? Seasonal allergies   ? Sleep apnea   ? does not use cpap  ? Vertigo   ? no current problems in the last year  ? Vitamin D deficiency   ? ? ?Past Surgical History:  ?Procedure Laterality Date  ? BENIGN BREAST BX   FEB  2011  ? BREAST BIOPSY Right 2011  ? Benign Stereo   ? LAPAROSCOPY  05/01/2011  ? Procedure: LAPAROSCOPY DIAGNOSTIC;  Surgeon: Bennetta Laos, MD;  Location: [facility];  Service: Gynecology;  Laterality: N/A;  with bilateral chromopertubation, aspiration of functional left ovarian cyst, excision of two hydatid cysts  ? MUSCLE BIOPSY  02-25-2007  ? LEFT THIGH QUADRICEP BX X3  FOR MYOSITIS  ? OPEN REDUCTION INTERNAL FIXATION (ORIF) FOOT LISFRANC FRACTURE Left 09/05/2020  ? Procedure: OPEN REDUCTION INTERNAL FIXATION (ORIF) LEFT FOOT LISFRANC FRACTURE DISLOCATION;  Surgeon: [name], MD;  Location: [facility];  Service: Orthopedics;  Laterality: Left;  ? SURG. FOR REMOVAL OF TEETH FRAGMENTS    MARCH 2012  ? ORAL SURGEON OFFICE  ? WISDOM TOOTH EXTRACTION    ? ? ?Family History  ?Problem Relation Age of Onset  ? Diabetes Mother   ? Hypertension Father   ? Hyperlipidemia Father   ? Heart disease Father   ?  Hypertension Brother   ? Diabetes Brother   ? Atrial fibrillation Brother   ? Heart disease Maternal Grandmother   ? Diabetes Maternal Grandfather   ? Heart disease Maternal Grandfather   ? Coronary artery disease Maternal Grandfather   ? Heart disease Paternal Grandmother   ? Stroke Paternal Grandmother   ? CAD Paternal Grandmother   ? Heart disease Paternal Grandfather   ? Cancer Paternal Uncle   ?     Colon cancer  ? Parkinsonism Paternal Uncle   ? Coronary artery disease Maternal Uncle   ? ? ? ?Current Outpatient Medications:  ?  acetaminophen (TYLENOL) 500 MG tablet, Take 500 mg by mouth every 6 (six) hours as needed for mild pain., Disp: , Rfl:  ?  amoxicillin (AMOXIL) 875 MG tablet, Take 875 mg by mouth 2 (two) times daily. For 10 days, Disp: , Rfl:  ?  cetirizine (ZYRTEC) 10  MG tablet, Take 10 mg by mouth daily as needed for allergies., Disp: , Rfl:  ?  Multiple Vitamin (MULTIVITAMIN WITH MINERALS) TABS tablet, Take 1 tablet by mouth in the morning. Women's One A Day, Disp: , Rfl:  ?  tirzepatide (MOUNJARO) 2.5 MG/0.5ML Pen, Inject 2.5 mg into the skin once a week., Disp: 2 mL, Rfl: 0 ? ?EXAM: ? ?VITALS per patient if applicable: ? ?GENERAL: alert, oriented, appears well and in no acute distress ? ?HEENT: atraumatic, conjunttiva clear, no obvious abnormalities on inspection of external nose and ears ? ?NECK: normal movements of the head and neck ? ?LUNGS: on inspection no signs of respiratory distress, breathing rate appears normal, no obvious gross SOB, gasping or wheezing ? ?CV: no obvious cyanosis ? ?MS: moves all visible extremities without noticeable abnormality ? ?PSYCH/NEURO: pleasant and cooperative, no obvious depression or anxiety, speech and thought processing grossly intact ? ?ASSESSMENT AND PLAN: ?Type 2 diabetes, poorly controlled. We will start heron Mounjaro 2.5 mg weekly for 4 weeks. We will follow up with her at that point. We will also provide her with a glucometer and testing supplies so she can track her glucoses at home.  ?Alysia Penna, MD ? ?Discussed the following assessment and plan: ? ?No diagnosis found. ? ? ?  ?I discussed the assessment and treatment plan with the patient. The patient was provided an opportunity to ask questions and all were answered. The patient agreed with the plan and demonstrated an understanding of the instructions. ?  ?The patient was advised to call back or seek an in-person evaluation if the symptoms worsen or if the condition fails to improve as anticipated. ? ?  ? ? ?Review of Systems ? ?   ?Objective:  ? Physical Exam ? ? ? ? ?   ?Assessment & Plan:  ? ? ?

## 2021-05-13 NOTE — Telephone Encounter (Signed)
Pt Rx for glucose meter was placed at the office cabinet for pt to pick up per Dr Sarajane Jews ?

## 2021-05-13 NOTE — Addendum Note (Signed)
Addended by: Alysia Penna A on: 05/13/2021 04:01 PM ? ? Modules accepted: Orders ? ?

## 2021-05-22 ENCOUNTER — Other Ambulatory Visit: Payer: 59

## 2021-08-04 IMAGING — CT CT HEART MORP W/ CTA COR W/ SCORE W/ CA W/CM &/OR W/O CM
2 of 6 series · 12 of 20 positions shown, 14 images · IV contrast (APPLIED)
Comparison: None
COMPARISON: None

Addendum:
EXAM:
OVER-READ INTERPRETATION  CT CHEST

The following report is an over-read performed by radiologist Dr.
over-read does not include interpretation of cardiac or coronary
anatomy or pathology. The coronary calcium score/coronary CTA
interpretation by the cardiologist is attached.
HISTORY: 45 yo female, CAD screening, 10yr CHD risk > 20%
Cardiac/Coronary CTA
TECHNIQUE: The patient was scanned on a Siemens Force scanner.
PROTOCOL: A 80 kV prospective scan was triggered in the descending thoracic
aorta at 111 HU's. Axial non-contrast 3 mm slices were carried out
through the heart. The data set was analyzed on a dedicated work
station and scored using the Agatson method. Gantry rotation speed
was 250 msecs and collimation was .6 mm. Beta blockade and 0.8 mg of
sl NTG was given. The 3D data set was reconstructed at 65 and 75% of
the R-R cycle. Diastolic phases were analyzed on a dedicated work
station using MPR, MIP and VRT modes. The patient received 95mL
OMNIPAQUE IOHEXOL 350 MG/ML SOLN of contrast.

[Series 6: fl_corcta 0.6 bv36 3 65% · axial · 0.36mm/px · z∈[-190,-105]mm · 6 of 299 slices shown, 8 images]
[im 43/299  vessel]
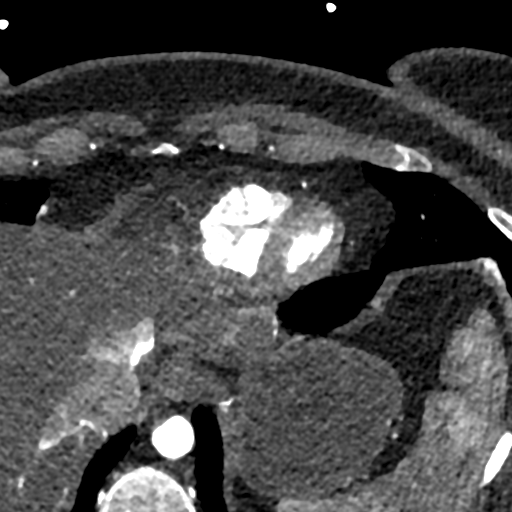
[im 43/299  lung]
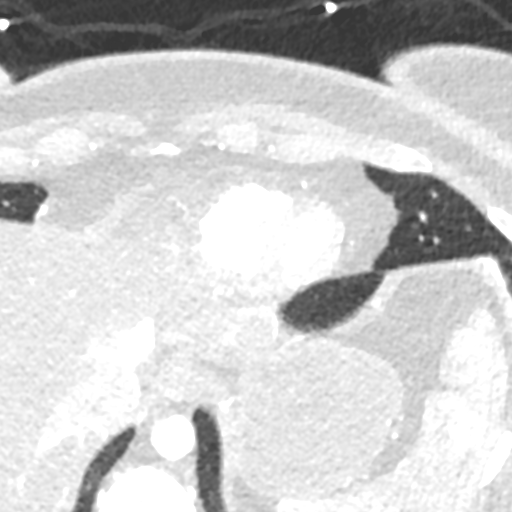
[im 86/299  vessel]
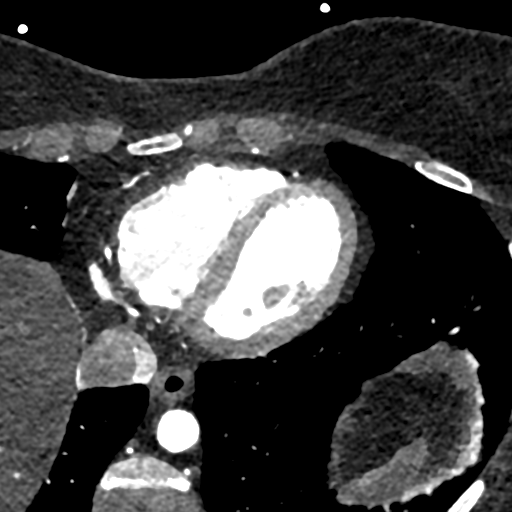
[im 128/299  vessel]
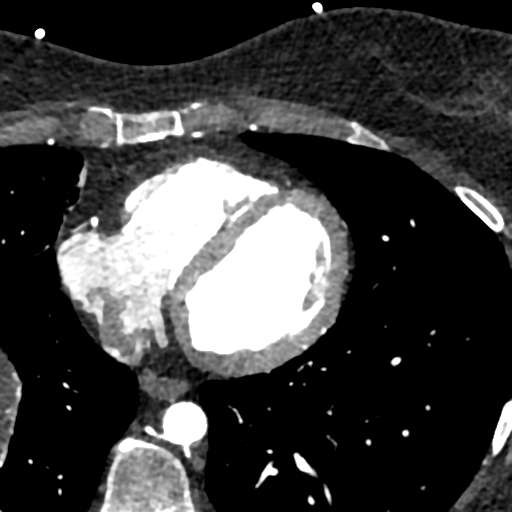
[im 171/299  vessel]
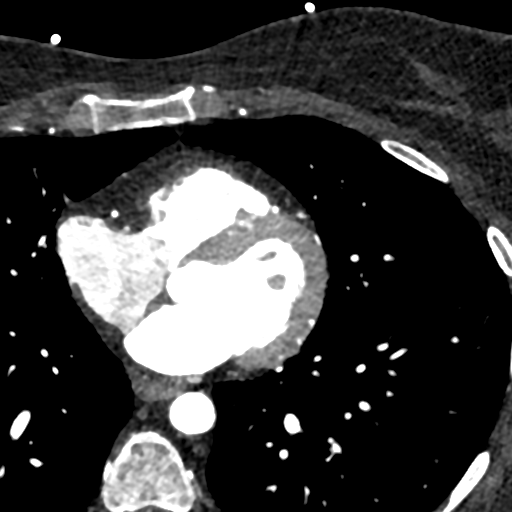
[im 213/299  vessel]
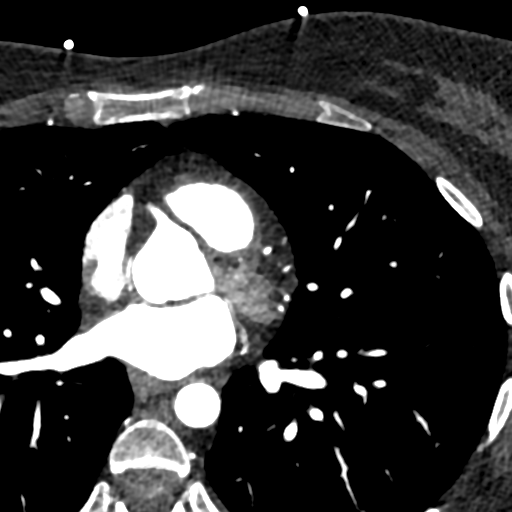
[im 213/299  lung]
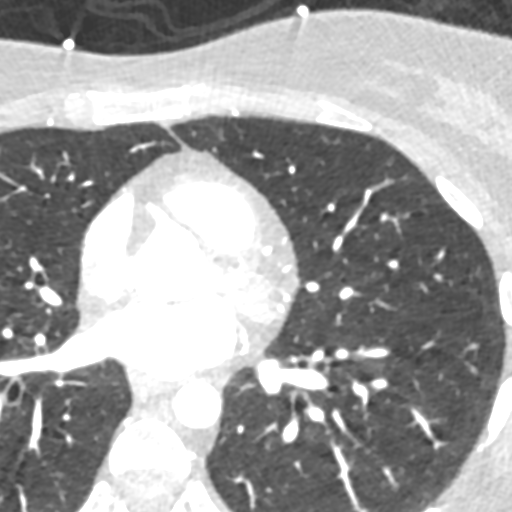
[im 256/299  vessel]
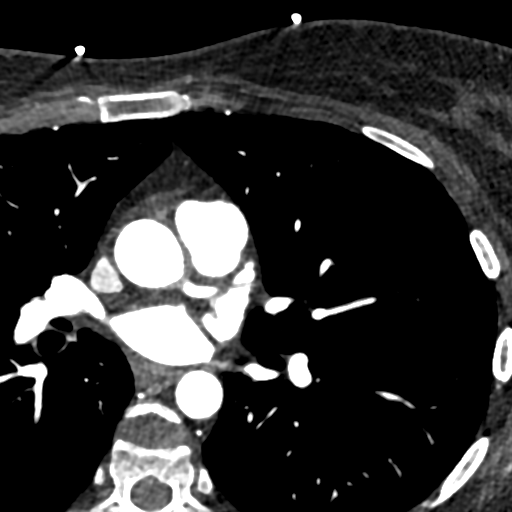

[Series 7: fl_corcta 0.6 bv44 3 65% · axial · 0.36mm/px · z∈[-190,-105]mm · 6 of 299 slices shown]
[im 43/299  vessel]
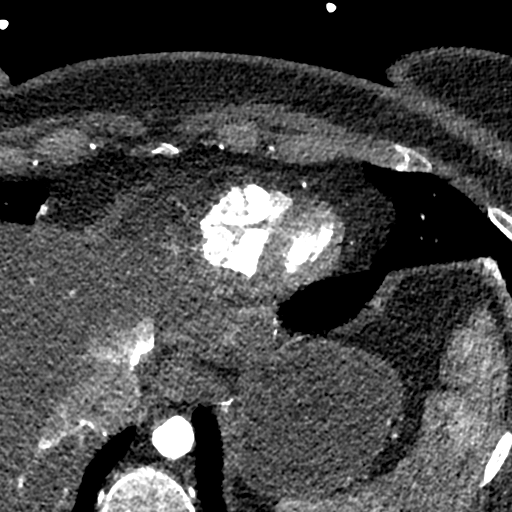
[im 86/299  vessel]
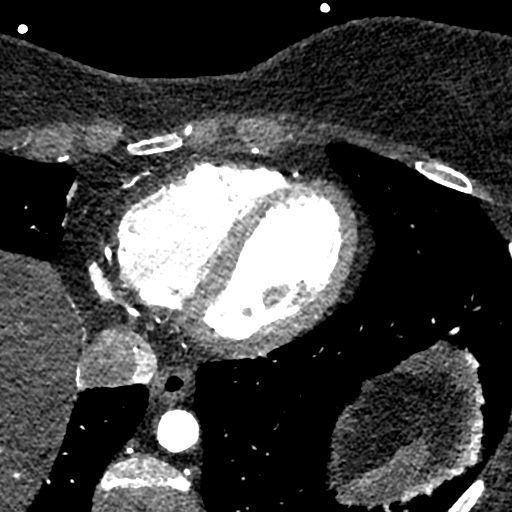
[im 128/299  vessel]
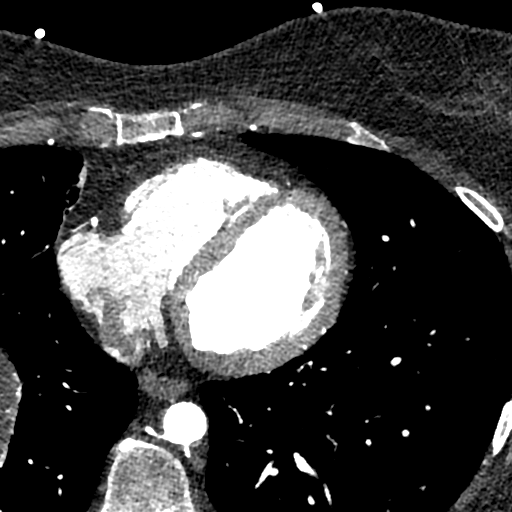
[im 171/299  vessel]
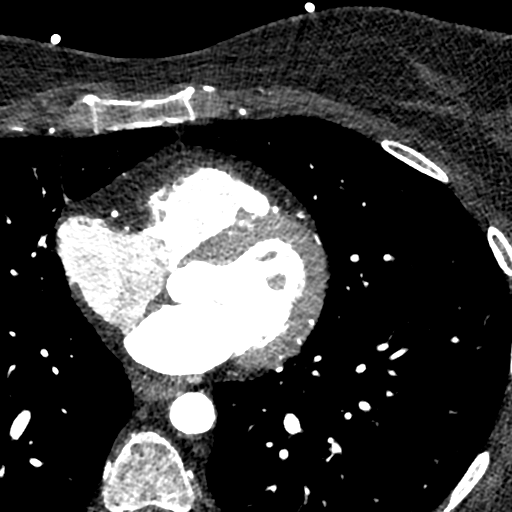
[im 213/299  vessel]
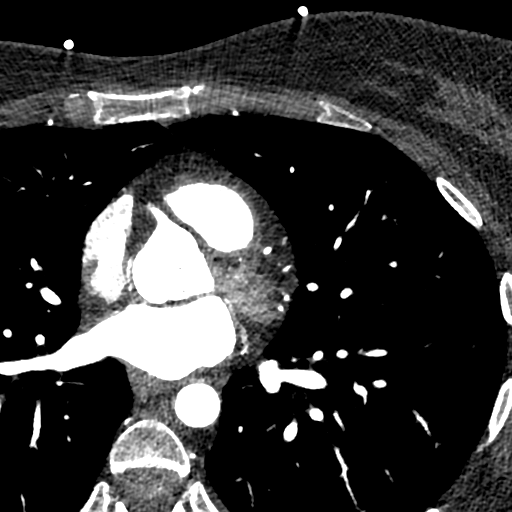
[im 256/299  vessel]
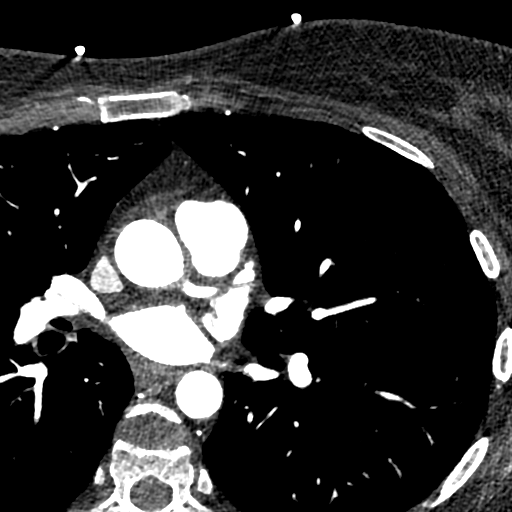

[12 of 20 positions shown; findings below may reference images not displayed]

FINDINGS: Vascular: Please see dedicated cardiology report for details
regarding cardiac structures. Aorta with grossly normal caliber. No
pericardial effusion.

Mediastinum/Nodes: Mildly patulous esophagus with mild
circumferential thickening. No signs of adenopathy in the visualized
chest. Chest imaged from mid chest through the lung bases to
encompass the heart.

Lungs/Pleura: Minimal atelectasis at the lung bases. Mild scarring
in the RIGHT middle lobe. No effusion. No consolidation in the
visualized chest. Visualized airways are patent.

Upper Abdomen: Signs of at least moderate moderate hepatic steatosis
in the upper abdomen. Reflux of contrast into hepatic veins

Musculoskeletal: No acute musculoskeletal finding. No acute
musculoskeletal process.
IMPRESSION: 1. Signs of at least moderate hepatic steatosis.
2. Correlate with recent echocardiography. May be due to injection
rate and Valsalva, can be seen setting of cardiac dysfunction.
3. Mildly patulous esophagus with mild circumferential thickening,
correlate with any clinical evidence of esophagitis.
FINDINGS: Quality: Fair, significant contrast noted in the right heart

Coronary calcium score: The patient's coronary artery calcium score
is 0, which places the patient in the 0 percentile.

Coronary arteries: Normal coronary origins.  Right dominance.

Right Coronary Artery: Dominant.  Normal vessel.

Left Main Coronary Artery: Normal. Bifurcates into the LAD and LCx
arteries.

Left Anterior Descending Coronary Artery: Normal anterior vessel. 2
diagonal branches without disease.

Left Circumflex Artery: Smaller caliber tortuous vessel - single OM1
branch. No disease.

Aorta: Normal size, 22 mm at the mid ascending aorta (level of the
PA bifurcation) measured double oblique. No calcifications. No
dissection.

Aortic Valve: Trileaflet.  No calcifications.

Other findings:

Normal pulmonary vein drainage into the left atrium.

Normal left atrial appendage without a thrombus.

Normal size of the pulmonary artery.

Suspect small membranous VSD with left to right flow (see image
captures)
IMPRESSION: 1. No evidence of CAD, CADRADS = 0.

2. Coronary calcium score of 0. This was 0 percentile for age and
sex matched control.

3. Normal coronary origin with right dominance.

4. Suspect small membranous VSD with left to right flow. This could
explain the high contrast signal in the RV. Recent 2D echo
personally reviewed does not show an obvious VSD. Recommend MAYERS to
further evaluate if clinical suspicion is high.

*** End of Addendum ***
EXAM:
OVER-READ INTERPRETATION  CT CHEST

The following report is an over-read performed by radiologist Dr.
over-read does not include interpretation of cardiac or coronary
anatomy or pathology. The coronary calcium score/coronary CTA
interpretation by the cardiologist is attached.
FINDINGS: Vascular: Please see dedicated cardiology report for details
regarding cardiac structures. Aorta with grossly normal caliber. No
pericardial effusion.

Mediastinum/Nodes: Mildly patulous esophagus with mild
circumferential thickening. No signs of adenopathy in the visualized
chest. Chest imaged from mid chest through the lung bases to
encompass the heart.

Lungs/Pleura: Minimal atelectasis at the lung bases. Mild scarring
in the RIGHT middle lobe. No effusion. No consolidation in the
visualized chest. Visualized airways are patent.

Upper Abdomen: Signs of at least moderate moderate hepatic steatosis
in the upper abdomen. Reflux of contrast into hepatic veins

Musculoskeletal: No acute musculoskeletal finding. No acute
musculoskeletal process.
IMPRESSION: 1. Signs of at least moderate hepatic steatosis.
2. Correlate with recent echocardiography. May be due to injection
rate and Valsalva, can be seen setting of cardiac dysfunction.
3. Mildly patulous esophagus with mild circumferential thickening,
correlate with any clinical evidence of esophagitis.

## 2021-08-12 ENCOUNTER — Telehealth (INDEPENDENT_AMBULATORY_CARE_PROVIDER_SITE_OTHER): Payer: 59 | Admitting: Family Medicine

## 2021-08-12 ENCOUNTER — Encounter: Payer: Self-pay | Admitting: Family Medicine

## 2021-08-12 DIAGNOSIS — E1165 Type 2 diabetes mellitus with hyperglycemia: Secondary | ICD-10-CM

## 2021-08-12 MED ORDER — GLUCOSE BLOOD VI STRP: ORAL_STRIP | 2 refills | Status: DC

## 2021-08-12 MED ORDER — GLIPIZIDE 5 MG PO TABS: 5.0000 mg | ORAL_TABLET | Freq: Two times a day (BID) | ORAL | 3 refills | Status: DC

## 2021-08-12 NOTE — Progress Notes (Signed)
Subjective:    Patient ID: Valerie Friedman, female    DOB: Mar 19, 1975, 46 y.o.   MRN: [id number]  HPI Virtual Visit via Video Note  I connected with the patient on 08/12/21 at  8:45 AM EDT by a video enabled telemedicine application and verified that I am speaking with the correct person using two identifiers.  Location patient: home Location provider:work or home office Persons participating in the virtual visit: patient, provider  I discussed the limitations of evaluation and management by telemedicine and the availability of in person appointments. The patient expressed understanding and agreed to proceed.   HPI: Here to ask advice bout her diabetes. She tried taking Mounjaro for 3 weeks, but she had to stop it due to nausea, vomiting, and diarrhea. Since then she has been watching a strict diet, but her am fasting glucoses remain high. Yesterday this was 204. She feels fine now.    ROS: See pertinent positives and negatives per HPI.  Past Medical History:  Diagnosis Date   Anemia    Anxiety    Arthritis    Cataracts, bilateral    MD just watching   Chronic lower back pain DUE TO MYOTONIC DYSTROPHY   Complication of anesthesia    slow to awaken , respirations either slow or fast.   Depression    patient denies this dx,  Therapist said that patient that she was fine after 2 years- ~2000   Endometriosis    Family history of adverse reaction to anesthesia 2022   brother was discharged after surgery had to be  taken back to hospital  CO2 was elevated, he was in the hospital for 9 days.   GERD (gastroesophageal reflux disease)    diet controlled   History of right foot drop    Migraine    Myotonic dystrophy ([place]) DX JULY 2009  ----- TYPE 1   FOLLOWED BY DR. LOVE   Numbness of fingers BILATERAL HANDS/  OCCASIONLLY RADIATES UP ARMS   Pelvic pain in female    Pneumonia    Seasonal allergies    Sleep apnea    does not use cpap   Vertigo    no current problems in the  last year   Vitamin D deficiency     Past Surgical History:  Procedure Laterality Date   BENIGN BREAST BX   FEB  2011   BREAST BIOPSY Right 2011   Benign Stereo    LAPAROSCOPY  05/01/2011   Procedure: LAPAROSCOPY DIAGNOSTIC;  Surgeon: Bennetta Laos, MD;  Location: [facility];  Service: Gynecology;  Laterality: N/A;  with bilateral chromopertubation, aspiration of functional left ovarian cyst, excision of two hydatid cysts   MUSCLE BIOPSY  02-25-2007   LEFT THIGH QUADRICEP BX X3  FOR MYOSITIS   OPEN REDUCTION INTERNAL FIXATION (ORIF) FOOT LISFRANC FRACTURE Left 09/05/2020   Procedure: OPEN REDUCTION INTERNAL FIXATION (ORIF) LEFT FOOT LISFRANC FRACTURE DISLOCATION;  Surgeon: [name], MD;  Location: [facility];  Service: Orthopedics;  Laterality: Left;   SURG. FOR REMOVAL OF TEETH FRAGMENTS    MARCH 2012   ORAL SURGEON OFFICE   WISDOM TOOTH EXTRACTION      Family History  Problem Relation Age of Onset   Diabetes Mother    Hypertension Father    Hyperlipidemia Father    Heart disease Father    Hypertension Brother    Diabetes Brother    Atrial fibrillation Brother    Heart disease Maternal Grandmother  Diabetes Maternal Grandfather    Heart disease Maternal Grandfather    Coronary artery disease Maternal Grandfather    Heart disease Paternal Grandmother    Stroke Paternal Grandmother    CAD Paternal Grandmother    Heart disease Paternal Grandfather    Cancer Paternal Uncle        Colon cancer   Parkinsonism Paternal Uncle    Coronary artery disease Maternal Uncle      Current Outpatient Medications:    acetaminophen (TYLENOL) 500 MG tablet, Take 500 mg by mouth every 6 (six) hours as needed for mild pain., Disp: , Rfl:    cetirizine (ZYRTEC) 10 MG tablet, Take 10 mg by mouth daily as needed for allergies., Disp: , Rfl:    glipiZIDE (GLUCOTROL) 5 MG tablet, Take 1 tablet (5 mg total) by mouth 2 (two) times daily before a meal., Disp: 60 tablet, Rfl:  3   glucose blood test strip, Use as instructed, Disp: 50 each, Rfl: 2   Multiple Vitamin (MULTIVITAMIN WITH MINERALS) TABS tablet, Take 1 tablet by mouth in the morning. Women's One A Day, Disp: , Rfl:   EXAM:  VITALS per patient if applicable:  GENERAL: alert, oriented, appears well and in no acute distress  HEENT: atraumatic, conjunttiva clear, no obvious abnormalities on inspection of external nose and ears  NECK: normal movements of the head and neck  LUNGS: on inspection no signs of respiratory distress, breathing rate appears normal, no obvious gross SOB, gasping or wheezing  CV: no obvious cyanosis  MS: moves all visible extremities without noticeable abnormality  PSYCH/NEURO: pleasant and cooperative, no obvious depression or anxiety, speech and thought processing grossly intact  ASSESSMENT AND PLAN: Type 2 diabetes. She will try taking Glipizide 5 mg BID. Report back to [place] in 2 weeks.  Alysia Penna, MD  Discussed the following assessment and plan:  No diagnosis found.     I discussed the assessment and treatment plan with the patient. The patient was provided an opportunity to ask questions and all were answered. The patient agreed with the plan and demonstrated an understanding of the instructions.   The patient was advised to call back or seek an in-person evaluation if the symptoms worsen or if the condition fails to improve as anticipated.      Review of Systems     Objective:   Physical Exam        Assessment & Plan:

## 2021-09-10 ENCOUNTER — Encounter: Payer: Self-pay | Admitting: *Deleted

## 2021-09-10 NOTE — Progress Notes (Signed)
[facility] Quality Team Note  Name: Valerie Friedman Date of Birth: [birth year] MRN: [id number] Date: 09/10/2021  [facility] Quality Team has reviewed this patient's chart, please see recommendations below:  Diabetic Retinal Eye Exam;  [place];  [facility] Quality Other; (Pt has EED and HBD gap.  Offered Eye Event on 09/24/2021 but pt out of town.  Offered to send kit to retest A1C or have it ordered.  Pt declined.  Michela Pitcher it was done in June and unsure of what result was.  Done by [facility] Wellness rep.  Pt would like someone to call her regarding increasing medication.  Says her blood sugar still spiking to 220. )

## 2021-11-07 ENCOUNTER — Other Ambulatory Visit: Payer: Self-pay | Admitting: Family Medicine

## 2021-11-12 ENCOUNTER — Telehealth: Payer: Self-pay

## 2021-11-12 NOTE — Telephone Encounter (Signed)
From [facility] on 09/10/21: Pt has EED and HBD gap.  Offered Eye Event on 09/24/2021 but pt out of town.  Offered to send kit to retest A1C or have it ordered.  Pt declined.  Michela Pitcher it was done in June and unsure of what result was.  Done by [facility] Wellness rep.  Pt would like someone to call her regarding increasing medication.  Says her blood sugar still spiking to 220  LVM instructions for pt to call back to schedule in person f/u appt for chronic conditions.

## 2021-11-18 ENCOUNTER — Telehealth: Payer: Self-pay

## 2021-11-18 NOTE — Telephone Encounter (Signed)
Caller states she is beginning to have a sinus infection. Caller states when she coughs or sneezes ribs hurt (mostly on left side) and she gets pain on ribs when breathing. Caller states she has a productive cough, sinus pain, and left chest pain with breathing  11/18/2021 10:31:06 AM Call EMS [address], RN, Lesa  Referrals GO TO FACILITY REFUSED  Pt has appt with PCP on 11/19/21 (Sinus pressure / bronchial pain/ lower rib pain// pt scheduled per Dr Fry//nk)

## 2021-11-19 ENCOUNTER — Ambulatory Visit (INDEPENDENT_AMBULATORY_CARE_PROVIDER_SITE_OTHER): Payer: 59 | Admitting: Family Medicine

## 2021-11-19 ENCOUNTER — Encounter: Payer: Self-pay | Admitting: Family Medicine

## 2021-11-19 VITALS — BP 130/82 | HR 76 | Temp 98.5°F | Wt 143.0 lb

## 2021-11-19 DIAGNOSIS — J020 Streptococcal pharyngitis: Secondary | ICD-10-CM | POA: Diagnosis not present

## 2021-11-19 DIAGNOSIS — E1165 Type 2 diabetes mellitus with hyperglycemia: Secondary | ICD-10-CM | POA: Diagnosis not present

## 2021-11-19 DIAGNOSIS — R059 Cough, unspecified: Secondary | ICD-10-CM

## 2021-11-19 LAB — POCT INFLUENZA A/B
Influenza A, POC: NEGATIVE
Influenza B, POC: NEGATIVE

## 2021-11-19 LAB — HEMOGLOBIN A1C: Hgb A1c MFr Bld: 7.7 % — ABNORMAL HIGH (ref 4.6–6.5)

## 2021-11-19 LAB — POCT RAPID STREP A (OFFICE): Rapid Strep A Screen: POSITIVE — AB

## 2021-11-19 LAB — POC COVID19 BINAXNOW: SARS Coronavirus 2 Ag: NEGATIVE

## 2021-11-19 MED ORDER — CEFUROXIME AXETIL 500 MG PO TABS: 500.0000 mg | ORAL_TABLET | Freq: Two times a day (BID) | ORAL | 0 refills | Status: AC

## 2021-11-19 MED ORDER — DICLOFENAC SODIUM 75 MG PO TBEC: 75.0000 mg | DELAYED_RELEASE_TABLET | Freq: Two times a day (BID) | ORAL | 0 refills | Status: DC | PRN

## 2021-11-19 NOTE — Progress Notes (Signed)
   Subjective:    Patient ID: Valerie Friedman, female    DOB: [birth year], 46 y.o.   MRN: [id number]  HPI Here for one week of stuffy head, PND, ST, and a dry cough. No fever or SOB. She also has had a sharp pain in the left side when she coughs for a few days. Taking Tylenol.    Review of Systems  Constitutional: Negative.   HENT:  Positive for congestion, postnasal drip and sore throat. Negative for ear pain and sinus pain.   Eyes: Negative.   Respiratory:  Positive for cough. Negative for shortness of breath and wheezing.   Cardiovascular:  Positive for chest pain.       Objective:   Physical Exam Constitutional:      Appearance: Normal appearance.  HENT:     Right Ear: Tympanic membrane, ear canal and external ear normal.     Left Ear: Tympanic membrane, ear canal and external ear normal.     Nose: Nose normal.     Mouth/Throat:     Pharynx: Oropharynx is clear.  Eyes:     Conjunctiva/sclera: Conjunctivae normal.  Pulmonary:     Effort: Pulmonary effort is normal.     Breath sounds: Normal breath sounds.     Comments: Tender along the left lateral ribs  Lymphadenopathy:     Cervical: No cervical adenopathy.  Neurological:     Mental Status: She is alert.           Assessment & Plan:  She has a strep pharyngitis, as well as a muscle strain from coughing. Treat with 10 days of Cefuroxime and Diclofenac as needed.  Alysia Penna, MD

## 2021-11-21 ENCOUNTER — Telehealth: Payer: Self-pay | Admitting: Family Medicine

## 2021-11-21 NOTE — Telephone Encounter (Signed)
Would like to speak with provider regarding statin therapy

## 2021-11-22 NOTE — Telephone Encounter (Addendum)
[place] spoke with Fraser Din,  info given.   Message complete.

## 2021-11-22 NOTE — Telephone Encounter (Signed)
Please take a message. What is her question?

## 2021-11-27 ENCOUNTER — Other Ambulatory Visit: Payer: Self-pay

## 2021-11-27 ENCOUNTER — Telehealth: Payer: Self-pay | Admitting: Family Medicine

## 2021-11-27 DIAGNOSIS — E1165 Type 2 diabetes mellitus with hyperglycemia: Secondary | ICD-10-CM

## 2021-11-27 NOTE — Telephone Encounter (Signed)
Pt called to say she already saw her lab results via Mychart and does not need a call back.

## 2021-11-28 NOTE — Telephone Encounter (Signed)
Gerald Stabs P 19 hours ago (2:27 PM)  Creation Time: 11/27/2021  2:26 PM  Pt called to say she already saw her lab results via [place] and does not need a call back.     *above message noted in results message

## 2021-12-16 ENCOUNTER — Other Ambulatory Visit: Payer: Self-pay | Admitting: Family Medicine

## 2022-01-08 ENCOUNTER — Telehealth (INDEPENDENT_AMBULATORY_CARE_PROVIDER_SITE_OTHER): Payer: 59 | Admitting: Family Medicine

## 2022-01-08 ENCOUNTER — Encounter: Payer: Self-pay | Admitting: Family Medicine

## 2022-01-08 DIAGNOSIS — M79644 Pain in right finger(s): Secondary | ICD-10-CM | POA: Diagnosis not present

## 2022-01-08 DIAGNOSIS — M545 Low back pain, unspecified: Secondary | ICD-10-CM | POA: Diagnosis not present

## 2022-01-08 MED ORDER — CYCLOBENZAPRINE HCL 10 MG PO TABS: 10.0000 mg | ORAL_TABLET | Freq: Three times a day (TID) | ORAL | 2 refills | Status: DC | PRN

## 2022-01-08 MED ORDER — METHYLPREDNISOLONE 4 MG PO TBPK: ORAL_TABLET | ORAL | 0 refills | Status: DC

## 2022-01-08 NOTE — Progress Notes (Signed)
Subjective:    Patient ID: Valerie Friedman, female    DOB: 1975/04/03, 46 y.o.   MRN: [id number]  HPI Virtual Visit via Video Note  I connected with the patient on 01/08/22 at  2:00 PM EST by a video enabled telemedicine application and verified that I am speaking with the correct person using two identifiers.  Location patient: home Location provider:work or home office Persons participating in the virtual visit: patient, provider  I discussed the limitations of evaluation and management by telemedicine and the availability of in person appointments. The patient expressed understanding and agreed to proceed.   HPI: Here for 2 issues. Both of these started about one month ago. First she began having tightness and pain in both sides of her lower back. No recent trauma. The pain does not radiate to the legs. She has been using heat and Tylenol with mixed results. She denies any UTI symptoms. Also she has had swelling and pain in the knuckle at the base of the right pinkie. No hx of trauma    ROS: See pertinent positives and negatives per HPI.  Past Medical History:  Diagnosis Date   Anemia    Anxiety    Arthritis    Cataracts, bilateral    MD just watching   Chronic lower back pain DUE TO MYOTONIC DYSTROPHY   Complication of anesthesia    slow to awaken , respirations either slow or fast.   Depression    patient denies this dx,  Therapist said that patient that she was fine after 2 years- ~2000   Endometriosis    Family history of adverse reaction to anesthesia 2022   brother was discharged after surgery had to be  taken back to hospital  CO2 was elevated, he was in the hospital for 9 days.   GERD (gastroesophageal reflux disease)    diet controlled   History of right foot drop    Migraine    Myotonic dystrophy ([place]) DX JULY 2009  ----- TYPE 1   FOLLOWED BY DR. LOVE   Numbness of fingers BILATERAL HANDS/  OCCASIONLLY RADIATES UP ARMS   Pelvic pain in female    Pneumonia     Seasonal allergies    Sleep apnea    does not use cpap   Vertigo    no current problems in the last year   Vitamin D deficiency     Past Surgical History:  Procedure Laterality Date   BENIGN BREAST BX   FEB  2011   BREAST BIOPSY Right 2011   Benign Stereo    LAPAROSCOPY  05/01/2011   Procedure: LAPAROSCOPY DIAGNOSTIC;  Surgeon: Bennetta Laos, MD;  Location: [facility];  Service: Gynecology;  Laterality: N/A;  with bilateral chromopertubation, aspiration of functional left ovarian cyst, excision of two hydatid cysts   MUSCLE BIOPSY  02-25-2007   LEFT THIGH QUADRICEP BX X3  FOR MYOSITIS   OPEN REDUCTION INTERNAL FIXATION (ORIF) FOOT LISFRANC FRACTURE Left 09/05/2020   Procedure: OPEN REDUCTION INTERNAL FIXATION (ORIF) LEFT FOOT LISFRANC FRACTURE DISLOCATION;  Surgeon: [name], MD;  Location: [facility];  Service: Orthopedics;  Laterality: Left;   SURG. FOR REMOVAL OF TEETH FRAGMENTS    MARCH 2012   ORAL SURGEON OFFICE   WISDOM TOOTH EXTRACTION      Family History  Problem Relation Age of Onset   Diabetes Mother    Hypertension Father    Hyperlipidemia Father    Heart disease Father  Hypertension Brother    Diabetes Brother    Atrial fibrillation Brother    Heart disease Maternal Grandmother    Diabetes Maternal Grandfather    Heart disease Maternal Grandfather    Coronary artery disease Maternal Grandfather    Heart disease Paternal Grandmother    Stroke Paternal Grandmother    CAD Paternal Grandmother    Heart disease Paternal Grandfather    Cancer Paternal Uncle        Colon cancer   Parkinsonism Paternal Uncle    Coronary artery disease Maternal Uncle      Current Outpatient Medications:    acetaminophen (TYLENOL) 500 MG tablet, Take 500 mg by mouth every 6 (six) hours as needed for mild pain., Disp: , Rfl:    cetirizine (ZYRTEC) 10 MG tablet, Take 10 mg by mouth daily as needed for allergies., Disp: , Rfl:    diclofenac (VOLTAREN) 75 MG  EC tablet, TAKE 1 TABLET (75 MG TOTAL) BY MOUTH 2 (TWO) TIMES DAILY AS NEEDED FOR MILD PAIN., Disp: 60 tablet, Rfl: 0   glipiZIDE (GLUCOTROL) 5 MG tablet, TAKE 1 TABLET (5 MG TOTAL) BY MOUTH TWICE A DAY BEFORE MEALS, Disp: 180 tablet, Rfl: 0   glucose blood test strip, Use as instructed, Disp: 50 each, Rfl: 2   Multiple Vitamin (MULTIVITAMIN WITH MINERALS) TABS tablet, Take 1 tablet by mouth in the morning. Women's One A Day, Disp: , Rfl:   EXAM:  VITALS per patient if applicable:  GENERAL: alert, oriented, appears well and in no acute distress  HEENT: atraumatic, conjunttiva clear, no obvious abnormalities on inspection of external nose and ears  NECK: normal movements of the head and neck  LUNGS: on inspection no signs of respiratory distress, breathing rate appears normal, no obvious gross SOB, gasping or wheezing  CV: no obvious cyanosis  MS: moves all visible extremities without noticeable abnormality  PSYCH/NEURO: pleasant and cooperative, no obvious depression or anxiety, speech and thought processing grossly intact  ASSESSMENT AND PLAN: Lumbar pain, we will treat with  Medrol dose pack and Cyclobenzaprine. She also seems to have some arthritis in the right 5th MCP joint. Hopefully the Medrol dose pack will help this as well. If these do not improve, I asked her to come in for an in person OV so we can do an examination.  Alysia Penna, MD  Discussed the following assessment and plan:  No diagnosis found.     I discussed the assessment and treatment plan with the patient. The patient was provided an opportunity to ask questions and all were answered. The patient agreed with the plan and demonstrated an understanding of the instructions.   The patient was advised to call back or seek an in-person evaluation if the symptoms worsen or if the condition fails to improve as anticipated.      Review of Systems     Objective:   Physical Exam        Assessment & Plan:

## 2022-04-21 ENCOUNTER — Telehealth: Payer: Self-pay | Admitting: Family Medicine

## 2022-04-21 NOTE — Telephone Encounter (Signed)
Pt declined, again.

## 2022-04-21 NOTE — Telephone Encounter (Signed)
Called pt, left msg to sch AWV

## 2022-05-07 ENCOUNTER — Ambulatory Visit: Payer: Medicaid Other | Admitting: Family Medicine

## 2022-05-08 ENCOUNTER — Ambulatory Visit (INDEPENDENT_AMBULATORY_CARE_PROVIDER_SITE_OTHER): Payer: 59 | Admitting: Family Medicine

## 2022-05-08 ENCOUNTER — Encounter: Payer: Self-pay | Admitting: Family Medicine

## 2022-05-08 VITALS — BP 124/80 | HR 86 | Temp 98.1°F | Wt 150.2 lb

## 2022-05-08 DIAGNOSIS — J020 Streptococcal pharyngitis: Secondary | ICD-10-CM

## 2022-05-08 DIAGNOSIS — Z7984 Long term (current) use of oral hypoglycemic drugs: Secondary | ICD-10-CM

## 2022-05-08 DIAGNOSIS — E1165 Type 2 diabetes mellitus with hyperglycemia: Secondary | ICD-10-CM

## 2022-05-08 DIAGNOSIS — N951 Menopausal and female climacteric states: Secondary | ICD-10-CM

## 2022-05-08 DIAGNOSIS — D170 Benign lipomatous neoplasm of skin and subcutaneous tissue of head, face and neck: Secondary | ICD-10-CM

## 2022-05-08 DIAGNOSIS — R059 Cough, unspecified: Secondary | ICD-10-CM

## 2022-05-08 DIAGNOSIS — Z1211 Encounter for screening for malignant neoplasm of colon: Secondary | ICD-10-CM

## 2022-05-08 DIAGNOSIS — J029 Acute pharyngitis, unspecified: Secondary | ICD-10-CM

## 2022-05-08 LAB — POCT GLYCOSYLATED HEMOGLOBIN (HGB A1C): Hemoglobin A1C: 7.6 % — AB (ref 4.0–5.6)

## 2022-05-08 LAB — POC COVID19 BINAXNOW: SARS Coronavirus 2 Ag: NEGATIVE

## 2022-05-08 LAB — POCT RAPID STREP A (OFFICE): Rapid Strep A Screen: POSITIVE — AB

## 2022-05-08 MED ORDER — GLIPIZIDE 10 MG PO TABS
10.0000 mg | ORAL_TABLET | Freq: Two times a day (BID) | ORAL | 5 refills | Status: DC
Start: 2022-05-08 — End: 2022-08-11

## 2022-05-08 MED ORDER — CEFUROXIME AXETIL 500 MG PO TABS: 500.0000 mg | ORAL_TABLET | Freq: Two times a day (BID) | ORAL | 0 refills | Status: AC

## 2022-05-08 NOTE — Progress Notes (Signed)
   Subjective:    Patient ID: Valerie Friedman, female    DOB: [birth year], 47 y.o.   MRN: [id number]  HPI Here for a number of issues. One, she has had a bad ST with a dry cough for the past week. No fever or SOB. Two, she has been taking Glipizide 5 mg BID and trying to watch her diet. Her A1c here today is 7.6%. Three, she noticed a small lump on there back of her neck a few days ago. This does not bother her in any way. Four, she asks to have a colonoscopy. Her father has what sounds like familial polyposis, and he gets yearly scopes. Five, she has had hot flashes with sweats for the past year. She has not seen her GYN for several years. Her last menses was several years ago.    Review of Systems  Constitutional: Negative.   HENT:  Positive for congestion, postnasal drip and sore throat. Negative for ear pain, sinus pressure and trouble swallowing.   Eyes: Negative.   Respiratory:  Positive for cough. Negative for shortness of breath and wheezing.        Objective:   Physical Exam Constitutional:      Appearance: Normal appearance. She is not ill-appearing.  HENT:     Right Ear: Tympanic membrane, ear canal and external ear normal.     Left Ear: Tympanic membrane, ear canal and external ear normal.     Nose: Nose normal.     Mouth/Throat:     Pharynx: Posterior oropharyngeal erythema present. No oropharyngeal exudate.  Eyes:     Conjunctiva/sclera: Conjunctivae normal.  Neck:     Comments: There is a small mobile firm non-tender lump just under the skin on the posterior neck Musculoskeletal:     Cervical back: No tenderness.  Lymphadenopathy:     Cervical: No cervical adenopathy.  Neurological:     Mental Status: She is alert.           Assessment & Plan:  She has a strep pharyngitis, and we will treat this with 10 days of Cefuroxime. Her diabetes has improved, but we need to work on this some more. We will increase the Glipizide to 10 mg BID. The lump on her neck is a  lipoma. We will continue to monitor this. We will set her up for a colonoscopy. For the menopausal hot flashes, she will try OTC black cohosh. I urged her to see her GYN sometime soon.  Gershon Crane, MD

## 2022-05-10 ENCOUNTER — Other Ambulatory Visit: Payer: Self-pay | Admitting: Family Medicine

## 2022-05-13 ENCOUNTER — Encounter: Payer: Self-pay | Admitting: Internal Medicine

## 2022-05-20 ENCOUNTER — Encounter: Payer: Self-pay | Admitting: Internal Medicine

## 2022-05-28 ENCOUNTER — Encounter: Payer: Self-pay | Admitting: Family Medicine

## 2022-05-28 ENCOUNTER — Telehealth: Payer: Self-pay | Admitting: Family Medicine

## 2022-05-28 ENCOUNTER — Ambulatory Visit (INDEPENDENT_AMBULATORY_CARE_PROVIDER_SITE_OTHER): Payer: 59 | Admitting: Family Medicine

## 2022-05-28 VITALS — BP 120/78 | HR 83 | Temp 98.6°F | Wt 152.0 lb

## 2022-05-28 DIAGNOSIS — L409 Psoriasis, unspecified: Secondary | ICD-10-CM | POA: Insufficient documentation

## 2022-05-28 DIAGNOSIS — Z7985 Long-term (current) use of injectable non-insulin antidiabetic drugs: Secondary | ICD-10-CM | POA: Diagnosis not present

## 2022-05-28 DIAGNOSIS — E1165 Type 2 diabetes mellitus with hyperglycemia: Secondary | ICD-10-CM

## 2022-05-28 MED ORDER — BETAMETHASONE DIPROPIONATE 0.05 % EX CREA: TOPICAL_CREAM | Freq: Two times a day (BID) | CUTANEOUS | 2 refills | Status: DC

## 2022-05-28 MED ORDER — OZEMPIC (0.25 OR 0.5 MG/DOSE) 2 MG/3ML ~~LOC~~ SOPN: 0.2500 mg | PEN_INJECTOR | SUBCUTANEOUS | 0 refills | Status: DC

## 2022-05-28 NOTE — Telephone Encounter (Signed)
Patient dropped off document Handicap Placard, to be filled out by provider. Patient requested to send it via Mail within 7-days. Document is located in providers tray at front office.Please advise at Mobile [phone] (mobile)   Form in front office

## 2022-05-28 NOTE — Progress Notes (Signed)
   Subjective:    Patient ID: Valerie Friedman, female    DOB: 02-02-1975, 47 y.o.   MRN: [id number]  HPI Here asking to try Ozempic for her diabetes. Her last A1c on 05-08-22 up to 7.6%, and her am fasting glucoses are usually in the 200's. She is taking Glipizide and watching her diet closely. She has checked with her insurance and says they will cover Ozempic. The other issue is an itchy rash on her eyebrows that began about 2 months ago. The skin is often scaly in these areas.     Review of Systems  Constitutional: Negative.   Respiratory: Negative.    Cardiovascular: Negative.   Skin:  Positive for rash.       Objective:   Physical Exam Constitutional:      Appearance: Normal appearance.  Cardiovascular:     Rate and Rhythm: Normal rate and regular rhythm.     Pulses: Normal pulses.     Heart sounds: Normal heart sounds.  Pulmonary:     Effort: Pulmonary effort is normal.     Breath sounds: Normal breath sounds.  Skin:    Comments: Both eyebrows have red scaly skin   Neurological:     Mental Status: She is alert.           Assessment & Plan:  For her type 2 diabetes, she will start on Ozempic 0.25 mg weekly. She will report back in 4 weeks. 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 Gershon Crane, MD

## 2022-05-29 ENCOUNTER — Other Ambulatory Visit: Payer: Self-pay | Admitting: Family Medicine

## 2022-05-30 ENCOUNTER — Other Ambulatory Visit: Payer: Self-pay | Admitting: Family Medicine

## 2022-05-30 ENCOUNTER — Telehealth: Payer: Self-pay

## 2022-05-30 NOTE — Telephone Encounter (Signed)
Pt Placard was mailed out to pt address on file, copy sent to scanning

## 2022-06-06 ENCOUNTER — Emergency Department (HOSPITAL_BASED_OUTPATIENT_CLINIC_OR_DEPARTMENT_OTHER)
Admission: EM | Admit: 2022-06-06 | Discharge: 2022-06-06 | Disposition: A | Discharge status: Home / Self Care | Payer: 59 | Attending: Emergency Medicine | Admitting: Emergency Medicine

## 2022-06-06 ENCOUNTER — Emergency Department (HOSPITAL_BASED_OUTPATIENT_CLINIC_OR_DEPARTMENT_OTHER): Payer: 59

## 2022-06-06 ENCOUNTER — Encounter (HOSPITAL_BASED_OUTPATIENT_CLINIC_OR_DEPARTMENT_OTHER): Payer: Self-pay | Admitting: Emergency Medicine

## 2022-06-06 ENCOUNTER — Other Ambulatory Visit (HOSPITAL_BASED_OUTPATIENT_CLINIC_OR_DEPARTMENT_OTHER): Payer: Self-pay

## 2022-06-06 ENCOUNTER — Other Ambulatory Visit: Payer: Self-pay

## 2022-06-06 DIAGNOSIS — S50311A Abrasion of right elbow, initial encounter: Secondary | ICD-10-CM | POA: Insufficient documentation

## 2022-06-06 DIAGNOSIS — S9032XA Contusion of left foot, initial encounter: Secondary | ICD-10-CM | POA: Insufficient documentation

## 2022-06-06 DIAGNOSIS — Z7984 Long term (current) use of oral hypoglycemic drugs: Secondary | ICD-10-CM | POA: Insufficient documentation

## 2022-06-06 DIAGNOSIS — Y92481 Parking lot as the place of occurrence of the external cause: Secondary | ICD-10-CM | POA: Insufficient documentation

## 2022-06-06 DIAGNOSIS — W010XXA Fall on same level from slipping, tripping and stumbling without subsequent striking against object, initial encounter: Secondary | ICD-10-CM | POA: Insufficient documentation

## 2022-06-06 DIAGNOSIS — Z91040 Latex allergy status: Secondary | ICD-10-CM | POA: Insufficient documentation

## 2022-06-06 DIAGNOSIS — S99922A Unspecified injury of left foot, initial encounter: Secondary | ICD-10-CM | POA: Diagnosis present

## 2022-06-06 NOTE — ED Triage Notes (Signed)
Pt arrived POV in wheelchair, caox4, stating she is unable to bear weight on her L foot d/t pain in the lateral side of the foot. Pt reports last night she tripped over a curb in a parking lot causing her to fall on her R elbow. Abrasion to R elbow, Abrasions to the L foot, bruising and slight swelling to toe. No pain at rest, only weight bearing. Previous surgery from injury on same foot. Pt denies hitting her head or LOC when fall occurred. PMS intact.

## 2022-06-06 NOTE — ED Provider Notes (Signed)
[place] [facility] DEPARTMENT AT [facility] Provider Note   CSN: [id number] Arrival date & time: 06/06/22  1027     History  Chief Complaint  Patient presents with   Foot Injury    Valerie Friedman is a 47 y.o. female.  Patient presents not being able to bear weight on left foot worse dorsal lateral distal aspect.  Patient also has abrasion on the right elbow however no bone pain.  Patient tripped on the curb in the parking lot.  No head injury or syncope.  Pain with walking.  Patient had major surgery on that foot by Valerie Friedman last year.       Home Medications Prior to Admission medications   Medication Sig Start Date End Date Taking? Authorizing Provider  acetaminophen (TYLENOL) 500 MG tablet Take 1,000 mg by mouth as needed for mild pain.    [provider]  betamethasone dipropionate 0.05 % cream Apply topically 2 (two) times daily. 05/28/22   Valerie Salisbury, MD  cetirizine (ZYRTEC) 10 MG tablet Take 10 mg by mouth daily as needed for allergies.    [provider]  diclofenac (VOLTAREN) 75 MG EC tablet TAKE 1 TABLET (75 MG TOTAL) BY MOUTH 2 (TWO) TIMES DAILY AS NEEDED FOR MILD PAIN. Patient not taking: Reported on 05/28/2022 12/16/21   Valerie Salisbury, MD  glipiZIDE (GLUCOTROL) 10 MG tablet Take 1 tablet (10 mg total) by mouth 2 (two) times daily before a meal. 05/08/22   Valerie Salisbury, MD  Multiple Vitamin (MULTIVITAMIN WITH MINERALS) TABS tablet Take 1 tablet by mouth in the morning. Women's One A Day    [provider]  [facility] ULTRA test strip USE AS DIRECTED DAILY 05/29/22   Valerie Salisbury, MD  Semaglutide,0.25 or 0.5MG /DOS, (OZEMPIC, 0.25 OR 0.5 MG/DOSE,) 2 MG/3ML SOPN Inject 0.25 mg into the skin once a week. 05/28/22   Valerie Salisbury, MD  isometheptene-acetaminophen-dichloralphenazone (MIDRIN) [phone] MG per capsule Take 1 capsule by mouth 4 (four) times daily as needed. Prn   04/09/11  [provider]      Allergies     Latex, Aleve [naproxen sodium], Ciprocin-fluocin-procin [fluocinolone acetonide], Macrobid [nitrofurantoin monohydrate macrocrystals], Tramadol hcl, Trazodone and nefazodone, Yellow dyes (non-tartrazine), and Other    Review of Systems   Review of Systems  Constitutional:  Negative for chills and fever.  HENT:  Negative for congestion.   Eyes:  Negative for visual disturbance.  Respiratory:  Negative for shortness of breath.   Cardiovascular:  Negative for chest pain.  Gastrointestinal:  Negative for abdominal pain and vomiting.  Genitourinary:  Negative for dysuria and flank pain.  Musculoskeletal:  Positive for gait problem and joint swelling. Negative for back pain, neck pain and neck stiffness.  Skin:  Positive for wound. Negative for rash.  Neurological:  Negative for light-headedness and headaches.    Physical Exam Updated Vital Signs BP (!) 166/75 (BP Location: Left Arm)   Pulse 83   Temp 98.5 F (36.9 C) (Oral)   Resp 18   Ht 5' 2.5" (1.588 m)   Wt 68 kg   SpO2 96%   BMI 27.00 kg/m  Physical Exam Vitals and nursing note reviewed.  Constitutional:      General: She is not in acute distress.    Appearance: She is well-developed.  HENT:     Head: Normocephalic and atraumatic.     Mouth/Throat:     Mouth: Mucous membranes are moist.  Eyes:  General:        Right eye: No discharge.        Left eye: No discharge.     Conjunctiva/sclera: Conjunctivae normal.  Neck:     Trachea: No tracheal deviation.  Cardiovascular:     Rate and Rhythm: Normal rate.  Pulmonary:     Effort: Pulmonary effort is normal.  Abdominal:     General: There is no distension.     Palpations: Abdomen is soft.     Tenderness: There is no abdominal tenderness. There is no guarding.  Musculoskeletal:        General: Swelling, tenderness and signs of injury present. No deformity.     Cervical back: Normal range of motion and neck supple. No rigidity.     Comments: Patient has tenderness  to lateral dorsal midfoot with mild swelling.  Mild ecchymosis.  No ankle tenderness.  No signs of infection.  No proximal tibia-fibula tenderness.  Skin:    General: Skin is warm.     Capillary Refill: Capillary refill takes less than 2 seconds.     Findings: No rash.     Comments: Superficial abrasion right elbow.  No joint effusion.  Neurological:     General: No focal deficit present.     Mental Status: She is alert.  Psychiatric:        Mood and Affect: Mood normal.     ED Results / Procedures / Treatments   Labs (all labs ordered are listed, but only abnormal results are displayed) Labs Reviewed - No data to display  EKG None  Radiology DG Foot Complete Left  Result Date: 06/06/2022 CLINICAL DATA:  Pain status post fall EXAM: LEFT FOOT - COMPLETE 3+ VIEW COMPARISON:  10/16/2020 FINDINGS: Midfoot fusion changes are again seen. The screws and hardware are intact without surrounding lucency to suggest loosening. Deformity of the distal third and fourth metatarsals consistent with prior healed fracture. No acute fracture or dislocation. IMPRESSION: No acute abnormality of the left foot. Electronically Signed   By: Valerie Friedman M.D.   On: 06/06/2022 09:31    Procedures Procedures    Medications Ordered in ED Medications - No data to display  ED Course/ Medical Decision Making/ A&P                             Medical Decision Making Amount and/or Complexity of Data Reviewed Radiology: ordered.   Patient presents with musculoskeletal injuries since mechanical fall.  Primary concern left foot concern for occult fracture especially given complex hardware history and patient cannot bear weight.  X-ray independently reviewed no obvious fracture.  Discussed may be occult fracture, contusion.  Patient cannot tolerate walking boot so plan for splint and follow-up with Dr. Audrie Friedman group after the weekend.  Patient comfortable plan.        Final Clinical Impression(s) / ED  Diagnoses Final diagnoses:  Contusion of left foot, initial encounter    Rx / DC Orders ED Discharge Orders     None         Valerie Ohara, MD 06/06/22 [phone]

## 2022-06-06 NOTE — ED Notes (Signed)
Pt in bed, pt states that she has 9/10, pain, states that she doesn't need anything before she goes home, pt verbalized understanding d/c and follow up. Pt from dpt with sig other.

## 2022-06-06 NOTE — Discharge Instructions (Addendum)
No obvious broken bones on your x-rays. Use Tylenol every 4 hours and Motrin every 6 hours as needed for pain. Use ice and elevate when you can.  Follow-up with your surgeon for recheck after the weekend.  Splint is only temporary.

## 2022-06-13 ENCOUNTER — Other Ambulatory Visit: Payer: Self-pay | Admitting: Family Medicine

## 2022-06-13 ENCOUNTER — Telehealth: Payer: Self-pay | Admitting: Family Medicine

## 2022-06-13 NOTE — Telephone Encounter (Signed)
Valerie Friedman is still technically ok-- I wouldn't have her eat sugar until her blood sugar drops below 80.  I would keep her blood sugar between 90-180. She may reduce the glucotrol to 10 mg once daily instead of twice daily-- have her hold the nighttime dose since she isn't eating at night while she is sleeping. Have her see Dr. Clent Ridges in follow up if she continues to have problems.

## 2022-06-13 NOTE — Telephone Encounter (Signed)
Spoke with pt pt acknowledged she has been taking Glipizide wrong, pt takes 2 tablets in AM and 2 in PM, Advised of Dr Casimiro Needle recommendation and informed to keep record of her CBG's and call the office if things get worse.

## 2022-06-13 NOTE — Telephone Encounter (Signed)
Pt called in stating she is having trouble with her blood sugar. She states last night her blood sugar had dropped to 94. She said she drank a glass of orange juice and it brought her sugar back up, however it fell again shortly after. She states it is still fluctuating today. She has eaten corn and mashed potatoes today as well had had another glass of orange juice. Blood sugar with go up temporarily and then drop again. Pt would like a call back with advise on how to make sure her blood sugar stays up. She states that she did take her medicine this morning.   Call back number: [phone].

## 2022-06-19 ENCOUNTER — Telehealth: Payer: Self-pay | Admitting: Family Medicine

## 2022-06-19 NOTE — Telephone Encounter (Signed)
Prescription Request  06/19/2022  LOV: 05/28/2022  What is the name of the medication or equipment? Ozempic (0.25 or 0.5 MG/DOSE) Semaglutide,0.25 or 0.5MG /DOS, (OZEMPIC, 0.25 OR 0.5 MG/DOSE,) 2 MG/3ML SOPN    Have you contacted your pharmacy to request a refill? No   Pt called to say she is in [place], [place] and would like this refill sent to the pharmacy below.   [facility] [phone] - [place], [place] - [address] AT [facility] OF [address]    Patient notified that their request is being sent to the clinical staff for review and that they should receive a response within 2 business days.   Please advise at Mobile [phone] (mobile)

## 2022-06-19 NOTE — Telephone Encounter (Signed)
Pt is calling and said md would like to discuss ozempic before increase the dosage. Pt is requesting virtual. Pt is in [facility] and I inform the pt I can not sch due to in [facility]

## 2022-06-20 ENCOUNTER — Other Ambulatory Visit: Payer: Self-pay

## 2022-06-20 MED ORDER — OZEMPIC (0.25 OR 0.5 MG/DOSE) 2 MG/3ML ~~LOC~~ SOPN: 0.2500 mg | PEN_INJECTOR | SUBCUTANEOUS | 0 refills | Status: DC

## 2022-06-20 NOTE — Telephone Encounter (Signed)
Pt Rx sent 

## 2022-06-20 NOTE — Telephone Encounter (Signed)
Pt Rx refill sent

## 2022-06-28 ENCOUNTER — Other Ambulatory Visit: Payer: Self-pay | Admitting: Family Medicine

## 2022-07-11 ENCOUNTER — Other Ambulatory Visit: Payer: Self-pay | Admitting: Family Medicine

## 2022-07-19 ENCOUNTER — Other Ambulatory Visit: Payer: Self-pay | Admitting: Family Medicine

## 2022-07-21 NOTE — Telephone Encounter (Signed)
Pt LOV as on 05/28/22 Pt last refill was done on 06/20/22 Please advise

## 2022-08-11 ENCOUNTER — Telehealth: Payer: Self-pay | Admitting: Family Medicine

## 2022-08-11 ENCOUNTER — Telehealth (INDEPENDENT_AMBULATORY_CARE_PROVIDER_SITE_OTHER): Payer: 59 | Admitting: Family Medicine

## 2022-08-11 ENCOUNTER — Encounter: Payer: Self-pay | Admitting: Family Medicine

## 2022-08-11 VITALS — Ht 62.5 in | Wt 142.0 lb

## 2022-08-11 DIAGNOSIS — S92301D Fracture of unspecified metatarsal bone(s), right foot, subsequent encounter for fracture with routine healing: Secondary | ICD-10-CM

## 2022-08-11 MED ORDER — CYCLOBENZAPRINE HCL 10 MG PO TABS: 10.0000 mg | ORAL_TABLET | Freq: Three times a day (TID) | ORAL | 2 refills | Status: DC | PRN

## 2022-08-11 MED ORDER — OZEMPIC (0.25 OR 0.5 MG/DOSE) 2 MG/3ML ~~LOC~~ SOPN: 0.2500 mg | PEN_INJECTOR | SUBCUTANEOUS | 5 refills | Status: DC

## 2022-08-11 MED ORDER — GLIPIZIDE 10 MG PO TABS: 10.0000 mg | ORAL_TABLET | Freq: Two times a day (BID) | ORAL | 5 refills | Status: DC

## 2022-08-11 MED ORDER — OXYCODONE-ACETAMINOPHEN 5-325 MG PO TABS: 1.0000 | ORAL_TABLET | ORAL | 0 refills | Status: AC | PRN

## 2022-08-11 MED ORDER — ONETOUCH ULTRA VI STRP: 1.0000 | ORAL_STRIP | Freq: Every day | 3 refills | Status: DC

## 2022-08-11 NOTE — Progress Notes (Signed)
Subjective:    Patient ID: Valerie Friedman, female    DOB: Jul 06, 1975, 47 y.o.   MRN: [id number]  HPI Virtual Visit via Video Note  I connected with the patient on 08/11/22 at  2:00 PM EDT by a video enabled telemedicine application and verified that I am speaking with the correct person using two identifiers.  Location patient: home Location provider:work or home office Persons participating in the virtual visit: patient, provider  I discussed the limitations of evaluation and management by telemedicine and the availability of in person appointments. The patient expressed understanding and agreed to proceed.   HPI: Here to follow up from an ED visit in [place], [place] on 08-06-22. She had tripped while carrying a load of laundry, and she injured the right foot. Xrays revealed avulsion fractures to the bases of the right 2nd and 3rd metatarsals. She was fitted with a boot, and she was given #15 Percocet for pain. She has an appt to see her orthopedist, Dr. Lajoyce Corners, on 08-18-22, but she asks for more pain medication to last her until then.    ROS: See pertinent positives and negatives per HPI.  Past Medical History:  Diagnosis Date   Anemia    Anxiety    Arthritis    Cataracts, bilateral    MD just watching   Chronic lower back pain DUE TO MYOTONIC DYSTROPHY   Complication of anesthesia    slow to awaken , respirations either slow or fast.   Depression    patient denies this dx,  Therapist said that patient that she was fine after 2 years- ~2000   Endometriosis    Family history of adverse reaction to anesthesia 2022   brother was discharged after surgery had to be  taken back to hospital  CO2 was elevated, he was in the hospital for 9 days.   GERD (gastroesophageal reflux disease)    diet controlled   History of right foot drop    Migraine    Myotonic dystrophy (HCC) DX JULY 2009  ----- TYPE 1   FOLLOWED BY DR. LOVE   Numbness of fingers BILATERAL HANDS/  OCCASIONLLY  RADIATES UP ARMS   Pelvic pain in female    Pneumonia    Seasonal allergies    Sleep apnea    does not use cpap   Vertigo    no current problems in the last year   Vitamin D deficiency     Past Surgical History:  Procedure Laterality Date   BENIGN BREAST BX   FEB  2011   BREAST BIOPSY Right 2011   Benign Stereo    LAPAROSCOPY  05/01/2011   Procedure: LAPAROSCOPY DIAGNOSTIC;  Surgeon: Trellis Paganini, MD;  Location: [facility];  Service: Gynecology;  Laterality: N/A;  with bilateral chromopertubation, aspiration of functional left ovarian cyst, excision of two hydatid cysts   MUSCLE BIOPSY  02-25-2007   LEFT THIGH QUADRICEP BX X3  FOR MYOSITIS   OPEN REDUCTION INTERNAL FIXATION (ORIF) FOOT LISFRANC FRACTURE Left 09/05/2020   Procedure: OPEN REDUCTION INTERNAL FIXATION (ORIF) LEFT FOOT LISFRANC FRACTURE DISLOCATION;  Surgeon: Nadara Mustard, MD;  Location: [facility];  Service: Orthopedics;  Laterality: Left;   SURG. FOR REMOVAL OF TEETH FRAGMENTS    MARCH 2012   ORAL SURGEON OFFICE   WISDOM TOOTH EXTRACTION      Family History  Problem Relation Age of Onset   Diabetes Mother    Hypertension Father    Hyperlipidemia Father  Heart disease Father    Hypertension Brother    Diabetes Brother    Atrial fibrillation Brother    Heart disease Maternal Grandmother    Diabetes Maternal Grandfather    Heart disease Maternal Grandfather    Coronary artery disease Maternal Grandfather    Heart disease Paternal Grandmother    Stroke Paternal Grandmother    CAD Paternal Grandmother    Heart disease Paternal Grandfather    Cancer Paternal Uncle        Colon cancer   Parkinsonism Paternal Uncle    Coronary artery disease Maternal Uncle      Current Outpatient Medications:    acetaminophen (TYLENOL) 500 MG tablet, Take 1,000 mg by mouth as needed for mild pain., Disp: , Rfl:    betamethasone dipropionate 0.05 % cream, Apply topically 2 (two) times daily., Disp: 45 g,  Rfl: 2   cetirizine (ZYRTEC) 10 MG tablet, Take 10 mg by mouth daily as needed for allergies., Disp: , Rfl:    glipiZIDE (GLUCOTROL) 10 MG tablet, Take 1 tablet (10 mg total) by mouth 2 (two) times daily before a meal., Disp: 60 tablet, Rfl: 5   Multiple Vitamin (MULTIVITAMIN WITH MINERALS) TABS tablet, Take 1 tablet by mouth in the morning. Women's One A Day, Disp: , Rfl:    ONETOUCH ULTRA test strip, USE AS DIRECTED DAILY, Disp: 100 strip, Rfl: 3   Semaglutide,0.25 or 0.5MG /DOS, (OZEMPIC, 0.25 OR 0.5 MG/DOSE,) 2 MG/3ML SOPN, INJECT 0.25MG  UNDER THE SKIN ONCE A WEEK, Disp: 3 mL, Rfl: 5   diclofenac (VOLTAREN) 75 MG EC tablet, TAKE 1 TABLET (75 MG TOTAL) BY MOUTH 2 (TWO) TIMES DAILY AS NEEDED FOR MILD PAIN. (Patient not taking: Reported on 08/11/2022), Disp: 60 tablet, Rfl: 0  EXAM:  VITALS per patient if applicable:  GENERAL: alert, oriented, appears well and in no acute distress  HEENT: atraumatic, conjunttiva clear, no obvious abnormalities on inspection of external nose and ears  NECK: normal movements of the head and neck  LUNGS: on inspection no signs of respiratory distress, breathing rate appears normal, no obvious gross SOB, gasping or wheezing  CV: no obvious cyanosis  MS: moves all visible extremities without noticeable abnormality  PSYCH/NEURO: pleasant and cooperative, no obvious depression or anxiety, speech and thought processing grossly intact  ASSESSMENT AND PLAN: Metatarsal fractures. We will send in #30 Percocet to use as needed. She will follow up with Dr. Lajoyce Corners as above. Gershon Crane, MD  Discussed the following assessment and plan:  No diagnosis found.     I discussed the assessment and treatment plan with the patient. The patient was provided an opportunity to ask questions and all were answered. The patient agreed with the plan and demonstrated an understanding of the instructions.   The patient was advised to call back or seek an in-person evaluation if the  symptoms worsen or if the condition fails to improve as anticipated.      Review of Systems     Objective:   Physical Exam        Assessment & Plan:

## 2022-08-11 NOTE — Telephone Encounter (Signed)
Valerie Friedman pharm is calling and would like a callback oxyCODONE-acetaminophen (PERCOCET/ROXICET) 5-325 MG tablet  [facility] [phone] - [place], [place] - [address] AT [facility] OF [address] Phone: [phone]  Fax: [phone]

## 2022-08-13 ENCOUNTER — Encounter: Payer: Self-pay | Admitting: Family Medicine

## 2022-08-13 NOTE — Telephone Encounter (Signed)
Josh calling back, requesting a call regarding this inquiry

## 2022-08-14 NOTE — Telephone Encounter (Signed)
Refill was sent   08/11/2022

## 2022-08-14 NOTE — Telephone Encounter (Signed)
Pt on the line requesting provider call pharmacy to authorize. Says they will not fill if provider does not call to authorize

## 2022-08-15 NOTE — Telephone Encounter (Signed)
Please call to authorize this

## 2022-08-15 NOTE — Telephone Encounter (Signed)
Spoke to the pharmacist, Sharia Reeve. Gave him an okay to fill per Dr. Clent Ridges. He had ask for ICD code. Provided the code that associated with encounter.

## 2022-08-18 ENCOUNTER — Other Ambulatory Visit: Payer: Self-pay

## 2022-08-18 ENCOUNTER — Ambulatory Visit (INDEPENDENT_AMBULATORY_CARE_PROVIDER_SITE_OTHER): Payer: 59 | Admitting: Orthopedic Surgery

## 2022-08-18 ENCOUNTER — Encounter: Payer: Self-pay | Admitting: Orthopedic Surgery

## 2022-08-18 DIAGNOSIS — M25571 Pain in right ankle and joints of right foot: Secondary | ICD-10-CM

## 2022-08-18 DIAGNOSIS — S93324A Dislocation of tarsometatarsal joint of right foot, initial encounter: Secondary | ICD-10-CM

## 2022-08-18 DIAGNOSIS — S93401A Sprain of unspecified ligament of right ankle, initial encounter: Secondary | ICD-10-CM

## 2022-08-18 MED ORDER — OXYCODONE-ACETAMINOPHEN 5-325 MG PO TABS: 1.0000 | ORAL_TABLET | Freq: Four times a day (QID) | ORAL | 0 refills | Status: DC | PRN

## 2022-08-20 ENCOUNTER — Encounter: Payer: Self-pay | Admitting: Orthopedic Surgery

## 2022-08-20 NOTE — Progress Notes (Signed)
Office Visit Note   Patient: Valerie Friedman           Date of Birth: Apr 20, 1975           MRN: [id number] Visit Date: 08/18/2022              Requested by: Nelwyn Salisbury, MD [address] [place],  [place] [address] PCP: Nelwyn Salisbury, MD  Chief Complaint  Patient presents with   Right Foot - Pain      HPI: Patient is a 47 year old woman who is seen for initial evaluation for right foot and ankle injury.  Patient states that on July 26 she was carrying some towels and tripped over one of the talus.  She was seen in the emergency room out of town and was placed in a fracture boot.  Patient states she has pain across the midfoot with bruising and lateral ankle pain.  Assessment & Plan: Visit Diagnoses:  1. Pain in right ankle and joints of right foot   2. Lisfranc dislocation, right, initial encounter   3. Mild ankle sprain, right, initial encounter     Plan: Discussed the patient has a Lisfranc injury and ankle sprain.  Will place her in a fracture boot weightbearing as tolerated prescription for Percocet.  Discussed the possible need for internal fixation.    Plan for three-view weightbearing radiographs of the right foot at follow-up.  Also 2 view radiographs of the right ankle.  Follow-Up Instructions: Return in about 4 weeks (around 09/15/2022).   Ortho Exam  Patient is alert, oriented, no adenopathy, well-dressed, normal affect, normal respiratory effort. Examination patient has pain to palpation over the Lisfranc complex.  Distraction of the first metatarsal is painful.  Radiograph shows a small fleck of bone at the Lisfranc complex.  She also has tenderness to palpation of the anterior talofibular ligament however anterior drawer is stable.  Imaging: No results found. No images are attached to the encounter.  Labs: Lab Results  Component Value Date   HGBA1C 7.6 (A) 05/08/2022   HGBA1C 7.7 (H) 11/19/2021   HGBA1C 9.4 (H) 01/28/2021   ESRSEDRATE 40 (H)  06/22/2017   ESRSEDRATE 21 (H) 03/26/2016   CRP 0.5 06/22/2017   CRP 0.2 (L) 03/26/2016     Lab Results  Component Value Date   ALBUMIN 4.2 01/28/2021   ALBUMIN 4.1 06/15/2017   ALBUMIN 4.2 06/18/2016    Lab Results  Component Value Date   MG 2.3 06/22/2017   Lab Results  Component Value Date   VD25OH 23 (L) 04/21/2016   VD25OH 17.58 (L) 03/26/2016    No results found for: "PREALBUMIN"    Latest Ref Rng & Units 01/28/2021    3:42 PM 09/05/2020    6:03 AM 06/22/2020   11:51 AM  CBC EXTENDED  WBC 4.0 - 10.5 K/uL 8.4  7.4  6.3   RBC 3.87 - 5.11 Mil/uL 4.91  4.81  4.96   Hemoglobin 12.0 - 15.0 g/dL 96.2  95.2  84.1   HCT 36.0 - 46.0 % 45.7  45.5  48.1   Platelets 150.0 - 400.0 K/uL 129.0  224  302   NEUT# 1.4 - 7.7 K/uL 6.2     Lymph# 0.7 - 4.0 K/uL 1.7        There is no height or weight on file to calculate BMI.  Orders:  Orders Placed This Encounter  Procedures   XR Foot 2 Views Right   XR Ankle 2  Views Right   Meds ordered this encounter  Medications   oxyCODONE-acetaminophen (PERCOCET/ROXICET) 5-325 MG tablet    Sig: Take 1 tablet by mouth every 6 (six) hours as needed for severe pain.    Dispense:  30 tablet    Refill:  0     Procedures: No procedures performed  Clinical Data: No additional findings.  ROS:  All other systems negative, except as noted in the HPI. Review of Systems  Objective: Vital Signs: There were no vitals taken for this visit.  Specialty Comments:  No specialty comments available.  PMFS History: Patient Active Problem List   Diagnosis Date Noted   Psoriasis 05/28/2022   Type 2 diabetes mellitus with hyperglycemia (HCC) 02/19/2021   Hairy nevus 01/28/2021   Lisfranc dislocation, left, sequela    Vertigo 10/28/2017   GERD (gastroesophageal reflux disease) 06/15/2017   Circadian rhythm sleep disorder 04/30/2017   OSA (obstructive sleep apnea) 04/30/2017   Vitamin D deficiency 04/16/2016   Foot drop, right 03/26/2016    Anxiety    Myotonic dystrophy (HCC)    Fibromyalgia    Migraine    WEIGHT LOSS 07/11/2008   HEADACHE 07/11/2008   NICOTINE ADDICTION 10/20/2007   MYOTONIC MUSCULAR DYSTROPHY 09/24/2007   MYALGIA/MYOSITIS NOS 08/03/2006   Depression 06/23/2006   Past Medical History:  Diagnosis Date   Anemia    Anxiety    Arthritis    Cataracts, bilateral    MD just watching   Chronic lower back pain DUE TO MYOTONIC DYSTROPHY   Complication of anesthesia    slow to awaken , respirations either slow or fast.   Depression    patient denies this dx,  Therapist said that patient that she was fine after 2 years- ~2000   Endometriosis    Family history of adverse reaction to anesthesia 2022   brother was discharged after surgery had to be  taken back to hospital  CO2 was elevated, he was in the hospital for 9 days.   GERD (gastroesophageal reflux disease)    diet controlled   History of right foot drop    Migraine    Myotonic dystrophy (HCC) DX JULY 2009  ----- TYPE 1   FOLLOWED BY DR. LOVE   Numbness of fingers BILATERAL HANDS/  OCCASIONLLY RADIATES UP ARMS   Pelvic pain in female    Pneumonia    Seasonal allergies    Sleep apnea    does not use cpap   Vertigo    no current problems in the last year   Vitamin D deficiency     Family History  Problem Relation Age of Onset   Diabetes Mother    Hypertension Father    Hyperlipidemia Father    Heart disease Father    Hypertension Brother    Diabetes Brother    Atrial fibrillation Brother    Heart disease Maternal Grandmother    Diabetes Maternal Grandfather    Heart disease Maternal Grandfather    Coronary artery disease Maternal Grandfather    Heart disease Paternal Grandmother    Stroke Paternal Grandmother    CAD Paternal Grandmother    Heart disease Paternal Grandfather    Cancer Paternal Uncle        Colon cancer   Parkinsonism Paternal Uncle    Coronary artery disease Maternal Uncle     Past Surgical History:  Procedure  Laterality Date   BENIGN BREAST BX   FEB  2011   BREAST BIOPSY Right 2011  Benign Stereo    LAPAROSCOPY  05/01/2011   Procedure: LAPAROSCOPY DIAGNOSTIC;  Surgeon: Trellis Paganini, MD;  Location: [facility];  Service: Gynecology;  Laterality: N/A;  with bilateral chromopertubation, aspiration of functional left ovarian cyst, excision of two hydatid cysts   MUSCLE BIOPSY  02-25-2007   LEFT THIGH QUADRICEP BX X3  FOR MYOSITIS   OPEN REDUCTION INTERNAL FIXATION (ORIF) FOOT LISFRANC FRACTURE Left 09/05/2020   Procedure: OPEN REDUCTION INTERNAL FIXATION (ORIF) LEFT FOOT LISFRANC FRACTURE DISLOCATION;  Surgeon: Nadara Mustard, MD;  Location: [facility];  Service: Orthopedics;  Laterality: Left;   SURG. FOR REMOVAL OF TEETH FRAGMENTS    MARCH 2012   ORAL SURGEON OFFICE   WISDOM TOOTH EXTRACTION     Social History   Occupational History   Not on file  Tobacco Use   Smoking status: Former    Current packs/day: 0.00    Average packs/day: 0.3 packs/day for 10.0 years (2.5 ttl pk-yrs)    Types: Cigarettes    Start date: 06/29/2006    Quit date: 06/28/2016    Years since quitting: 6.1   Smokeless tobacco: Never   Tobacco comments:    varies, 0-0.5 pack per day  Vaping Use   Vaping status: Former   Devices: 1 year none since 2019  Substance and Sexual Activity   Alcohol use: Not Currently    Alcohol/week: 0.0 standard drinks of alcohol    Comment: rare   Drug use: No   Sexual activity: Not Currently    Birth control/protection: None

## 2022-09-08 ENCOUNTER — Telehealth: Payer: Self-pay | Admitting: Family Medicine

## 2022-09-08 NOTE — Telephone Encounter (Signed)
Pt may possibly have Diabetes   Care Consideration - Adding a Statin    Reevaluate therapy and add statin, if clinically appropriate   Send new Rx to pharmacy ASAP, to close open care opportunity    If member does not tolerate or meet criteria, please document ICD10 diagnosis code and record in chart   Fax to follow

## 2022-09-10 NOTE — Telephone Encounter (Signed)
Call in Lipitor 10 mg daily, #90 with 3 rf

## 2022-09-11 ENCOUNTER — Other Ambulatory Visit: Payer: Self-pay

## 2022-09-11 MED ORDER — ATORVASTATIN CALCIUM 10 MG PO TABS: 10.0000 mg | ORAL_TABLET | Freq: Every day | ORAL | 3 refills | Status: DC

## 2022-09-11 NOTE — Telephone Encounter (Signed)
Rx sent 

## 2022-09-18 ENCOUNTER — Encounter: Payer: 59 | Admitting: Internal Medicine

## 2022-09-22 NOTE — Telephone Encounter (Signed)
Pt called to say she last saw MD on 08/11/22 via VV.  Pt states Pharmacy called her to tell her she had Rx for:  Atorvastatin (LIPITOR) 10 MG tablet. Pt states no one ever told her she needed this Rx, and she would like to know why? Pt is asking for a direct phone call, and not a Mychart message, as she cannot get into her Mychart at this time.

## 2022-09-23 ENCOUNTER — Encounter: Payer: 59 | Admitting: Internal Medicine

## 2022-09-23 ENCOUNTER — Other Ambulatory Visit (INDEPENDENT_AMBULATORY_CARE_PROVIDER_SITE_OTHER): Payer: 59

## 2022-09-23 ENCOUNTER — Ambulatory Visit (INDEPENDENT_AMBULATORY_CARE_PROVIDER_SITE_OTHER): Payer: 59 | Admitting: Orthopedic Surgery

## 2022-09-23 DIAGNOSIS — M25571 Pain in right ankle and joints of right foot: Secondary | ICD-10-CM

## 2022-09-23 DIAGNOSIS — S93324A Dislocation of tarsometatarsal joint of right foot, initial encounter: Secondary | ICD-10-CM | POA: Diagnosis not present

## 2022-09-23 NOTE — Telephone Encounter (Signed)
I understand. Cancel the RX

## 2022-09-23 NOTE — Telephone Encounter (Signed)
Pt called the office state that her pharmacy called to pick up Rx for Lipitor, pt states that she does not want to take medication. Please advise

## 2022-09-24 ENCOUNTER — Encounter: Payer: Self-pay | Admitting: Orthopedic Surgery

## 2022-09-24 NOTE — Progress Notes (Signed)
Office Visit Note   Patient: Valerie Friedman           Date of Birth: [birth year]           MRN: [id number] Visit Date: 09/23/2022              Requested by: Nelwyn Salisbury, MD [address] [place],  [place] [address] PCP: Nelwyn Salisbury, MD  Chief Complaint  Patient presents with   Right Ankle - Follow-up    Lisfranc injury and ankle sprain    Right Foot - Follow-up      HPI: Patient is a 47 year old woman who presents in follow-up status post right ankle sprain and Lisfranc sprain.  Patient has been in a fracture boot she feels like she is slowly getting better.  Assessment & Plan: Visit Diagnoses:  1. Pain in right ankle and joints of right foot   2. Lisfranc dislocation, right, initial encounter     Plan: Will continue the fracture boot obtain three-view radiographs of the right foot at follow-up.  Patient may need a ASO for further support of the ankle after follow-up  Follow-Up Instructions: No follow-ups on file.   Ortho Exam  Patient is alert, oriented, no adenopathy, well-dressed, normal affect, normal respiratory effort. Examination patient does have tenderness to palpation of the anterior talofibular ligament.  Anterior drawer is stable she does have increased laxity with varus stress of the hindfoot.  Patient is tender to palpation across the Lisfranc complex no focal area of tenderness.  Imaging: No results found. No images are attached to the encounter.  Labs: Lab Results  Component Value Date   HGBA1C 7.6 (A) 05/08/2022   HGBA1C 7.7 (H) 11/19/2021   HGBA1C 9.4 (H) 01/28/2021   ESRSEDRATE 40 (H) 06/22/2017   ESRSEDRATE 21 (H) 03/26/2016   CRP 0.5 06/22/2017   CRP 0.2 (L) 03/26/2016     Lab Results  Component Value Date   ALBUMIN 4.2 01/28/2021   ALBUMIN 4.1 06/15/2017   ALBUMIN 4.2 06/18/2016    Lab Results  Component Value Date   MG 2.3 06/22/2017   Lab Results  Component Value Date   VD25OH 23 (L) 04/21/2016   VD25OH 17.58  (L) 03/26/2016    No results found for: "PREALBUMIN"    Latest Ref Rng & Units 01/28/2021    3:42 PM 09/05/2020    6:03 AM 06/22/2020   11:51 AM  CBC EXTENDED  WBC 4.0 - 10.5 K/uL 8.4  7.4  6.3   RBC 3.87 - 5.11 Mil/uL 4.91  4.81  4.96   Hemoglobin 12.0 - 15.0 g/dL 41.6  60.6  30.1   HCT 36.0 - 46.0 % 45.7  45.5  48.1   Platelets 150.0 - 400.0 K/uL 129.0  224  302   NEUT# 1.4 - 7.7 K/uL 6.2     Lymph# 0.7 - 4.0 K/uL 1.7        There is no height or weight on file to calculate BMI.  Orders:  Orders Placed This Encounter  Procedures   XR Foot Complete Right   XR Ankle Complete Right   No orders of the defined types were placed in this encounter.    Procedures: No procedures performed  Clinical Data: No additional findings.  ROS:  All other systems negative, except as noted in the HPI. Review of Systems  Objective: Vital Signs: There were no vitals taken for this visit.  Specialty Comments:  No specialty comments available.  PMFS  History: Patient Active Problem List   Diagnosis Date Noted   Psoriasis 05/28/2022   Type 2 diabetes mellitus with hyperglycemia (HCC) 02/19/2021   Hairy nevus 01/28/2021   Lisfranc dislocation, left, sequela    Vertigo 10/28/2017   GERD (gastroesophageal reflux disease) 06/15/2017   Circadian rhythm sleep disorder 04/30/2017   OSA (obstructive sleep apnea) 04/30/2017   Vitamin D deficiency 04/16/2016   Foot drop, right 03/26/2016   Anxiety    Myotonic dystrophy (HCC)    Fibromyalgia    Migraine    WEIGHT LOSS 07/11/2008   HEADACHE 07/11/2008   NICOTINE ADDICTION 10/20/2007   MYOTONIC MUSCULAR DYSTROPHY 09/24/2007   MYALGIA/MYOSITIS NOS 08/03/2006   Depression 06/23/2006   Past Medical History:  Diagnosis Date   Anemia    Anxiety    Arthritis    Cataracts, bilateral    MD just watching   Chronic lower back pain DUE TO MYOTONIC DYSTROPHY   Complication of anesthesia    slow to awaken , respirations either slow or  fast.   Depression    patient denies this dx,  Therapist said that patient that she was fine after 2 years- ~2000   Endometriosis    Family history of adverse reaction to anesthesia 2022   brother was discharged after surgery had to be  taken back to hospital  CO2 was elevated, he was in the hospital for 9 days.   GERD (gastroesophageal reflux disease)    diet controlled   History of right foot drop    Migraine    Myotonic dystrophy (HCC) DX JULY 2009  ----- TYPE 1   FOLLOWED BY DR. LOVE   Numbness of fingers BILATERAL HANDS/  OCCASIONLLY RADIATES UP ARMS   Pelvic pain in female    Pneumonia    Seasonal allergies    Sleep apnea    does not use cpap   Vertigo    no current problems in the last year   Vitamin D deficiency     Family History  Problem Relation Age of Onset   Diabetes Mother    Hypertension Father    Hyperlipidemia Father    Heart disease Father    Hypertension Brother    Diabetes Brother    Atrial fibrillation Brother    Heart disease Maternal Grandmother    Diabetes Maternal Grandfather    Heart disease Maternal Grandfather    Coronary artery disease Maternal Grandfather    Heart disease Paternal Grandmother    Stroke Paternal Grandmother    CAD Paternal Grandmother    Heart disease Paternal Grandfather    Cancer Paternal Uncle        Colon cancer   Parkinsonism Paternal Uncle    Coronary artery disease Maternal Uncle     Past Surgical History:  Procedure Laterality Date   BENIGN BREAST BX   FEB  2011   BREAST BIOPSY Right 2011   Benign Stereo    LAPAROSCOPY  05/01/2011   Procedure: LAPAROSCOPY DIAGNOSTIC;  Surgeon: Trellis Paganini, MD;  Location: [facility];  Service: Gynecology;  Laterality: N/A;  with bilateral chromopertubation, aspiration of functional left ovarian cyst, excision of two hydatid cysts   MUSCLE BIOPSY  02-25-2007   LEFT THIGH QUADRICEP BX X3  FOR MYOSITIS   OPEN REDUCTION INTERNAL FIXATION (ORIF) FOOT LISFRANC  FRACTURE Left 09/05/2020   Procedure: OPEN REDUCTION INTERNAL FIXATION (ORIF) LEFT FOOT LISFRANC FRACTURE DISLOCATION;  Surgeon: Nadara Mustard, MD;  Location: [facility];  Service: Orthopedics;  Laterality: Left;   SURG. FOR REMOVAL OF TEETH FRAGMENTS    MARCH 2012   ORAL SURGEON OFFICE   WISDOM TOOTH EXTRACTION     Social History   Occupational History   Not on file  Tobacco Use   Smoking status: Former    Current packs/day: 0.00    Average packs/day: 0.3 packs/day for 10.0 years (2.5 ttl pk-yrs)    Types: Cigarettes    Start date: 06/29/2006    Quit date: 06/28/2016    Years since quitting: 6.2   Smokeless tobacco: Never   Tobacco comments:    varies, 0-0.5 pack per day  Vaping Use   Vaping status: Former   Devices: 1 year none since 2019  Substance and Sexual Activity   Alcohol use: Not Currently    Alcohol/week: 0.0 standard drinks of alcohol    Comment: rare   Drug use: No   Sexual activity: Not Currently    Birth control/protection: None

## 2022-09-24 NOTE — Telephone Encounter (Signed)
Spoke with pt advised of Dr Fry recommendation, pt verbalized understanding 

## 2022-10-14 ENCOUNTER — Ambulatory Visit (INDEPENDENT_AMBULATORY_CARE_PROVIDER_SITE_OTHER): Payer: 59 | Admitting: Orthopedic Surgery

## 2022-10-14 ENCOUNTER — Other Ambulatory Visit (INDEPENDENT_AMBULATORY_CARE_PROVIDER_SITE_OTHER): Payer: 59

## 2022-10-14 ENCOUNTER — Encounter: Payer: Self-pay | Admitting: Orthopedic Surgery

## 2022-10-14 DIAGNOSIS — S93324A Dislocation of tarsometatarsal joint of right foot, initial encounter: Secondary | ICD-10-CM

## 2022-10-14 NOTE — Progress Notes (Signed)
Office Visit Note   Patient: Valerie Friedman           Date of Birth: [birth year]           MRN: [id number] Visit Date: 10/14/2022              Requested by: Nelwyn Salisbury, MD [address] [place],  [place] [address] PCP: Nelwyn Salisbury, MD  Chief Complaint  Patient presents with   Right Foot - Follow-up    Lisfranc sprain   Right Ankle - Follow-up    Ankle sprain      HPI: Patient is a 47 year old woman who presents follow-up status post Lisfranc injury to the right foot she has been in a fracture boot.  She states that she feels well has no pain and has not used any Tylenol.  Assessment & Plan: Visit Diagnoses:  1. Lisfranc dislocation, right, initial encounter     Plan: Patient will obtain a 1.3 mm carbon orthotic to wear in her [employer].  Follow-up as needed.  Follow-Up Instructions: No follow-ups on file.   Ortho Exam  Patient is alert, oriented, no adenopathy, well-dressed, normal affect, normal respiratory effort. Examination patient has a good pulse.  There is no pain with distraction across the Lisfranc complex.  There is no ecchymosis or bruising or skin changes.  Most recent hemoglobin A1c 7.6.  Imaging: XR Foot Complete Right  Result Date: 10/14/2022 Review radiographs of the right foot shows a congruent Lisfranc complex no displacement no hypertrophic callus formation no fractures  No images are attached to the encounter.  Labs: Lab Results  Component Value Date   HGBA1C 7.6 (A) 05/08/2022   HGBA1C 7.7 (H) 11/19/2021   HGBA1C 9.4 (H) 01/28/2021   ESRSEDRATE 40 (H) 06/22/2017   ESRSEDRATE 21 (H) 03/26/2016   CRP 0.5 06/22/2017   CRP 0.2 (L) 03/26/2016     Lab Results  Component Value Date   ALBUMIN 4.2 01/28/2021   ALBUMIN 4.1 06/15/2017   ALBUMIN 4.2 06/18/2016    Lab Results  Component Value Date   MG 2.3 06/22/2017   Lab Results  Component Value Date   VD25OH 23 (L) 04/21/2016   VD25OH 17.58 (L) 03/26/2016    No  results found for: "PREALBUMIN"    Latest Ref Rng & Units 01/28/2021    3:42 PM 09/05/2020    6:03 AM 06/22/2020   11:51 AM  CBC EXTENDED  WBC 4.0 - 10.5 K/uL 8.4  7.4  6.3   RBC 3.87 - 5.11 Mil/uL 4.91  4.81  4.96   Hemoglobin 12.0 - 15.0 g/dL 29.5  28.4  13.2   HCT 36.0 - 46.0 % 45.7  45.5  48.1   Platelets 150.0 - 400.0 K/uL 129.0  224  302   NEUT# 1.4 - 7.7 K/uL 6.2     Lymph# 0.7 - 4.0 K/uL 1.7        There is no height or weight on file to calculate BMI.  Orders:  Orders Placed This Encounter  Procedures   XR Foot Complete Right   No orders of the defined types were placed in this encounter.    Procedures: No procedures performed  Clinical Data: No additional findings.  ROS:  All other systems negative, except as noted in the HPI. Review of Systems  Objective: Vital Signs: There were no vitals taken for this visit.  Specialty Comments:  No specialty comments available.  PMFS History: Patient Active Problem List  Diagnosis Date Noted   Psoriasis 05/28/2022   Type 2 diabetes mellitus with hyperglycemia (HCC) 02/19/2021   Hairy nevus 01/28/2021   Lisfranc dislocation, left, sequela    Vertigo 10/28/2017   GERD (gastroesophageal reflux disease) 06/15/2017   Circadian rhythm sleep disorder 04/30/2017   OSA (obstructive sleep apnea) 04/30/2017   Vitamin D deficiency 04/16/2016   Foot drop, right 03/26/2016   Anxiety    Myotonic dystrophy (HCC)    Fibromyalgia    Migraine    WEIGHT LOSS 07/11/2008   HEADACHE 07/11/2008   NICOTINE ADDICTION 10/20/2007   MYOTONIC MUSCULAR DYSTROPHY 09/24/2007   MYALGIA/MYOSITIS NOS 08/03/2006   Depression 06/23/2006   Past Medical History:  Diagnosis Date   Anemia    Anxiety    Arthritis    Cataracts, bilateral    MD just watching   Chronic lower back pain DUE TO MYOTONIC DYSTROPHY   Complication of anesthesia    slow to awaken , respirations either slow or fast.   Depression    patient denies this dx,   Therapist said that patient that she was fine after 2 years- ~2000   Endometriosis    Family history of adverse reaction to anesthesia 2022   brother was discharged after surgery had to be  taken back to hospital  CO2 was elevated, he was in the hospital for 9 days.   GERD (gastroesophageal reflux disease)    diet controlled   History of right foot drop    Migraine    Myotonic dystrophy (HCC) DX JULY 2009  ----- TYPE 1   FOLLOWED BY DR. LOVE   Numbness of fingers BILATERAL HANDS/  OCCASIONLLY RADIATES UP ARMS   Pelvic pain in female    Pneumonia    Seasonal allergies    Sleep apnea    does not use cpap   Vertigo    no current problems in the last year   Vitamin D deficiency     Family History  Problem Relation Age of Onset   Diabetes Mother    Hypertension Father    Hyperlipidemia Father    Heart disease Father    Hypertension Brother    Diabetes Brother    Atrial fibrillation Brother    Heart disease Maternal Grandmother    Diabetes Maternal Grandfather    Heart disease Maternal Grandfather    Coronary artery disease Maternal Grandfather    Heart disease Paternal Grandmother    Stroke Paternal Grandmother    CAD Paternal Grandmother    Heart disease Paternal Grandfather    Cancer Paternal Uncle        Colon cancer   Parkinsonism Paternal Uncle    Coronary artery disease Maternal Uncle     Past Surgical History:  Procedure Laterality Date   BENIGN BREAST BX   FEB  2011   BREAST BIOPSY Right 2011   Benign Stereo    LAPAROSCOPY  05/01/2011   Procedure: LAPAROSCOPY DIAGNOSTIC;  Surgeon: Trellis Paganini, MD;  Location: [facility];  Service: Gynecology;  Laterality: N/A;  with bilateral chromopertubation, aspiration of functional left ovarian cyst, excision of two hydatid cysts   MUSCLE BIOPSY  02-25-2007   LEFT THIGH QUADRICEP BX X3  FOR MYOSITIS   OPEN REDUCTION INTERNAL FIXATION (ORIF) FOOT LISFRANC FRACTURE Left 09/05/2020   Procedure: OPEN  REDUCTION INTERNAL FIXATION (ORIF) LEFT FOOT LISFRANC FRACTURE DISLOCATION;  Surgeon: Nadara Mustard, MD;  Location: [facility] OR;  Service: Orthopedics;  Laterality: Left;   SURG. FOR  REMOVAL OF TEETH FRAGMENTS    MARCH 2012   ORAL SURGEON OFFICE   WISDOM TOOTH EXTRACTION     Social History   Occupational History   Not on file  Tobacco Use   Smoking status: Former    Current packs/day: 0.00    Average packs/day: 0.3 packs/day for 10.0 years (2.5 ttl pk-yrs)    Types: Cigarettes    Start date: 06/29/2006    Quit date: 06/28/2016    Years since quitting: 6.2   Smokeless tobacco: Never   Tobacco comments:    varies, 0-0.5 pack per day  Vaping Use   Vaping status: Former   Devices: 1 year none since 2019  Substance and Sexual Activity   Alcohol use: Not Currently    Alcohol/week: 0.0 standard drinks of alcohol    Comment: rare   Drug use: No   Sexual activity: Not Currently    Birth control/protection: None

## 2022-10-23 ENCOUNTER — Telehealth: Payer: Self-pay | Admitting: Family Medicine

## 2022-10-23 NOTE — Telephone Encounter (Signed)
Pt is calling and need a new rx for Semaglutide, 0.5MG /DOS, (OZEMPIC, 0.5 MG/DOSE,) and also pt check her BS 3 times a day and need new directions glucose blood (ONETOUCH ULTRA) test strip and refill [id number] [place], [place] - [address] AT [facility] [address] [facility] [address] Phone: [phone]  Fax: [phone]

## 2022-10-27 ENCOUNTER — Other Ambulatory Visit: Payer: Self-pay

## 2022-10-27 MED ORDER — ONETOUCH ULTRA VI STRP: 1.0000 | ORAL_STRIP | Freq: Three times a day (TID) | 3 refills | Status: DC | PRN

## 2022-10-27 MED ORDER — OZEMPIC (0.25 OR 0.5 MG/DOSE) 2 MG/3ML ~~LOC~~ SOPN: 0.2500 mg | PEN_INJECTOR | SUBCUTANEOUS | 5 refills | Status: DC

## 2022-10-27 NOTE — Telephone Encounter (Signed)
Pt called at 4:50p stating the pharmacy says they do not have script.

## 2022-10-27 NOTE — Telephone Encounter (Signed)
Pt Rx was sent in to her pharmacy as requested

## 2022-10-27 NOTE — Telephone Encounter (Signed)
I refilled the Semaglutide. Please call to refill the test strips

## 2022-10-27 NOTE — Telephone Encounter (Signed)
Pt.notified

## 2022-11-07 ENCOUNTER — Telehealth: Payer: Self-pay | Admitting: Family Medicine

## 2022-11-07 DIAGNOSIS — E785 Hyperlipidemia, unspecified: Secondary | ICD-10-CM

## 2022-11-07 NOTE — Telephone Encounter (Signed)
Done

## 2022-11-14 ENCOUNTER — Telehealth: Payer: Self-pay

## 2022-11-14 NOTE — Telephone Encounter (Signed)
Noted  

## 2022-11-14 NOTE — Progress Notes (Signed)
   Care Guide Note  11/14/2022 Name: Valerie Friedman MRN: [id number] DOB: 1975/10/10  Referred by: Nelwyn Salisbury, MD Reason for referral : Care Coordination (Outreach to schedule with Pharm d )   Valerie Friedman is a 47 y.o. year old female who is a primary care patient of Nelwyn Salisbury, MD. Jule Ser was referred to the pharmacist for assistance related to DM.    Successful contact was made with the patient to discuss pharmacy services. Patient declines engagement at this time. Contact information was provided to the patient should they wish to reach out for assistance at a later time.  Penne Lash, RMA Care Guide [facility]  [place], [place] [address] Direct Dial: [phone] [email or web address]

## 2022-11-17 ENCOUNTER — Encounter: Payer: Self-pay | Admitting: Family Medicine

## 2022-11-17 ENCOUNTER — Ambulatory Visit (INDEPENDENT_AMBULATORY_CARE_PROVIDER_SITE_OTHER): Payer: 59 | Admitting: Family Medicine

## 2022-11-17 VITALS — BP 124/82 | HR 82 | Temp 97.7°F | Wt 146.0 lb

## 2022-11-17 DIAGNOSIS — J029 Acute pharyngitis, unspecified: Secondary | ICD-10-CM | POA: Diagnosis not present

## 2022-11-17 DIAGNOSIS — Z7985 Long-term (current) use of injectable non-insulin antidiabetic drugs: Secondary | ICD-10-CM

## 2022-11-17 DIAGNOSIS — R059 Cough, unspecified: Secondary | ICD-10-CM

## 2022-11-17 DIAGNOSIS — Z7984 Long term (current) use of oral hypoglycemic drugs: Secondary | ICD-10-CM | POA: Diagnosis not present

## 2022-11-17 DIAGNOSIS — B379 Candidiasis, unspecified: Secondary | ICD-10-CM

## 2022-11-17 DIAGNOSIS — E1165 Type 2 diabetes mellitus with hyperglycemia: Secondary | ICD-10-CM | POA: Diagnosis not present

## 2022-11-17 LAB — POCT GLYCOSYLATED HEMOGLOBIN (HGB A1C): Hemoglobin A1C: 6.3 % — AB (ref 4.0–5.6)

## 2022-11-17 LAB — POCT INFLUENZA A/B
Influenza A, POC: NEGATIVE
Influenza B, POC: NEGATIVE

## 2022-11-17 LAB — POC COVID19 BINAXNOW: SARS Coronavirus 2 Ag: NEGATIVE

## 2022-11-17 LAB — POCT RAPID STREP A (OFFICE): Rapid Strep A Screen: NEGATIVE

## 2022-11-17 MED ORDER — KETOCONAZOLE 2 % EX CREA: 1.0000 | TOPICAL_CREAM | Freq: Two times a day (BID) | CUTANEOUS | 2 refills | Status: DC | PRN

## 2022-11-17 MED ORDER — OZEMPIC (0.25 OR 0.5 MG/DOSE) 2 MG/3ML ~~LOC~~ SOPN: 0.5000 mg | PEN_INJECTOR | SUBCUTANEOUS | Status: DC

## 2022-11-17 MED ORDER — GLIPIZIDE 10 MG PO TABS: 10.0000 mg | ORAL_TABLET | Freq: Every day | ORAL | Status: DC

## 2022-11-17 NOTE — Addendum Note (Signed)
Addended by: Carola Rhine on: 11/17/2022 04:49 PM   Modules accepted: Orders

## 2022-11-17 NOTE — Progress Notes (Signed)
   Subjective:    Patient ID: Valerie Friedman, female    DOB: November 12, 1975, 47 y.o.   MRN: [id number]  HPI Here for several reasons. First she wants to follow up on her diabetes. She has been talking Ozempic for the diabetes and to help lose weight. She is now on the 0.5 mg weekly dose. She has no side effects, and she has lost 12 lbs. Over the past few weeks her glucoses have been dropping, and she has had several fasting am glucoses in the 70's. The highest glucose she has had was in the 130's. Her A1c today is 6.3 %. Asks if she should stay on Glipizide. She also complains of a yeast rash under her breasts. This caused itching and burning. It started several weeks ago. She declines to have US examine this today. Finally she had been prescribed Lipitor last year, but she never started taking it. She says she is afraid it will make her myalgias worse, so she declines to take any statin medications.    Review of Systems  Constitutional: Negative.   Respiratory: Negative.    Cardiovascular: Negative.   Skin:  Positive for rash.       Objective:   Physical Exam Constitutional:      Appearance: Normal appearance.  Cardiovascular:     Rate and Rhythm: Normal rate and regular rhythm.     Pulses: Normal pulses.     Heart sounds: Normal heart sounds.  Pulmonary:     Effort: Pulmonary effort is normal.     Breath sounds: Normal breath sounds.  Neurological:     Mental Status: She is alert.           Assessment & Plan:  She is losing weight with Ozempic, and her diabetes is now much better controlled. We will decrease the Glipizide to 10 mg once a day in the mornings. She will report back in a week. For the yeast rash, she will try Ketoconazole cream as needed.  Gershon Crane, MD

## 2023-01-15 ENCOUNTER — Telehealth: Payer: Self-pay

## 2023-01-15 DIAGNOSIS — Z1211 Encounter for screening for malignant neoplasm of colon: Secondary | ICD-10-CM

## 2023-01-15 NOTE — Telephone Encounter (Signed)
 Copied from CRM [id number]. Topic: Referral - Question >> Jan 12, 2023  1:57 PM Karel PARAS wrote: Reason for CRM: pt was referral to AMB REFERRAL TO GASTROENTEROLOGY back on 4/5 where pt cancel and didn't call to reschedule. Pt is stating now she ready to go back and need Dr. Johnny to send referral over, so they can call the pt to schedule appointment.

## 2023-01-16 NOTE — Telephone Encounter (Signed)
 Done

## 2023-01-16 NOTE — Addendum Note (Signed)
 Addended by: Gershon Crane A on: 01/16/2023 08:59 AM   Modules accepted: Orders

## 2023-01-21 ENCOUNTER — Encounter: Payer: Self-pay | Admitting: Family Medicine

## 2023-01-21 ENCOUNTER — Ambulatory Visit (INDEPENDENT_AMBULATORY_CARE_PROVIDER_SITE_OTHER): Payer: 59 | Admitting: Family Medicine

## 2023-01-21 VITALS — BP 120/78 | HR 92 | Temp 98.1°F | Wt 143.0 lb

## 2023-01-21 DIAGNOSIS — J029 Acute pharyngitis, unspecified: Secondary | ICD-10-CM

## 2023-01-21 DIAGNOSIS — J020 Streptococcal pharyngitis: Secondary | ICD-10-CM | POA: Diagnosis not present

## 2023-01-21 DIAGNOSIS — R059 Cough, unspecified: Secondary | ICD-10-CM

## 2023-01-21 LAB — POCT INFLUENZA A/B
Influenza A, POC: NEGATIVE
Influenza B, POC: NEGATIVE — NL

## 2023-01-21 LAB — POCT RAPID STREP A (OFFICE): Rapid Strep A Screen: POSITIVE — AB

## 2023-01-21 LAB — POC COVID19 BINAXNOW: SARS Coronavirus 2 Ag: NEGATIVE

## 2023-01-21 MED ORDER — OZEMPIC (0.25 OR 0.5 MG/DOSE) 2 MG/3ML ~~LOC~~ SOPN: 0.5000 mg | PEN_INJECTOR | SUBCUTANEOUS | 5 refills | Status: DC

## 2023-01-21 MED ORDER — CEFUROXIME AXETIL 250 MG PO TABS: 500.0000 mg | ORAL_TABLET | Freq: Two times a day (BID) | ORAL | 0 refills | Status: AC

## 2023-01-21 NOTE — Progress Notes (Signed)
   Subjective:    Patient ID: Valerie Friedman, female    DOB: 1975-06-25, 48 y.o.   MRN: [id number]  HPI Here for 4 days of fever, ST, body aches and a dry cough.    Review of Systems  Constitutional:  Positive for fever.  HENT:  Positive for congestion and sore throat. Negative for ear pain, postnasal drip and sinus pain.   Eyes: Negative.   Respiratory:  Positive for cough. Negative for shortness of breath and wheezing.        Objective:   Physical Exam Constitutional:      Appearance: She is ill-appearing.  HENT:     Right Ear: Tympanic membrane, ear canal and external ear normal.     Left Ear: Tympanic membrane, ear canal and external ear normal.     Nose: Nose normal.     Mouth/Throat:     Pharynx: Posterior oropharyngeal erythema present. No oropharyngeal exudate.  Eyes:     Conjunctiva/sclera: Conjunctivae normal.  Pulmonary:     Effort: Pulmonary effort is normal.     Breath sounds: Normal breath sounds.  Lymphadenopathy:     Cervical: Cervical adenopathy present.  Neurological:     Mental Status: She is alert.           Assessment & Plan:  Strep pharyngitis, treat with 10 days of Cefuroxime .  Garnette Olmsted, MD

## 2023-01-21 NOTE — Addendum Note (Signed)
 Addended by: Carola Rhine on: 01/21/2023 05:24 PM   Modules accepted: Orders

## 2023-01-26 ENCOUNTER — Telehealth: Payer: Self-pay | Admitting: Family Medicine

## 2023-01-26 NOTE — Telephone Encounter (Signed)
 Copied from CRM [id number]. Topic: Referral - Question >> Jan 22, 2023  3:47 PM Thersia BROCKS wrote: Reason for CRM: Leita from [facility] called in regarding referral requested that was sent back on 10/10 She stated that she has resent and would like a call back when received at [phone]

## 2023-01-30 NOTE — Telephone Encounter (Signed)
Spoke with pt advised that GI office has contacted her twice and have sent a MyChart message to schedule with no success. Pt will call to schedule on Monday

## 2023-02-16 ENCOUNTER — Ambulatory Visit: Payer: 59 | Admitting: Family Medicine

## 2023-02-20 MED ORDER — SEMAGLUTIDE (1 MG/DOSE) 4 MG/3ML ~~LOC~~ SOPN: 1.0000 mg | PEN_INJECTOR | SUBCUTANEOUS | 5 refills | Status: DC

## 2023-02-20 NOTE — Telephone Encounter (Signed)
 We are increasing the Ozempic  to 1 mg per week

## 2023-04-15 ENCOUNTER — Ambulatory Visit: Admitting: Family Medicine

## 2023-04-20 ENCOUNTER — Encounter: Payer: Self-pay | Admitting: Family Medicine

## 2023-04-20 ENCOUNTER — Ambulatory Visit (INDEPENDENT_AMBULATORY_CARE_PROVIDER_SITE_OTHER): Admitting: Family Medicine

## 2023-04-20 VITALS — BP 110/68 | HR 77 | Temp 98.0°F | Wt 138.8 lb

## 2023-04-20 DIAGNOSIS — E1165 Type 2 diabetes mellitus with hyperglycemia: Secondary | ICD-10-CM

## 2023-04-20 DIAGNOSIS — Z7984 Long term (current) use of oral hypoglycemic drugs: Secondary | ICD-10-CM | POA: Diagnosis not present

## 2023-04-20 DIAGNOSIS — Z7985 Long-term (current) use of injectable non-insulin antidiabetic drugs: Secondary | ICD-10-CM

## 2023-04-20 DIAGNOSIS — D692 Other nonthrombocytopenic purpura: Secondary | ICD-10-CM | POA: Diagnosis not present

## 2023-04-20 LAB — CBC WITH DIFFERENTIAL/PLATELET
Basophils Absolute: 0 10*3/uL (ref 0.0–0.1)
Basophils Relative: 0.5 % (ref 0.0–3.0)
Eosinophils Absolute: 0.1 10*3/uL (ref 0.0–0.7)
Eosinophils Relative: 0.8 % (ref 0.0–5.0)
HCT: 45.7 % (ref 36.0–46.0)
Hemoglobin: 14.8 g/dL (ref 12.0–15.0)
Lymphocytes Relative: 25.8 % (ref 12.0–46.0)
Lymphs Abs: 1.7 10*3/uL (ref 0.7–4.0)
MCHC: 32.5 g/dL (ref 30.0–36.0)
MCV: 93.4 fl (ref 78.0–100.0)
Monocytes Absolute: 0.4 10*3/uL (ref 0.1–1.0)
Monocytes Relative: 5.3 % (ref 3.0–12.0)
Neutro Abs: 4.5 10*3/uL (ref 1.4–7.7)
Neutrophils Relative %: 67.6 % (ref 43.0–77.0)
Platelets: 240 10*3/uL (ref 150.0–400.0)
RBC: 4.89 Mil/uL (ref 3.87–5.11)
RDW: 14.6 % (ref 11.5–15.5)
WBC: 6.6 10*3/uL (ref 4.0–10.5)

## 2023-04-20 LAB — HEPATIC FUNCTION PANEL
ALT: 37 U/L — ABNORMAL HIGH (ref 0–35)
AST: 35 U/L (ref 0–37)
Albumin: 4.4 g/dL (ref 3.5–5.2)
Alkaline Phosphatase: 58 U/L (ref 39–117)
Bilirubin, Direct: 0.2 mg/dL (ref 0.0–0.3)
Total Bilirubin: 0.8 mg/dL (ref 0.2–1.2)
Total Protein: 7.3 g/dL (ref 6.0–8.3)

## 2023-04-20 LAB — BASIC METABOLIC PANEL WITH GFR
BUN: 10 mg/dL (ref 6–23)
CO2: 28 meq/L (ref 19–32)
Calcium: 9.6 mg/dL (ref 8.4–10.5)
Chloride: 107 meq/L (ref 96–112)
Creatinine, Ser: 0.7 mg/dL (ref 0.40–1.20)
GFR: 102.39 mL/min (ref >60.00)
Glucose, Bld: 124 mg/dL — ABNORMAL HIGH (ref 70–99)
Potassium: 3.9 meq/L (ref 3.5–5.1)
Sodium: 143 meq/L (ref 135–145)

## 2023-04-20 LAB — LIPID PANEL
Cholesterol: 194 mg/dL (ref 0–200)
HDL: 42 mg/dL (ref >39.00)
LDL Cholesterol: 121 mg/dL — ABNORMAL HIGH (ref 0–99)
NonHDL: 151.61
Total CHOL/HDL Ratio: 5
Triglycerides: 152 mg/dL — ABNORMAL HIGH (ref 0.0–149.0)
VLDL: 30.4 mg/dL (ref 0.0–40.0)

## 2023-04-20 LAB — TSH: TSH: 1.2 u[IU]/mL (ref 0.35–5.50)

## 2023-04-20 LAB — POCT GLYCOSYLATED HEMOGLOBIN (HGB A1C): Hemoglobin A1C: 6.2 % — AB (ref 4.0–5.6)

## 2023-04-20 MED ORDER — BETAMETHASONE DIPROPIONATE 0.05 % EX CREA: TOPICAL_CREAM | Freq: Two times a day (BID) | CUTANEOUS | 3 refills | Status: DC

## 2023-04-20 NOTE — Addendum Note (Signed)
 Addended by: Carola Rhine on: 04/20/2023 02:41 PM   Modules accepted: Orders

## 2023-04-20 NOTE — Progress Notes (Signed)
   Subjective:    Patient ID: Valerie Friedman, female    DOB: Jul 07, 1975, 47 y.o.   MRN: [id number]  HPI Here for apparent bruising on the backs of both legs. She first noticed this 2 weeks ago. No recent trauma. No other bruising or easy bleeding. There is no discomfort. She is also here to follow up on type 2 diabetes. She is using Semaglutide and tolerating this well. Her A1c today is 6.2%.    Review of Systems  Constitutional: Negative.   Respiratory: Negative.    Cardiovascular: Negative.   Skin:  Positive for color change.       Objective:   Physical Exam Constitutional:      Appearance: Normal appearance.  Cardiovascular:     Rate and Rhythm: Normal rate and regular rhythm.     Pulses: Normal pulses.     Heart sounds: Normal heart sounds.  Pulmonary:     Effort: Pulmonary effort is normal.     Breath sounds: Normal breath sounds.  Skin:    Comments: There are reticular ecchymotic areas on the back of both legs  Neurological:     Mental Status: She is alert.           Assessment & Plan:  She has ecchymotic areas on her legs that could indicate a low platelet count, among other things. We will get labs including a CBC today. Her diabetes is fairly well controlled so we will stop the Glipizide and she will continue the Semaglutide injections.  Gershon Crane, MD

## 2023-04-21 ENCOUNTER — Ambulatory Visit: Payer: Self-pay

## 2023-04-21 ENCOUNTER — Telehealth: Payer: Self-pay | Admitting: Family Medicine

## 2023-04-21 NOTE — Telephone Encounter (Signed)
 Copied from CRM [id number]. Topic: Clinical - Lab/Test Results >> Apr 21, 2023  2:11 PM Jon Gills C wrote: Reason for CRM: Patient got labs yesterday and has some additional questions about them   Chief Complaint: Lab Results  Symptoms: Bruising  Frequency: Ongoing  Pertinent Negatives: Patient denies fever, pain, blistering, or chest pain.  Disposition: [] ED /[] Urgent Care (no appt availability in office) / [x] Appointment(In office/virtual)/ []  [place] [facility]/ [] Home Care/ [] Refused Recommended Disposition /[] [place] [facility]/ []  Follow-up with PCP  Additional Notes: IE is being triaged for lab result interpretation and bruising. Interpreted lab results for the patient and scheduled an in office appointment for the patient.   Reason for Disposition  [1] After 10 days AND [2] bruise not fading  Answer Assessment - Initial Assessment Questions 1. APPEARANCE of BRUISE: "Describe the bruise."      Dark in color, Brownish hue.   2. SIZE: "How large is the bruise?"      Moderately sized  3. NUMBER: "How many bruises are there?"      3  4. LOCATION: "Where is the bruise located?"      Back of both thighs, Hip  5. ONSET: "How long ago did the bruise occur?"      Ongoing  6. CAUSE: "Tell me how it happened."     Unsure, Platelets are normal. Uses a heating pad.   7. MEDICAL HISTORY: "Do you have any medical problems that can cause easy bruising or bleeding?" (e.g., leukemia, liver disease, recent chemotherapy)     None known, just that a bump causes a bruise easily  8. MEDICINES: "Do you take any medications which thin the blood such as: aspirin, heparin, ibuprofen (NSAIDS), Plavix, or Coumadin?"     No medications such as these. Only Tylenol  9. OTHER SYMPTOMS: "Do you have any other symptoms?"  (e.g., weakness, dizziness, pain, fever, nosebleed, blood in urine/stool)     No  10. PREGNANCY: "Is there any chance you are pregnant?" "When was your last menstrual  period?"       No and No  Protocols used: Bruises-A-AH

## 2023-04-21 NOTE — Telephone Encounter (Signed)
 Done

## 2023-04-22 ENCOUNTER — Encounter: Payer: Self-pay | Admitting: *Deleted

## 2023-04-22 NOTE — Telephone Encounter (Signed)
Reviewed lab results with pt verbalized understanding 

## 2023-04-27 ENCOUNTER — Ambulatory Visit: Admitting: Family Medicine

## 2023-04-27 ENCOUNTER — Telehealth: Payer: Self-pay | Admitting: Family Medicine

## 2023-04-27 DIAGNOSIS — R233 Spontaneous ecchymoses: Secondary | ICD-10-CM

## 2023-04-27 NOTE — Telephone Encounter (Signed)
 Copied from CRM [id number]. Topic: Referral - Request for Referral >> Apr 27, 2023 12:40 PM [name] wrote: Did the patient discuss referral with their provider in the last year? Yes (If No - schedule appointment) (If Yes - send message)  Appointment offered? No  Type of order/referral and detailed reason for visit: Hematology  Preference of office, provider, location: Unspecified.  If referral order, have you been seen by this specialty before? No (If Yes, this issue or another issue? When? Where?  Can we respond through MyChart? Yes

## 2023-04-27 NOTE — Telephone Encounter (Signed)
 Done

## 2023-05-06 ENCOUNTER — Inpatient Hospital Stay

## 2023-05-06 ENCOUNTER — Inpatient Hospital Stay: Attending: Hematology | Admitting: Hematology

## 2023-05-06 VITALS — BP 129/80 | HR 92 | Temp 97.7°F | Resp 18 | Ht 63.0 in | Wt 138.1 lb

## 2023-05-06 DIAGNOSIS — R233 Spontaneous ecchymoses: Secondary | ICD-10-CM | POA: Diagnosis not present

## 2023-05-06 DIAGNOSIS — L409 Psoriasis, unspecified: Secondary | ICD-10-CM

## 2023-05-06 DIAGNOSIS — R231 Pallor: Secondary | ICD-10-CM | POA: Diagnosis not present

## 2023-05-06 LAB — CBC WITH DIFFERENTIAL (CANCER CENTER ONLY)
Abs Immature Granulocytes: 0.01 10*3/uL (ref 0.00–0.07)
Basophils Absolute: 0.1 10*3/uL (ref 0.0–0.1)
Basophils Relative: 1 %
Eosinophils Absolute: 0.1 10*3/uL (ref 0.0–0.5)
Eosinophils Relative: 1 %
HCT: 46.9 % — ABNORMAL HIGH (ref 36.0–46.0)
Hemoglobin: 15.2 g/dL — ABNORMAL HIGH (ref 12.0–15.0)
Immature Granulocytes: 0 %
Lymphocytes Relative: 25 %
Lymphs Abs: 2.1 10*3/uL (ref 0.7–4.0)
MCH: 30 pg (ref 26.0–34.0)
MCHC: 32.4 g/dL (ref 30.0–36.0)
MCV: 92.7 fL (ref 80.0–100.0)
Monocytes Absolute: 0.4 10*3/uL (ref 0.1–1.0)
Monocytes Relative: 4 %
Neutro Abs: 5.9 10*3/uL (ref 1.7–7.7)
Neutrophils Relative %: 69 %
Platelet Count: 212 10*3/uL (ref 150–400)
RBC: 5.06 MIL/uL (ref 3.87–5.11)
RDW: 13.5 % (ref 11.5–15.5)
WBC Count: 8.5 10*3/uL (ref 4.0–10.5)
nRBC: 0 % (ref 0.0–0.2)

## 2023-05-06 LAB — PLATELET FUNCTION ASSAY
Collagen / ADP: 76 s (ref 0–118)
Collagen / Epinephrine: 107 s (ref 0–193)

## 2023-05-06 LAB — APTT: aPTT: 27 s (ref 24–36)

## 2023-05-06 LAB — SEDIMENTATION RATE: Sed Rate: 11 mm/h (ref 0–22)

## 2023-05-06 LAB — PROTIME-INR
INR: 0.9 (ref 0.8–1.2)
Prothrombin Time: 12.4 s (ref 11.4–15.2)

## 2023-05-06 NOTE — Progress Notes (Signed)
 HEMATOLOGY/ONCOLOGY CONSULTATION NOTE  Date of Service: 05/06/2023  Patient Care Team: Donley Furth, MD as PCP - General Jacqueline Matsu, MD as PCP - Cardiology (Cardiology)  CHIEF COMPLAINTS/PURPOSE OF CONSULTATION:  Evaluation and management of ecchymotic areas of lower extremity  HISTORY OF PRESENTING ILLNESS:  Valerie Friedman is a wonderful 48 y.o. female who has been referred to us  by Dr. Alyne Babinski for evaluation and management of ecchymotic areas of lower extremity.   She is accompanied by her father during today's visit. Patient complains of easily brusing in her posterior lower extremities and hip. She reports having a scaly rash for at least one month. There did appear to be some scaly pigmentation changes near the left hip during examination.   She is unsure of how long she has had bruising in her lower extremities. Patient reports that the bruises do not resolve. Patient reports previously being told by a dermatologist that her symptoms were due to burns from a heating pad a couple of years ago that would not resolve. However, patient notes that that particular burn did resolve eventually.   Patient complains of bruising easily and notes falling on her knees plenty of times. She did injure her leg a couple of weeks ago with trauma from a trailer. However, she did notice having scabs on her posterior left leg and is unsure of the cause. She is not aware of any obvious trauma to this area of the lower extremity. Patient denies any insect bites. She does note that she has dogs.   She reports that she was diagnosed with psoriasis 6 months ago, though her symptoms were present prior to that time. She denies any related joint issues. Patient does not follow with a dermatologist at this time.   She uses 1 g of topical steroid cream once a week for her psoriasis. Patient denies any use of oral steroids or systemic treatment for her psoriasis.   Patient reports endorsing a facial rash  only in the eyebrow and in her left ear with scaliness and redness.  She notes that she does typically have dry skin.  She reports that Dr. Alyne Babinski is not completely convinced that her bruising is from burns. She reports that she was sent to use for further evaluation given that her lower extremity bruising has occurred more than once.   She reports hx of fracture in right foot. Patient reports a hx of foot surgery and notes that she did soak through her gauze the day of her surgery and needed to return to the office.   Patient denies any known fhx of bleeding disorders such as hemophilia, Von Willebrand disease, or other bleeding disorder.   Patient did also have previous endometriosis surgery with no concern for excessive bleeding.   She did have dental surgery as a child for removal of two teeth, as well as previous root canals, and there were no concerns for excessive bleeding.   She denies any fhx of bleeding disorder. Patient has no deeper bleeding suggestive of a primary bleeding disorder.  Patient reports a hx of irregular menstrual cycles. She has not had a period for two years until last week. Patient reports that when she did have periods, they would be very heavy and she could not leave the home for 2-3 days.   She notes an incident in which she cut her finger a couple months ago, and she did not bleed excessively at that time.   Patient takes OTC PreserVision  twice daily for vision care.   She has been taking aspirin  once daily since 4-5 days ago.   Patient reports that she frequently moves around and does not stay in one position for an extended period of time.   Patient reports hx of diabetes since 1.5-2 years ago. She has been on ozempic  for DM for almost 1 year. Over the year she has lost 30 pounds,10  of which since January.   Patient's Glipizide  was recently stopped. She reports that she was told to take Semaglutide  injections, though she is not taking it as she was unsure  if it was causing bruising.   She reports endorsing gum bleeds when brushing her teeth, which has been present since childhood. Patient denies any unexplained nose bleeds, GI bleeding, or internal bleeding.   Patient complains of easily vomiting sometimes, which is unexplained, and has been more bothersome over the last year. She notes that she would previously have vomiting episodes once a month, but more recently 2-3x a month. She denies feeling persistently excessively full.   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 While there may be some Capillary fragility as part of dystonia, she denies any excessive bruising prior to reaching her 75s in age.   Patient denies any excessive alcohol consumption. On average, she currently consumes 1-2 beers every couple of months. She denies any known liver disease.   She reports that she generally shaves her legs, though she has not recently.   She reports having a sore throat twice in January. Patient was found to have strep throat and took antibiotics. Patient reports having a slight cold after having a strep throat lasting 1 week.   Patient complains of severe allergies year-round, and especially at this time of year. She manages symptoms with Zyrtec.   Patient's eyes appear to be inflamed, which she attributes to allergies. She denies any Jaundice.   She reports that she was previously taking Centrum multivitamin once daily but discontinued as she did not feel it was beneficial.   She does endorse night sweats and hot flashes attributed to menopause.   She reports having infectious mononucleosis years ago and has had neck issues since.  She denies any change in bowel habits, leg swelling, unexplained fever, or chills.   In regards to fhx, her mother has psoriasis and arthritis. Her father has Rheumatoid arthritis and psoriasis.   Patient reports having Lumbar-thoracic arthritis and notes arthritis in the hands and feet.   She reports  abdominal discomfort.   MEDICAL HISTORY:  Past Medical History:  Diagnosis Date   Anemia    Anxiety    Arthritis    Cataracts, bilateral    MD just watching   Chronic lower back pain DUE TO MYOTONIC DYSTROPHY   Complication of anesthesia    slow to awaken , respirations either slow or fast.   Depression    patient denies this dx,  Therapist said that patient that she was fine after 2 years- ~2000   Endometriosis    Family history of adverse reaction to anesthesia 2022   brother was discharged after surgery had to be  taken back to hospital  CO2 was elevated, he was in the hospital for 9 days.   GERD (gastroesophageal reflux disease)    diet controlled   History of right foot drop    Migraine    Myotonic dystrophy (HCC) DX JULY 2009  ----- TYPE 1   FOLLOWED BY DR. Dania Dupre   Numbness  of fingers BILATERAL HANDS/  OCCASIONLLY RADIATES UP ARMS   Pelvic pain in female    Pneumonia    Seasonal allergies    Sleep apnea    does not use cpap   Vertigo    no current problems in the last year   Vitamin D  deficiency     SURGICAL HISTORY: Past Surgical History:  Procedure Laterality Date   BENIGN BREAST BX   FEB  2011   BREAST BIOPSY Right 2011   Benign Stereo    LAPAROSCOPY  05/01/2011   Procedure: LAPAROSCOPY DIAGNOSTIC;  Surgeon: Francia Ip, MD;  Location: [facility];  Service: Gynecology;  Laterality: N/A;  with bilateral chromopertubation, aspiration of functional left ovarian cyst, excision of two hydatid cysts   MUSCLE BIOPSY  02-25-2007   LEFT THIGH QUADRICEP BX X3  FOR MYOSITIS   OPEN REDUCTION INTERNAL FIXATION (ORIF) FOOT LISFRANC FRACTURE Left 09/05/2020   Procedure: OPEN REDUCTION INTERNAL FIXATION (ORIF) LEFT FOOT LISFRANC FRACTURE DISLOCATION;  Surgeon: Timothy Ford, MD;  Location: [facility] OR;  Service: Orthopedics;  Laterality: Left;   SURG. FOR REMOVAL OF TEETH FRAGMENTS    MARCH 2012   ORAL SURGEON OFFICE   WISDOM TOOTH EXTRACTION      SOCIAL  HISTORY: Social History   Socioeconomic History   Marital status: Single    Spouse name: Not on file   Number of children: Not on file   Years of education: Not on file   Highest education level: Not on file  Occupational History   Not on file  Tobacco Use   Smoking status: Former    Current packs/day: 0.00    Average packs/day: 0.3 packs/day for 10.0 years (2.5 ttl pk-yrs)    Types: Cigarettes    Start date: 06/29/2006    Quit date: 06/28/2016    Years since quitting: 6.8   Smokeless tobacco: Never   Tobacco comments:    varies, 0-0.5 pack per day  Vaping Use   Vaping status: Former   Devices: 1 year none since 2019  Substance and Sexual Activity   Alcohol use: Not Currently    Alcohol/week: 0.0 standard drinks of alcohol    Comment: rare   Drug use: No   Sexual activity: Not Currently    Birth control/protection: None  Other Topics Concern   Not on file  Social History Narrative   Pt lives in 1 story home with her father and brother   Some college   On disability   Social Drivers of Corporate investment banker Strain: Not on file  Food Insecurity: Not on file  Transportation Needs: Not on file  Physical Activity: Not on file  Stress: Not on file  Social Connections: Not on file  Intimate Partner Violence: Not At Risk (08/06/2022)   Received from [facility]    Abuse Screen    Feels Unsafe at Home or Work/School: no    Feels Threatened by Someone: no    Does Anyone Try to Keep You From Having Contact with Others or Doing Things Outside Your Home?: no    Physical Signs of Abuse Present: no    FAMILY HISTORY: Family History  Problem Relation Age of Onset   Diabetes Mother    Hypertension Father    Hyperlipidemia Father    Heart disease Father    Hypertension Brother    Diabetes Brother    Atrial fibrillation Brother    Heart disease Maternal Grandmother  Diabetes Maternal Grandfather    Heart disease Maternal Grandfather     Coronary artery disease Maternal Grandfather    Heart disease Paternal Grandmother    Stroke Paternal Grandmother    CAD Paternal Grandmother    Heart disease Paternal Grandfather    Cancer Paternal Uncle        Colon cancer   Parkinsonism Paternal Uncle    Coronary artery disease Maternal Uncle     ALLERGIES:  is allergic to latex, aleve [naproxen sodium], ciprocin-fluocin-procin [fluocinolone acetonide], macrobid [nitrofurantoin monohydrate macrocrystals], tramadol  hcl, trazodone and nefazodone, yellow dyes (non-tartrazine), and other.  MEDICATIONS:  Current Outpatient Medications  Medication Sig Dispense Refill   betamethasone  dipropionate 0.05 % cream Apply topically 2 (two) times daily. 45 g 3   cetirizine (ZYRTEC) 10 MG tablet Take 10 mg by mouth daily as needed for allergies.     glucose blood (ONETOUCH ULTRA) test strip 1 each by Other route 3 (three) times daily as needed for other. Use to test blood sugar 3 times daily as needed 100 strip 3   ketoconazole  (NIZORAL ) 2 % cream Apply 1 Application topically 2 (two) times daily as needed for irritation. 30 g 2   Semaglutide , 1 MG/DOSE, 4 MG/3ML SOPN Inject 1 mg as directed once a week. 3 mL 5   No current facility-administered medications for this visit.    REVIEW OF SYSTEMS:    10 Point review of Systems was done is negative except as noted above.  PHYSICAL EXAMINATION: ECOG PERFORMANCE STATUS: {CHL ONC ECOG ZO:[id number]}  .There were no vitals filed for this visit. There were no vitals filed for this visit. .There is no height or weight on file to calculate BMI.  GENERAL:alert, in no acute distress and comfortable SKIN: no acute rashes, no significant lesions EYES: conjunctiva are pink and non-injected, sclera anicteric OROPHARYNX: MMM, no exudates, no oropharyngeal erythema or ulceration NECK: supple, no JVD LYMPH:  no palpable lymphadenopathy in the cervical, axillary or inguinal regions LUNGS: clear to  auscultation b/l with normal respiratory effort HEART: regular rate & rhythm ABDOMEN:  normoactive bowel sounds , non tender, not distended. Extremity: no pedal edema PSYCH: alert & oriented x 3 with fluent speech NEURO: no focal motor/sensory deficits  LABORATORY DATA:  I have reviewed the data as listed  .    Latest Ref Rng & Units 04/20/2023    2:30 PM 01/28/2021    3:42 PM 09/05/2020    6:03 AM  CBC  WBC 4.0 - 10.5 K/uL 6.6  8.4  7.4   Hemoglobin 12.0 - 15.0 g/dL 81.1  91.4  78.2   Hematocrit 36.0 - 46.0 % 45.7  45.7  45.5   Platelets 150.0 - 400.0 K/uL 240.0  129.0  224     .    Latest Ref Rng & Units 04/20/2023    2:30 PM 01/28/2021    3:42 PM 06/22/2020   11:51 AM  CMP  Glucose 70 - 99 mg/dL 956  213  086   BUN 6 - 23 mg/dL 10  8  10    Creatinine 0.40 - 1.20 mg/dL 5.78  4.69  6.29   Sodium 135 - 145 mEq/L 143  140  138   Potassium 3.5 - 5.1 mEq/L 3.9  4.3  4.0   Chloride 96 - 112 mEq/L 107  104  102   CO2 19 - 32 mEq/L 28  26  25    Calcium  8.4 - 10.5 mg/dL 9.6  9.5  9.5  Total Protein 6.0 - 8.3 g/dL 7.3  7.4    Total Bilirubin 0.2 - 1.2 mg/dL 0.8  0.9    Alkaline Phos 39 - 117 U/L 58  76    AST 0 - 37 U/L 35  30    ALT 0 - 35 U/L 37  28       RADIOGRAPHIC STUDIES: I have personally reviewed the radiological images as listed and agreed with the findings in the report. No results found.  ASSESSMENT & PLAN:  48 y.o. female with:  ecchymotic areas of lower extremity Psoriasis   PLAN:  -CBC from 04/20/2023 showed WBC 6.6K, hgb 14.8, and platelets 240K -platelets normalized 240K at this time after previously being borderline at 129K -discussed that her scaly pigmentation changes near the left hip could potentially be a healed rash with post-inflammatory increased pigmentation or a healing bruise. Discussed that the area does not appear like a bruise at this time, but rather appears to be a superficial change in pigmentation in the skin -discussed that it is most  likely that skin/capillary changes from psoriasis are causing mild lacy/reticularis rash -discussed that it is likely that there is psoriasis causing other skin changes, and potentially Vasculitis  -discussed that the chance of a primary bleeding disorder is very low -discussed possibility of local injury, inflammatory processes from allergies, certain skin conditions, psoriasis, etc -recommend connecting with a dermatologist to confirm she has psoriasis.  -educated patient that there are several factors that could be causing superfiical skin changes including hormonal changes, menopause, injuries, blood vessel health, skin health, age, and inflammation in the superficial blood vessel -educated patient that inflammation in the skin can cause bruising -educated patient that if there is significant weight change, it does change the supporting tissues in superfical blood vessels -educated patient that trauma from furniture and pets are the most common causes of lower extremity bruising -educated patient that shaving her lower extremities can cause trauma to the area -educated patient that it is possible for there to be an area of pressure injury when sitting in a particular position for a while, especially in an uncomfortable position -educated patient that certain supplements/medications including fish oil and BC powder can cause bruising -she is unlikely to have an inherited platelet function defect, which would be very rare. Discussed that most of those cases are generally very severe and are often diagnosed in childhood which is not the case.  -discussed that with recurrent reticular dermatitis there would typically be more systemic events -educated patient that in rare cases, an inherited clotting diseases and liver disease can lead to clotting factor deficiencies -Prothrombin time and aPTT testing from 5-6 years ago showed normal baseline -educated patient that aspirin  and NSAIDS will affect  platelet function, though tylenol  would not -educated patient that aspirin  can cause bruising and she may discuss this with her PCP  -educated patient that viral infections can potentially trigger unusual reactions -educated patient that infectious mononucleosis can cause enlarged lymph nodes and enlarged spleen -would generally not recommend consuming unpasturized milk due to increased risk of contaminants -educated patient that with some surgeries, use of certain types of Thrombin gel or wound glues can trigger antibody formation -educated patient that Ozempic  can cause gastroparesis to some degree -educated patient that Zyrtec can dry the skin -if there is concern for a post-inflammatory process, I would recommend mixing a moisturizer such as Cetaphil with a quart-sized amount of hydrocortisone and applying to the area -discussed that we would generally  not recommend Fluorinated steroid cream such as Betamethasone  dipropionate on the face unless it is absolutely necessary as it can cause thin skin. Discussed that fluorinated steroids are generally used on the body.  -educated patient that chornic steroid use will thin the skin -recommend to keep very well-moisturized to improve the skin  -discussed options of using other soothing agents such as OTC Selenium sulfide shampoos and soaps  -educated patient that an unbalanced diet can effect the integrity of the skin -recommend maintaining adequate protein intake and vitamin C in the diet for skin and capillary integrity -will order labs today to ensure there is no primary bleeding disorder  -answered all of patient's questions in detail  FOLLOW-UP: Labs today Phone visit with Dr Salomon Cree in 2 weeks  The total time spent in the appointment was *** minutes* .  All of the patient's questions were answered with apparent satisfaction. The patient knows to call the clinic with any problems, questions or concerns.   Jacquelyn Matt MD MS AAHIVMS [facility]  [facility] Hematology/Oncology Physician [facility]  .*Total Encounter Time as defined by the Centers for Medicare and Medicaid Services includes, in addition to the face-to-face time of a patient visit (documented in the note above) non-face-to-face time: obtaining and reviewing outside history, ordering and reviewing medications, tests or procedures, care coordination (communications with other health care professionals or caregivers) and documentation in the medical record.    I,Mitra Faeizi,acting as a Neurosurgeon for Jacquelyn Matt, MD.,have documented all relevant documentation on the behalf of Jacquelyn Matt, MD,as directed by  Jacquelyn Matt, MD while in the presence of Jacquelyn Matt, MD.  ***

## 2023-05-07 LAB — RHEUMATOID FACTOR: Rheumatoid fact SerPl-aCnc: <10 [IU]/mL (ref <14.0)

## 2023-05-08 LAB — COAG STUDIES INTERP REPORT

## 2023-05-08 LAB — VON WILLEBRAND PANEL
Coagulation Factor VIII: 197 % — ABNORMAL HIGH (ref 56–140)
Ristocetin Co-factor, Plasma: 144 % (ref 50–200)
Von Willebrand Antigen, Plasma: 160 % (ref 50–200)

## 2023-05-08 LAB — COLD AGGLUTININ TITER: Cold Agglutinin Titer: NEGATIVE

## 2023-05-12 LAB — FANA STAINING PATTERNS: Speckled Pattern: 1:160 {titer} — ABNORMAL HIGH

## 2023-05-12 LAB — CRYOGLOBULIN

## 2023-05-12 LAB — ANTINUCLEAR ANTIBODIES, IFA: ANA Ab, IFA: POSITIVE — AB

## 2023-05-19 NOTE — Progress Notes (Signed)
 HEMATOLOGY/ONCOLOGY PHONE VISIT NOTE  Date of Service: 05/20/2023  Patient Care Team: Donley Furth, MD as PCP - General Jacqueline Matsu, MD as PCP - Cardiology (Cardiology)  CHIEF COMPLAINTS/PURPOSE OF CONSULTATION:  Evaluation and management of ecchymotic areas of lower extremity  HISTORY OF PRESENTING ILLNESS:   Valerie Friedman is a wonderful 48 y.o. female who has been referred to us  by Dr. Alyne Babinski for evaluation and management of ecchymotic areas of lower extremity.   She is accompanied by her father during today's visit. Patient complains of easily brusing in her posterior lower extremities and hip. She reports having a scaly rash for at least one month. There did appear to be some scaly pigmentation changes near the left hip during examination.   She is unsure of how long she has had bruising in her lower extremities. Patient reports that the bruises do not resolve. Patient reports previously being told by a dermatologist that her symptoms were due to burns from a heating pad a couple of years ago that would not resolve. However, patient notes that that particular burn did resolve eventually.   Patient complains of bruising easily and notes falling on her knees plenty of times. She did injure her leg a couple of weeks ago with trauma from a trailer. However, she did notice having scabs on her posterior left leg and is unsure of the cause. She is not aware of any obvious trauma to this area of the lower extremity. Patient denies any insect bites. She does note that she has dogs.   She reports that she was diagnosed with psoriasis 6 months ago, though her symptoms were present prior to that time. She denies any related joint issues. Patient does not follow with a dermatologist at this time.   She uses 1 g of topical steroid cream once a week for her psoriasis. Patient denies any use of oral steroids or systemic treatment for her psoriasis.   Patient reports endorsing a facial rash  only in the eyebrow and in her left ear with scaliness and redness.  She notes that she does typically have dry skin.  She reports that Dr. Alyne Babinski is not completely convinced that her bruising is from burns. She reports that she was sent to use for further evaluation given that her lower extremity bruising has occurred more than once.   She reports hx of fracture in right foot. Patient reports a hx of foot surgery and notes that she did soak through her gauze the day of her surgery and needed to return to the office.   Patient denies any known fhx of bleeding disorders such as hemophilia, Von Willebrand disease, or other bleeding disorder.   Patient did also have previous endometriosis surgery with no concern for excessive bleeding.   She did have dental surgery as a child for removal of two teeth, as well as previous root canals, and there were no concerns for excessive bleeding.   She denies any fhx of bleeding disorder. Patient has no deeper bleeding suggestive of a primary bleeding disorder.  Patient reports a hx of irregular menstrual cycles. She has not had a period for two years until last week. Patient reports that when she did have periods, they would be very heavy and she could not leave the home for 2-3 days.   She notes an incident in which she cut her finger a couple months ago, and she did not bleed excessively at that time.   Patient takes  OTC PreserVision twice daily for vision care.   She has been taking aspirin  once daily since 4-5 days ago.   Patient reports that she frequently moves around and does not stay in one position for an extended period of time.   Patient reports hx of diabetes since 1.5-2 years ago. She has been on ozempic  for DM for almost 1 year. Over the year she has lost 30 pounds,10  of which since January.   Patient's Glipizide  was recently stopped. She reports that she was told to take Semaglutide  injections, though she is not taking it as she was unsure  if it was causing bruising.   She reports endorsing gum bleeds when brushing her teeth, which has been present since childhood. Patient denies any unexplained nose bleeds, GI bleeding, or internal bleeding.   Patient complains of easily vomiting sometimes, which is unexplained, and has been more bothersome over the last year. She notes that she would previously have vomiting episodes once a month, but more recently 2-3x a month. She denies feeling persistently excessively full.   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 While there may be some Capillary fragility as part of dystonia, she denies any excessive bruising prior to reaching her 38s in age.   Patient denies any excessive alcohol consumption. On average, she currently consumes 1-2 beers every couple of months. She denies any known liver disease.   She reports that she generally shaves her legs, though she has not recently.   She reports having a sore throat twice in January. Patient was found to have strep throat and took antibiotics. Patient reports having a slight cold after having a strep throat lasting 1 week.   Patient complains of severe allergies year-round, and especially at this time of year. She manages symptoms with Zyrtec.   Patient's eyes appear to be inflamed, which she attributes to allergies. She denies any Jaundice.   She reports that she was previously taking Centrum multivitamin once daily but discontinued as she did not feel it was beneficial.   She does endorse night sweats and hot flashes attributed to menopause.   She reports having infectious mononucleosis years ago and has had neck issues since.  She denies any change in bowel habits, leg swelling, unexplained fever, or chills.   In regards to fhx, her mother has psoriasis and arthritis. Her father has Rheumatoid arthritis and psoriasis.   Patient reports having Lumbar-thoracic arthritis and notes arthritis in the hands and feet.   She reports  abdominal discomfort.   INTERVAL HISTORY:  Valerie Friedman is a 48 y.o. female who is being connected with via telemedicine visit for continued evaluation and management of   She was initially seen by me on 05/06/2023 and complained of easily brusing in her posterior lower extremities and hip, scaly rash, facial rash in eyebrow and left ear, dry skin, irregular menstrual cycles, gum bleeding when brushing teeth since childhood, easily unexplained vomiting sometimes, severe seasonal allergies, eye inflammation, night sweats and hot flashes attributed to menopause, arthritis in hands and feet, and abdominal discomfort.   I connected with Parris Bolognese on 05/20/2023 at  3:30 PM EDT by telephone visit and verified that I am speaking with the correct person using two identifiers.   I discussed the limitations, risks, security and privacy concerns of performing an evaluation and management service by telemedicine and the availability of in-person appointments. I also discussed with the patient that there may be a patient responsible charge  related to this service. The patient expressed understanding and agreed to proceed.   Other persons participating in the visit and their role in the encounter: none   Patient's location: home  Provider's location: [facility] [place]   Chief Complaint:  ecchymotic areas of lower extremity     Today, The results of her recent lab workup were discussed with her in detail.  MEDICAL HISTORY:  Past Medical History:  Diagnosis Date   Anemia    Anxiety    Arthritis    Cataracts, bilateral    MD just watching   Chronic lower back pain DUE TO MYOTONIC DYSTROPHY   Complication of anesthesia    slow to awaken , respirations either slow or fast.   Depression    patient denies this dx,  Therapist said that patient that she was fine after 2 years- ~2000   Endometriosis    Family history of adverse reaction to anesthesia 2022   brother was discharged after surgery had to be   taken back to hospital  CO2 was elevated, he was in the hospital for 9 days.   GERD (gastroesophageal reflux disease)    diet controlled   History of right foot drop    Migraine    Myotonic dystrophy (HCC) DX JULY 2009  ----- TYPE 1   FOLLOWED BY DR. LOVE   Numbness of fingers BILATERAL HANDS/  OCCASIONLLY RADIATES UP ARMS   Pelvic pain in female    Pneumonia    Seasonal allergies    Sleep apnea    does not use cpap   Vertigo    no current problems in the last year   Vitamin D  deficiency     SURGICAL HISTORY: Past Surgical History:  Procedure Laterality Date   BENIGN BREAST BX   FEB  2011   BREAST BIOPSY Right 2011   Benign Stereo    LAPAROSCOPY  05/01/2011   Procedure: LAPAROSCOPY DIAGNOSTIC;  Surgeon: Francia Ip, MD;  [facility];  Service: Gynecology;  Laterality: N/A;  with bilateral chromopertubation, aspiration of functional left ovarian cyst, excision of two hydatid cysts   MUSCLE BIOPSY  02-25-2007   LEFT THIGH QUADRICEP BX X3  FOR MYOSITIS   OPEN REDUCTION INTERNAL FIXATION (ORIF) FOOT LISFRANC FRACTURE Left 09/05/2020   Procedure: OPEN REDUCTION INTERNAL FIXATION (ORIF) LEFT FOOT LISFRANC FRACTURE DISLOCATION;  Surgeon: Timothy Ford, MD;  Location: [facility] OR;  Service: Orthopedics;  Laterality: Left;   SURG. FOR REMOVAL OF TEETH FRAGMENTS    MARCH 2012   ORAL SURGEON OFFICE   WISDOM TOOTH EXTRACTION      SOCIAL HISTORY: Social History   Socioeconomic History   Marital status: Single    Spouse name: Not on file   Number of children: Not on file   Years of education: Not on file   Highest education level: Not on file  Occupational History   Not on file  Tobacco Use   Smoking status: Former    Current packs/day: 0.00    Average packs/day: 0.3 packs/day for 10.0 years (2.5 ttl pk-yrs)    Types: Cigarettes    Start date: 06/29/2006    Quit date: 06/28/2016    Years since quitting: 6.8   Smokeless tobacco: Never   Tobacco comments:     varies, 0-0.5 pack per day  Vaping Use   Vaping status: Former   Devices: 1 year none since 2019  Substance and Sexual Activity   Alcohol use: Not Currently  Alcohol/week: 0.0 standard drinks of alcohol    Comment: rare   Drug use: No   Sexual activity: Not Currently    Birth control/protection: None  Other Topics Concern   Not on file  Social History Narrative   Pt lives in 1 story home with her father and brother   Some college   On disability   Social Drivers of Corporate investment banker Strain: Not on file  Food Insecurity: Not on file  Transportation Needs: Not on file  Physical Activity: Not on file  Stress: Not on file  Social Connections: Not on file  Intimate Partner Violence: Not At Risk (08/06/2022)   Received from [facility]    Abuse Screen    Feels Unsafe at Home or Work/School: no    Feels Threatened by Someone: no    Does Anyone Try to Keep You From Having Contact with Others or Doing Things Outside Your Home?: no    Physical Signs of Abuse Present: no    FAMILY HISTORY: Family History  Problem Relation Age of Onset   Diabetes Mother    Hypertension Father    Hyperlipidemia Father    Heart disease Father    Hypertension Brother    Diabetes Brother    Atrial fibrillation Brother    Heart disease Maternal Grandmother    Diabetes Maternal Grandfather    Heart disease Maternal Grandfather    Coronary artery disease Maternal Grandfather    Heart disease Paternal Grandmother    Stroke Paternal Grandmother    CAD Paternal Grandmother    Heart disease Paternal Grandfather    Cancer Paternal Uncle        Colon cancer   Parkinsonism Paternal Uncle    Coronary artery disease Maternal Uncle     ALLERGIES:  is allergic to latex, aleve [naproxen sodium], ciprocin-fluocin-procin [fluocinolone acetonide], macrobid [nitrofurantoin monohydrate macrocrystals], tramadol  hcl, trazodone and nefazodone, yellow dyes (non-tartrazine),  and other.  MEDICATIONS:  Current Outpatient Medications  Medication Sig Dispense Refill   betamethasone  dipropionate 0.05 % cream Apply topically 2 (two) times daily. 45 g 3   cetirizine (ZYRTEC) 10 MG tablet Take 10 mg by mouth daily as needed for allergies.     glucose blood (ONETOUCH ULTRA) test strip 1 each by Other route 3 (three) times daily as needed for other. Use to test blood sugar 3 times daily as needed 100 strip 3   ketoconazole  (NIZORAL ) 2 % cream Apply 1 Application topically 2 (two) times daily as needed for irritation. 30 g 2   Semaglutide , 1 MG/DOSE, 4 MG/3ML SOPN Inject 1 mg as directed once a week. 3 mL 5   No current facility-administered medications for this visit.    REVIEW OF SYSTEMS:    10 Point review of Systems was done is negative except as noted above.   PHYSICAL EXAMINATION: TELEMEDICINE VISIT   LABORATORY DATA:  I have reviewed the data as listed  .    Latest Ref Rng & Units 05/06/2023    4:10 PM 04/20/2023    2:30 PM 01/28/2021    3:42 PM  CBC  WBC 4.0 - 10.5 K/uL 8.5  6.6  8.4   Hemoglobin 12.0 - 15.0 g/dL 81.1  91.4  78.2   Hematocrit 36.0 - 46.0 % 46.9  45.7  45.7   Platelets 150 - 400 K/uL 212  240.0  129.0     .    Latest Ref Rng & Units 04/20/2023  2:30 PM 01/28/2021    3:42 PM 06/22/2020   11:51 AM  CMP  Glucose 70 - 99 mg/dL 188  416  606   BUN 6 - 23 mg/dL 10  8  10    Creatinine 0.40 - 1.20 mg/dL 3.01  6.01  0.93   Sodium 135 - 145 mEq/L 143  140  138   Potassium 3.5 - 5.1 mEq/L 3.9  4.3  4.0   Chloride 96 - 112 mEq/L 107  104  102   CO2 19 - 32 mEq/L 28  26  25    Calcium  8.4 - 10.5 mg/dL 9.6  9.5  9.5   Total Protein 6.0 - 8.3 g/dL 7.3  7.4    Total Bilirubin 0.2 - 1.2 mg/dL 0.8  0.9    Alkaline Phos 39 - 117 U/L 58  76    AST 0 - 37 U/L 35  30    ALT 0 - 35 U/L 37  28     Component     Latest Ref Rng 05/06/2023  Collagen / ADP     0 - 118 seconds 76   PFA Interpretation --   Collagen / Epinephrine     0 - 193 seconds  107   Coagulation Factor VIII     56 - 140 % 197 (H)   Ristocetin Co-factor, Plasma     50 - 200 % 144   Von Willebrand Antigen, Plasma     50 - 200 % 160   Prothrombin Time     11.4 - 15.2 seconds 12.4   INR     0.8 - 1.2  0.9   APTT     24 - 36 seconds 27     Legend: (H) High  RADIOGRAPHIC STUDIES: I have personally reviewed the radiological images as listed and agreed with the findings in the report. No results found.  ASSESSMENT & PLAN:  48 y.o. female with:  Ecchymotic areas of lower extremity-- r/o bleeding disorder. Livedo reticularis Psoriasis   PLAN:  -Discussed lab results from 05/06/2023 in detail with patient. CBC showed WBC of 8.5K, hemoglobin of 15.2, and platelets of 212K. -labs show no evidence of bleeding diathesis with normal PLT count, nl plt function testing, vwp, PT and APTT. -cold agglutinins and cyro globulin neg ANA+ -- possible autoimmune condition -- consider referral to rheumatology  FOLLOW-UP: RTC with PCP  The total time spent in the appointment was 20 minutes* .  All of the patient's questions were answered with apparent satisfaction. The patient knows to call the clinic with any problems, questions or concerns.   Jacquelyn Matt MD MS AAHIVMS [facility] Hematology/Oncology Physician [facility]  .*Total Encounter Time as defined by the Centers for Medicare and Medicaid Services includes, in addition to the face-to-face time of a patient visit (documented in the note above) non-face-to-face time: obtaining and reviewing outside history, ordering and reviewing medications, tests or procedures, care coordination (communications with other health care professionals or caregivers) and documentation in the medical record.    I,Mitra Faeizi,acting as a Neurosurgeon for Jacquelyn Matt, MD.,have documented all relevant documentation on the behalf of Jacquelyn Matt, MD,as directed by  Jacquelyn Matt, MD while in the presence of Jacquelyn Matt, MD.  .I have  reviewed the above documentation for accuracy and completeness, and I agree with the above. .Kelin Nixon Kishore Macintyre Alexa MD

## 2023-05-20 ENCOUNTER — Inpatient Hospital Stay: Attending: Hematology | Admitting: Hematology

## 2023-05-20 DIAGNOSIS — R233 Spontaneous ecchymoses: Secondary | ICD-10-CM | POA: Insufficient documentation

## 2023-05-20 DIAGNOSIS — Z87891 Personal history of nicotine dependence: Secondary | ICD-10-CM | POA: Insufficient documentation

## 2023-05-20 DIAGNOSIS — L409 Psoriasis, unspecified: Secondary | ICD-10-CM | POA: Diagnosis not present

## 2023-05-20 DIAGNOSIS — R231 Pallor: Secondary | ICD-10-CM | POA: Insufficient documentation

## 2023-05-22 ENCOUNTER — Encounter: Admitting: Hematology

## 2023-05-22 ENCOUNTER — Other Ambulatory Visit

## 2023-05-22 NOTE — Progress Notes (Signed)
   05/22/2023  Patient ID: Valerie Friedman, female   DOB: 1975-03-04, 48 y.o.   MRN: [id number]  Pharmacy Quality Measure Review  This patient is appearing on a report for being at risk of failing the adherence measure for diabetes medications this calendar year.   Medication: Ozempic  and Glipizide   Glipizide  stopped per chart review.  Ozempic  Last fill date: 05/05/23 for 28 day supply  Insurance report was not up to date. No action needed at this time.   Carnell Christian, PharmD Clinical Pharmacist [phone]
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
## 2023-05-29 ENCOUNTER — Encounter: Payer: Self-pay | Admitting: Pediatrics

## 2023-06-03 ENCOUNTER — Ambulatory Visit (AMBULATORY_SURGERY_CENTER): Admitting: *Deleted

## 2023-06-03 VITALS — Ht 63.0 in | Wt 137.0 lb

## 2023-06-03 DIAGNOSIS — Z1211 Encounter for screening for malignant neoplasm of colon: Secondary | ICD-10-CM

## 2023-06-03 MED ORDER — ONDANSETRON HCL 4 MG PO TABS: 4.0000 mg | ORAL_TABLET | Freq: Three times a day (TID) | ORAL | 0 refills | Status: DC | PRN

## 2023-06-03 MED ORDER — NA SULFATE-K SULFATE-MG SULF 17.5-3.13-1.6 GM/177ML PO SOLN
1.0000 | Freq: Once | ORAL | 0 refills | Status: AC
Start: 2023-06-03 — End: 2023-06-03

## 2023-06-03 NOTE — Progress Notes (Signed)
  Pre visit completed over telephone. Instructions mailed and forwarded through MyChart. Zofran  sent with prep due to frequent nausea.  No egg or soy allergy known to patient  No issues known to pt with past sedation with any surgeries or procedures Patient denies ever being told they had issues or difficulty with intubation  No FH of Malignant Hyperthermia Pt is not on diet pills Pt is not on  home 02  Pt is not on blood thinners  Pt denies issues with constipation  No A fib or A flutter Have any cardiac testing pending--NO  Pt instructed to use Singlecare.com or GoodRx for a price reduction on prep

## 2023-06-09 DIAGNOSIS — L308 Other specified dermatitis: Secondary | ICD-10-CM | POA: Diagnosis not present

## 2023-06-09 DIAGNOSIS — L986 Other infiltrative disorders of the skin and subcutaneous tissue: Secondary | ICD-10-CM | POA: Diagnosis not present

## 2023-06-11 ENCOUNTER — Encounter: Payer: Self-pay | Admitting: Family Medicine

## 2023-06-11 ENCOUNTER — Ambulatory Visit (INDEPENDENT_AMBULATORY_CARE_PROVIDER_SITE_OTHER): Admitting: Family Medicine

## 2023-06-11 VITALS — BP 110/70 | HR 71 | Temp 98.0°F | Wt 137.6 lb

## 2023-06-11 DIAGNOSIS — Z7985 Long-term (current) use of injectable non-insulin antidiabetic drugs: Secondary | ICD-10-CM | POA: Diagnosis not present

## 2023-06-11 DIAGNOSIS — R21 Rash and other nonspecific skin eruption: Secondary | ICD-10-CM

## 2023-06-11 DIAGNOSIS — E1165 Type 2 diabetes mellitus with hyperglycemia: Secondary | ICD-10-CM

## 2023-06-11 NOTE — Progress Notes (Signed)
   Subjective:    Patient ID: Valerie Friedman, female    DOB: May 05, 1975, 48 y.o.   MRN: [id number]  HPI Here to follow up on skin discolorations on the backs of her legs and on diabetes. We referred her to Dr. Salomon Cree for a Hematology evaluation, and he determined that she does not have a bleeding disorder. Her labs were all normal except for a mildly elevated ANA of 1:160 with a speckled nuclear pattern. She then saw her Dermatologist, Dr. Del Favia, and he did two skin biopsies. These reports are still pending. He suggested we check anticardiolipin antibodies. As for her diabetes, at our last visit we stopped her Glipizide  and continued her on Semaglutide  shots. Her A1c that day was 6.2%. She wants to check an A1c today to see how she is doing. She feels well in general. Her A1c today is 6.4%.   Review of Systems  Constitutional: Negative.   Respiratory: Negative.    Cardiovascular: Negative.   Skin:  Positive for color change.       Objective:   Physical Exam Constitutional:      Appearance: Normal appearance.  Cardiovascular:     Rate and Rhythm: Normal rate and regular rhythm.     Pulses: Normal pulses.     Heart sounds: Normal heart sounds.  Pulmonary:     Effort: Pulmonary effort is normal.     Breath sounds: Normal breath sounds.  Neurological:     Mental Status: She is alert.           Assessment & Plan:  Her type 2 diabetes is doing well on Semaglutide , so we will continue with this regimen. For the skin lesions, we will check anticardiolipin antibody levels today.  Corita Diego, MD

## 2023-06-12 NOTE — Progress Notes (Signed)
   06/12/2023  Patient ID: Valerie Friedman, female   DOB: Mar 12, 1975, 48 y.o.   MRN: [id number]  Pharmacy Quality Measure Review  This patient is appearing on a report for being at risk of failing the adherence measure for diabetes medications this calendar year.   Medication: Ozempic  Last fill date: 05/27/23 for 28 day supply  Insurance report was not up to date. No action needed at this time.   Carnell Christian, PharmD Clinical Pharmacist [phone]

## 2023-06-13 LAB — CARDIOLIPIN ANTIBODIES, IGG, IGM, IGA
Anticardiolipin IgA: <9 U/mL (ref 0–11)
Anticardiolipin IgG: <9 GPL U/mL (ref 0–14)
Anticardiolipin IgM: 11 [MPL'U]/mL (ref 0–12)

## 2023-06-15 ENCOUNTER — Other Ambulatory Visit: Payer: Self-pay | Admitting: Family Medicine

## 2023-06-15 ENCOUNTER — Ambulatory Visit: Payer: Self-pay | Admitting: Family Medicine

## 2023-06-19 ENCOUNTER — Encounter: Payer: Self-pay | Admitting: Family Medicine

## 2023-06-19 ENCOUNTER — Ambulatory Visit (INDEPENDENT_AMBULATORY_CARE_PROVIDER_SITE_OTHER): Admitting: Family Medicine

## 2023-06-19 VITALS — BP 124/78 | HR 86 | Temp 98.3°F | Wt 135.4 lb

## 2023-06-19 DIAGNOSIS — M2550 Pain in unspecified joint: Secondary | ICD-10-CM

## 2023-06-19 DIAGNOSIS — L989 Disorder of the skin and subcutaneous tissue, unspecified: Secondary | ICD-10-CM | POA: Diagnosis not present

## 2023-06-19 DIAGNOSIS — R1111 Vomiting without nausea: Secondary | ICD-10-CM | POA: Diagnosis not present

## 2023-06-19 MED ORDER — SEMAGLUTIDE (2 MG/DOSE) 8 MG/3ML ~~LOC~~ SOPN: 2.0000 mg | PEN_INJECTOR | SUBCUTANEOUS | 5 refills | Status: DC

## 2023-06-19 NOTE — Patient Instructions (Signed)
u

## 2023-06-19 NOTE — Progress Notes (Signed)
   Subjective:    Patient ID: Valerie Friedman, female    DOB: 1975/09/08, 48 y.o.   MRN: [id number]  HPI Here with her father to discuss several issues. First she has been seeing Dr. Del Favia for dermatology care, but she wants to change to a new group. Second her rheumatologist has retired, and she wants a referral for a new one. Third she has had a problem with vomiting for years, but over the past few months this has become much more common. No trouble swallowing. Her BM's are regular. She is scheduled to have a colonoscopy with Dr. Yvone Herd on 06-23-23.    Review of Systems  Constitutional: Negative.   Respiratory: Negative.    Cardiovascular: Negative.   Gastrointestinal:  Positive for nausea and vomiting. Negative for abdominal distention, abdominal pain, blood in stool, constipation and diarrhea.  Musculoskeletal:  Positive for arthralgias.       Objective:   Physical Exam Constitutional:      Appearance: Normal appearance.  Cardiovascular:     Rate and Rhythm: Normal rate and regular rhythm.     Pulses: Normal pulses.     Heart sounds: Normal heart sounds.  Pulmonary:     Effort: Pulmonary effort is normal.     Breath sounds: Normal breath sounds.  Neurological:     Mental Status: She is alert.           Assessment & Plan:  For the frequent vomiting, we will refer her to Dr. Yvone Herd for a GO evaluation. I advised her to postpone the colonoscopy for now because there is a good chance Dr. Yvone Herd may want to do both upper and lower endoscopies. Second we will refer her to [facility] Rheumatology for the joint pains. Third we will refer her to [facility] Dermatology for her skin lesions.  Valerie Diego, MD

## 2023-06-22 ENCOUNTER — Telehealth: Payer: Self-pay

## 2023-06-22 NOTE — Telephone Encounter (Signed)
 Copied from CRM [id number]. Topic: Clinical - Lab/Test Results >> Jun 22, 2023  2:00 PM Baldo Levan wrote: Reason for CRM: Patient is requesting her lab work be faxed to Dr. Denman Fischer at fax number [phone], as soon as possible preferably this afternoon, as the patient sees him this week and he needs to review them before the appointment. Please contact the patient at [phone] with any questions.

## 2023-06-23 ENCOUNTER — Encounter: Admitting: Pediatrics

## 2023-06-23 NOTE — Telephone Encounter (Signed)
 Pt labs faxed to Dr Del Favia as per pt request, confirmation received

## 2023-06-26 DIAGNOSIS — R231 Pallor: Secondary | ICD-10-CM | POA: Diagnosis not present

## 2023-06-26 DIAGNOSIS — L218 Other seborrheic dermatitis: Secondary | ICD-10-CM | POA: Diagnosis not present

## 2023-07-03 NOTE — Progress Notes (Signed)
   07/03/2023  Patient ID: Valerie Friedman, female   DOB: February 14, 1975, 48 y.o.   MRN: [id number]  Pharmacy Quality Measure Review  This patient is appearing on a report for being at risk of failing the adherence measure for diabetes medications this calendar year.   Medication: Ozempic  Last fill date: 06/22/23 for 28 day supply  Insurance report was not up to date. No action needed at this time.   Carnell Christian, PharmD Clinical Pharmacist [phone]

## 2023-08-11 ENCOUNTER — Ambulatory Visit (INDEPENDENT_AMBULATORY_CARE_PROVIDER_SITE_OTHER): Admitting: Family Medicine

## 2023-08-11 ENCOUNTER — Encounter: Payer: Self-pay | Admitting: Family Medicine

## 2023-08-11 VITALS — BP 120/82 | HR 82 | Temp 97.9°F | Wt 131.4 lb

## 2023-08-11 DIAGNOSIS — E1165 Type 2 diabetes mellitus with hyperglycemia: Secondary | ICD-10-CM

## 2023-08-11 DIAGNOSIS — R2232 Localized swelling, mass and lump, left upper limb: Secondary | ICD-10-CM

## 2023-08-11 LAB — POCT GLYCOSYLATED HEMOGLOBIN (HGB A1C): Hemoglobin A1C: 6.1 % — AB (ref 4.0–5.6)

## 2023-08-11 NOTE — Progress Notes (Signed)
   Subjective:    Patient ID: Valerie Friedman, female    DOB: March 22, 1975, 48 y.o.   MRN: [id number]  HPI Here to follow up on diabetes and to discuss a painful knot that appeared in her left palm about a month ago. She feels well in general. Her A1c today is 6.1%.    Review of Systems  Constitutional: Negative.   Respiratory: Negative.    Cardiovascular: Negative.   Musculoskeletal:  Positive for arthralgias.       Objective:   Physical Exam Constitutional:      Appearance: Normal appearance.  Cardiovascular:     Rate and Rhythm: Normal rate and regular rhythm.     Pulses: Normal pulses.     Heart sounds: Normal heart sounds.  Pulmonary:     Effort: Pulmonary effort is normal.     Breath sounds: Normal breath sounds.  Musculoskeletal:     Comments: The left palm has a tender firm nodule on the 4th flexor tendon  Neurological:     Mental Status: She is alert.           Assessment & Plan:  Her diabetes is well controlled. She also has a palmar nodule, so we will refer her to Hand Surgery for this.  Garnette Olmsted, MD

## 2023-08-18 ENCOUNTER — Telehealth: Payer: Self-pay

## 2023-08-18 NOTE — Telephone Encounter (Signed)
 Pt Glucose meter was called in to pt pharmacy, pt will be notified when ready as per the pharmacy tech.  Copied from CRM [id number]. Topic: Clinical - Medication Question >> Aug 14, 2023  2:49 PM Burnard DEL wrote: Reason for CRM: Patient called in stating that provider was suppose to have put a new blood glucose meter on the hand written prescription that was given to her at her appointment on 08/11/2023. Patient stated that her and her brother ([birth year]76 Vicenta A,Weigold) were suppose to get orders for new glucose meters.  [id number] [place], [place] - [address] AT [facility] [address] [facility] [address] Phone: [phone] Fax: [phone]

## 2023-08-19 ENCOUNTER — Telehealth: Payer: Self-pay

## 2023-08-19 NOTE — Telephone Encounter (Signed)
 Spoke with pt confirmed that she picked up Rx from pharmacy Copied from CRM [id number]. Topic: General - Call Back - No Documentation >> Aug 18, 2023  5:33 PM Chiquita SQUIBB wrote: Reason for CRM: Patient is returning a call back from a missed call, nothing noted at this time for a call to the patient. Please call the patient back if it is urgent, patient stated that her glucose monitor was an error with the pharmacy and they fixed it.

## 2023-09-01 ENCOUNTER — Ambulatory Visit: Admitting: Gastroenterology

## 2023-09-22 NOTE — Progress Notes (Addendum)
 [facility] Initial Consultation   Referring Provider Johnny Garnette LABOR, MD [address] [place],  [place] [address]  Primary Care Provider Johnny Garnette LABOR, MD  Patient Profile: Valerie Friedman is a 48 y.o. female who is seen in consultation in the [facility] at the request of Dr. Johnny for evaluation and management of the problem(s) noted below.  Problem List: Persistent vomiting Left lower quadrant abdominal pain Elevated lipase Weight loss Colorectal cancer screening   History of Present Illness   [name] is a 48 y.o. female with a history of migraine, OSA, T2DM, psoriasis, myotonic dystrophy, fibromyalgia  Discussed the use of AI scribe software for clinical note transcription with the patient, who gave verbal consent to proceed.  History of Present Illness [name] is a 48 year old female with a history of migraines, OSA, T2DM, psoriasis, myotonic dystrophy and fibromyalgia who presents with chronic abdominal pain and vomiting. She is accompanied by her father.  Chronic abdominal pain and vomiting - Reports a history of intermittent chronic vomiting for many years - States that initially symptoms were sporadic and she did not think very much of it - Over the last year she notes progressive and crescendoing episodes of vomiting now occurring 2-3 times a week, primarily after dinner - Denies feeling nauseated - vomiting seems to come out of the blue - Endorses associated left lower quadrant abdominal  - Pain is shooting in character, occurs before vomiting, and radiates; pain ceases after vomiting - No specific food triggers identified - Vomiting occurs relatively quickly after eating can occur at any point during a meal - Often feels hungry after vomiting  - Denies focal neurologic deficits - Denies smoking, cannabis use or alcohol - No documented history of gastroparesis  - Has not had laboratory studies or radiographic  evaluation to date  Gastrointestinal function - Bowel movements occur once or twice daily - Stool is normal in appearance - No blood or mucus in stool  Systemic and constitutional symptoms - No fevers or chills - No significant changes in skin, hair, or nails - Associated with unintentional weight loss ~ 10 pounds over last year  Diabetes mellitus and medication history - Diabetes mellitus present for three years - Ozempic  used for approximately one year - Discontinuation of Ozempic  for one month did not alter vomiting frequency - Weight loss attributed to diabetes management and medication  Colorectal cancer screening - Due for colorectal cancer screening - Paternal uncle diagnosed with colorectal cancer in his 88s - Father has had colon polyps  Neuromuscular disease and anesthesia complications - History of myotonic dystrophy - Past complications with anesthesia -states it has been difficult for her to wake up with general anesthesia - Denies respiratory issues - She expresses concern regarding undergoing sedation in the setting of her myotonic dystrophy - reviewed that procedures could be scheduled in a hospital-based setting   GI Review of Symptoms Significant for abdominal pain, vomiting. Otherwise negative.  General Review of Systems  Review of systems is significant for the pertinent positives and negatives as listed per the HPI.  Full ROS is otherwise negative.  Past Medical History   Past Medical History:  Diagnosis Date   Anemia    Anxiety    Arthritis    Cataracts, bilateral    MD just watching   Chronic lower back pain DUE TO MYOTONIC DYSTROPHY   Complication of anesthesia    slow to awaken , respirations either slow or fast.  Depression    patient denies this dx,  Therapist said that patient that she was fine after 2 years- ~2000   Diabetes mellitus without complication (HCC)    Endometriosis    Family history of adverse reaction to anesthesia 2022    brother was discharged after surgery had to be  taken back to hospital  CO2 was elevated, he was in the hospital for 9 days.   GERD (gastroesophageal reflux disease)    diet controlled   History of right foot drop    Migraine    Myotonic dystrophy (HCC) DX JULY 2009  ----- TYPE 1   FOLLOWED BY DR. LOVE   Numbness of fingers BILATERAL HANDS/  OCCASIONLLY RADIATES UP ARMS   Pelvic pain in female    Pneumonia    Seasonal allergies    Sleep apnea    does not use cpap   Vertigo    no current problems in the last year   Vitamin D  deficiency      Past Surgical History   Past Surgical History:  Procedure Laterality Date   BENIGN BREAST BX   FEB  2011   BREAST BIOPSY Right 2011   Benign Stereo    LAPAROSCOPY  05/01/2011   Procedure: LAPAROSCOPY DIAGNOSTIC;  Surgeon: Toribio LITTIE Campanile, MD;  Location: [facility];  Service: Gynecology;  Laterality: N/A;  with bilateral chromopertubation, aspiration of functional left ovarian cyst, excision of two hydatid cysts   MUSCLE BIOPSY  02-25-2007   LEFT THIGH QUADRICEP BX X3  FOR MYOSITIS   OPEN REDUCTION INTERNAL FIXATION (ORIF) FOOT LISFRANC FRACTURE Left 09/05/2020   Procedure: OPEN REDUCTION INTERNAL FIXATION (ORIF) LEFT FOOT LISFRANC FRACTURE DISLOCATION;  Surgeon: Harden Jerona GAILS, MD;  Location: [facility] OR;  Service: Orthopedics;  Laterality: Left;   SURG. FOR REMOVAL OF TEETH FRAGMENTS    MARCH 2012   ORAL SURGEON OFFICE   WISDOM TOOTH EXTRACTION       Allergies and Medications   Allergies  Allergen Reactions   Latex Hives   Aleve [Naproxen Sodium] Other (See Comments)    GETS THE CHILLS   Ciprocin-Fluocin-Procin [Fluocinolone Acetonide] Other (See Comments)    GI UPSET AND HEADACHE   Macrobid [Nitrofurantoin Monohydrate Macrocrystals] Other (See Comments)    GI UPSET; headache   Tramadol  Hcl     Pt had a Hx of dependency on this medication    Trazodone And Nefazodone Other (See Comments)    Extreme sedation     Yellow Dyes (Non-Tartrazine) Nausea And Vomiting    YELLOW DYE #5     Other Rash    [place] JELLY   Current Meds  Medication Sig   [EXPIRED] Na Sulfate-K Sulfate-Mg Sulfate concentrate (SUPREP) 17.5-3.13-1.6 GM/177ML SOLN Take 1 kit (354 mLs total) by mouth once for 1 dose.   ONETOUCH ULTRA test strip TEST BLOOD SUGAR THREE TIMES DAILY AS NEEDED   scopolamine  (TRANSDERM-SCOP) 1 MG/3DAYS Place 1 patch (1 mg total) onto the skin every 3 (three) days.   Semaglutide , 2 MG/DOSE, 8 MG/3ML SOPN Inject 2 mg as directed once a week.     Family History   Family History  Problem Relation Age of Onset   Diabetes Mother    Hypertension Father    Hyperlipidemia Father    Heart disease Father    Hypertension Brother    Diabetes Brother    Atrial fibrillation Brother    Colon cancer Maternal Uncle    Coronary artery disease Maternal Uncle  Cancer Paternal Uncle        Colon cancer   Parkinsonism Paternal Uncle    Heart disease Maternal Grandmother    Diabetes Maternal Grandfather    Heart disease Maternal Grandfather    Coronary artery disease Maternal Grandfather    Heart disease Paternal Grandmother    Stroke Paternal Grandmother    CAD Paternal Grandmother    Heart disease Paternal Grandfather      Social History   Social History   Tobacco Use   Smoking status: Former    Current packs/day: 0.00    Average packs/day: 0.3 packs/day for 10.0 years (2.5 ttl pk-yrs)    Types: Cigarettes    Start date: 06/29/2006    Quit date: 06/28/2016    Years since quitting: 7.2   Smokeless tobacco: Never   Tobacco comments:    varies, 0-0.5 pack per day  Vaping Use   Vaping status: Former   Devices: 1 year none since 2019  Substance Use Topics   Alcohol use: Not Currently    Alcohol/week: 0.0 standard drinks of alcohol    Comment: rare   Drug use: No   Dean reports that she quit smoking about 7 years ago. Her smoking use included cigarettes. She started smoking about 17 years ago. She  has a 2.5 pack-year smoking history. She has never used smokeless tobacco. She reports that she does not currently use alcohol. She reports that she does not use drugs.  Vital Signs and Physical Examination   Vitals:   09/23/23 1342  BP: 122/64  Pulse: (!) 53   Body mass index is 22.85 kg/m. Weight: 129 lb (58.5 kg)  General: Thin, no acute distress Head: Normocephalic and atraumatic Eyes: Sclerae anicteric, EOMI Lungs: Clear throughout to auscultation Heart: Regular rate and rhythm; No murmurs, rubs or bruits Abdomen: Soft, tender to palpation in right upper quadrant, epigastrium and bilateral lower quadrants,and non distended. No masses, hepatosplenomegaly or hernias noted. Normal Bowel sounds Rectal: Deferred Musculoskeletal: Symmetrical with no gross deformities   Review of Data  The following data was reviewed at the time of this encounter:  Laboratory Studies      Latest Ref Rng & Units 09/23/2023    3:08 PM 05/06/2023    4:10 PM 04/20/2023    2:30 PM  CBC  WBC 4.0 - 10.5 K/uL 8.3  8.5  6.6   Hemoglobin 12.0 - 15.0 g/dL 84.8  84.7  85.1   Hematocrit 36.0 - 46.0 % 45.7  46.9  45.7   Platelets 150.0 - 400.0 K/uL 185.0  212  240.0     Lab Results  Component Value Date   LIPASE 90.0 (H) 09/23/2023      Latest Ref Rng & Units 09/23/2023    3:08 PM 04/20/2023    2:30 PM 01/28/2021    3:42 PM  CMP  Glucose 70 - 99 mg/dL 95  875  802   BUN 6 - 23 mg/dL 14  10  8    Creatinine 0.40 - 1.20 mg/dL 9.28  9.29  9.37   Sodium 135 - 145 mEq/L 143  143  140   Potassium 3.5 - 5.1 mEq/L 4.0  3.9  4.3   Chloride 96 - 112 mEq/L 106  107  104   CO2 19 - 32 mEq/L 30  28  26    Calcium  8.4 - 10.5 mg/dL 89.9  9.6  9.5   Total Protein 6.0 - 8.3 g/dL 7.4  7.3  7.4   Total  Bilirubin 0.2 - 1.2 mg/dL 0.6  0.8  0.9   Alkaline Phos 39 - 117 U/L 54  58  76   AST 0 - 37 U/L 25  35  30   ALT 0 - 35 U/L 26  37  28    Lab Results  Component Value Date   TSH 1.81 09/23/2023   Cortisol  5.2   Imaging Studies  None  GI Procedures and Studies  None    Clinical Impression  It is my clinical impression that Ms. Medellin is a 48 y.o. female with;  Persistent vomiting Left lower quadrant abdominal pain Elevated lipase Weight loss Colorectal cancer screening  Cyrene presents to the office today for persistent vomiting and abdominal pain.  She reports a several year history of intermittent nonbloody, nonbilious emesis.  Because of the sporadic nature of her symptoms she previously did not seek evaluation.  Over the last year she has experienced progressive and crescendoing symptoms of vomiting now occurring 2-3 times a week.  Symptoms are postprandial, typically after dinner.  She denies nausea but states that shortly after eating she will have nonbloody, nonbilious emesis associated with abdominal pain that seems to localize to the left lower quadrant.  We discussed that the differential diagnosis for her symptoms could include gastritis, peptic ulcer disease, H. pylori infection, cholelithiasis, a pancreatic disorder, gastroparesis/delayed gastric emptying, anatomic partial bowel obstruction, adrenal insufficiency, celiac disease, thyroid  dysfunction.  After her visit today, her lipase returned mildly elevated suggesting possible mild pancreatitis.  LFTs are normal.  No leukocytosis.  She appeared clinically stable in the office today.  CT imaging was ordered at the time of her visit and will be completed on 09/28/2023.  She is currently on Ozempic  which could be causing a form of pancreatitis and I will advise her to cease this as we proceed with her evaluation.  Will also counsel her that if her symptoms significantly worsen prior to CT scan next week that she should proceed to the emergency room.  At the time of her visit we had also scheduled an upper endoscopy for further evaluation of her symptoms.  Pending workup for her pancreatic issues we can determine if this test is  still needed.  Laura is due for colorectal cancer screening.  There is a history of colorectal cancer in an uncle in his 59s and her father has had colon polyps.  We reviewed colonoscopy procedure and she is amenable to proceeding.   Plan  Labs today: CBC, CMP, CRP, celiac panel, lipase, H. pylori stool antigen Will advise additional follow-up labs: Triglyceride level, calcium , repeat lipase next week Counseled to cease Ozempic  pending further evaluation Schedule CT scan of the abdomen pelvis -currently ordered for 09/28/2023 EGD and colonoscopy to be scheduled at hospital-based setting given history of myotonic dystrophy and patient report of having difficulty awakening with anesthesia Pending workup above will consider possibility of gastric emptying scan Prescription for scopolamine  patch 1.5 mg topically every 3 days  Planned Follow Up 3 months  The patient or caregiver verbalized understanding of the material covered, with no barriers to understanding. All questions were answered. Patient or caregiver is agreeable with the plan outlined above.    It was a pleasure to see Janyth.  If you have any questions or concerns regarding this evaluation, do not hesitate to contact me.  Inocente Hausen, MD [facility]   ADDENDUM: I spoke on the telephone with Zaharah 09/24/2023 apprising her of the result and my recommendation  to hold Ozempic  for now while investigating her mildly elevated lipase.  Counseled regarding low-fat and bland diet.  I called Dr. Mira office and relayed information about results to discuss what medication patient could be transitioned to for management of her diabetes while the issue is being evaluated.  I spent total of 45 minutes in both face-to-face (25  minutes interview) and non-face-to-face (20 minutes chart review, care coordination, documentation)  activities, excluding procedures performed, for the visit on the date of this encounter.  ADDENDUM  11/09/23: Received communication from Dr. Albertus who takes care of Aamiyah's father that her father has had a history of both adenomatous and sessile serrated polyps.  He was also diagnosed with colorectal cancer 1 year ago.  The history of colorectal cancer in a first-degree relative was not known at the time of Dannae's initial clinic visit.  Her colonoscopy revealed hyperplastic polyps in the rectum.  Initially advised a 10-year follow-up colonoscopy.  In light of information related to a family history of colorectal cancer in a first-degree relative will adjust recall interval to 5 years.

## 2023-09-23 ENCOUNTER — Other Ambulatory Visit (INDEPENDENT_AMBULATORY_CARE_PROVIDER_SITE_OTHER)

## 2023-09-23 ENCOUNTER — Ambulatory Visit: Admitting: Pediatrics

## 2023-09-23 ENCOUNTER — Encounter: Payer: Self-pay | Admitting: Pediatrics

## 2023-09-23 VITALS — BP 122/64 | HR 53 | Ht 63.0 in | Wt 129.0 lb

## 2023-09-23 DIAGNOSIS — R748 Abnormal levels of other serum enzymes: Secondary | ICD-10-CM

## 2023-09-23 DIAGNOSIS — R1110 Vomiting, unspecified: Secondary | ICD-10-CM

## 2023-09-23 DIAGNOSIS — Z1211 Encounter for screening for malignant neoplasm of colon: Secondary | ICD-10-CM

## 2023-09-23 DIAGNOSIS — R634 Abnormal weight loss: Secondary | ICD-10-CM

## 2023-09-23 DIAGNOSIS — R1032 Left lower quadrant pain: Secondary | ICD-10-CM

## 2023-09-23 LAB — CBC WITH DIFFERENTIAL/PLATELET
Basophils Absolute: 0.1 K/uL (ref 0.0–0.1)
Basophils Relative: 0.9 % (ref 0.0–3.0)
Eosinophils Absolute: 0.1 K/uL (ref 0.0–0.7)
Eosinophils Relative: 1 % (ref 0.0–5.0)
HCT: 45.7 % (ref 36.0–46.0)
Hemoglobin: 15.1 g/dL — ABNORMAL HIGH (ref 12.0–15.0)
Lymphocytes Relative: 23.7 % (ref 12.0–46.0)
Lymphs Abs: 2 K/uL (ref 0.7–4.0)
MCHC: 33 g/dL (ref 30.0–36.0)
MCV: 91.2 fl (ref 78.0–100.0)
Monocytes Absolute: 0.5 K/uL (ref 0.1–1.0)
Monocytes Relative: 5.7 % (ref 3.0–12.0)
Neutro Abs: 5.7 K/uL (ref 1.4–7.7)
Neutrophils Relative %: 68.7 % (ref 43.0–77.0)
Platelets: 185 K/uL (ref 150.0–400.0)
RBC: 5.02 Mil/uL (ref 3.87–5.11)
RDW: 14.4 % (ref 11.5–15.5)
WBC: 8.3 K/uL (ref 4.0–10.5)

## 2023-09-23 LAB — CORTISOL: Cortisol, Plasma: 5.2 ug/dL

## 2023-09-23 LAB — COMPREHENSIVE METABOLIC PANEL WITH GFR
ALT: 26 U/L (ref 0–35)
AST: 25 U/L (ref 0–37)
Albumin: 4.3 g/dL (ref 3.5–5.2)
Alkaline Phosphatase: 54 U/L (ref 39–117)
BUN: 14 mg/dL (ref 6–23)
CO2: 30 meq/L (ref 19–32)
Calcium: 10 mg/dL (ref 8.4–10.5)
Chloride: 106 meq/L (ref 96–112)
Creatinine, Ser: 0.71 mg/dL (ref 0.40–1.20)
GFR: 100.36 mL/min (ref >60.00)
Glucose, Bld: 95 mg/dL (ref 70–99)
Potassium: 4 meq/L (ref 3.5–5.1)
Sodium: 143 meq/L (ref 135–145)
Total Bilirubin: 0.6 mg/dL (ref 0.2–1.2)
Total Protein: 7.4 g/dL (ref 6.0–8.3)

## 2023-09-23 LAB — TSH: TSH: 1.81 u[IU]/mL (ref 0.35–5.50)

## 2023-09-23 LAB — SEDIMENTATION RATE: Sed Rate: 27 mm/h — ABNORMAL HIGH (ref 0–20)

## 2023-09-23 LAB — C-REACTIVE PROTEIN: CRP: <1 mg/dL (ref 0.5–20.0)

## 2023-09-23 LAB — LIPASE: Lipase: 90 U/L — ABNORMAL HIGH (ref 11.0–59.0)

## 2023-09-23 MED ORDER — NA SULFATE-K SULFATE-MG SULF 17.5-3.13-1.6 GM/177ML PO SOLN: 1.0000 | Freq: Once | ORAL | 0 refills | Status: AC

## 2023-09-23 MED ORDER — SCOPOLAMINE 1 MG/3DAYS TD PT72: 1.0000 | MEDICATED_PATCH | TRANSDERMAL | 3 refills | Status: DC

## 2023-09-23 NOTE — Patient Instructions (Signed)
 Your provider has requested that you go to the basement level for lab work before leaving today. Press B on the elevator. The lab is located at the first door on the left as you exit the elevator.  Due to recent changes in healthcare laws, you may see the results of your imaging and laboratory studies on MyChart before your provider has had a chance to review them.  We understand that in some cases there may be results that are confusing or concerning to you. Not all laboratory results come back in the same time frame and the provider may be waiting for multiple results in order to interpret others.  Please give us  48 hours in order for your provider to thoroughly review all the results before contacting the office for clarification of your results.    You have been scheduled for a CT scan of the abdomen and pelvis at [facility], 1st floor Radiology. You are scheduled on 09/28/23 at 7:30 am. You should arrive 15 minutes prior to your appointment time for registration.    Please follow the written instructions below on the day of your exam:   1) Do not eat anything after midnight.   You may take any medications as prescribed with a small amount of water, if necessary. If you take any of the following medications: METFORMIN , GLUCOPHAGE , GLUCOVANCE, AVANDAMET, RIOMET , FORTAMET , ACTOPLUS MET, JANUMET, GLUMETZA  or METAGLIP, you MAY be asked to HOLD this medication 48 hours AFTER the exam.   The purpose of you drinking the oral contrast is to aid in the visualization of your intestinal tract. The contrast solution may cause some diarrhea. Depending on your individual set of symptoms, you may also receive an intravenous injection of x-ray contrast/dye. Plan on being at [facility] for 45 minutes or longer, depending on the type of exam you are having performed.   If you have any questions regarding your exam or if you need to reschedule, you may call [facility] at [phone] between  the hours of 8:00 am and 5:00 pm, Monday-Friday.   We have sent the following medications to your pharmacy for you to pick up at your convenience: Scopalamine patch, apply 1 patch every 3 days.  Please complete a trial of the gatsroparesis diet.  You have been scheduled for an endoscopy and colonoscopy. Please follow the written instructions given to you at your visit today.  If you use inhalers (even only as needed), please bring them with you on the day of your procedure.  DO NOT TAKE 7 DAYS PRIOR TO TEST- Trulicity (dulaglutide) Ozempic , Wegovy  (semaglutide ) Mounjaro  (tirzepatide ) Bydureon Bcise (exanatide extended release)  DO NOT TAKE 1 DAY PRIOR TO YOUR TEST Rybelsus  (semaglutide ) Adlyxin (lixisenatide) Victoza (liraglutide) Byetta (exanatide) ___________________________________________________________________________   Follow up in 3 months after your procedure.  Thank you for entrusting me with your care and for choosing [facility], Dr. Inocente Hausen  _______________________________________________________  If your blood pressure at your visit was 140/90 or greater, please contact your primary care physician to follow up on this.  _______________________________________________________  If you are age 5 or older, your body mass index should be between 23-30. Your Body mass index is 22.85 kg/m. If this is out of the aforementioned range listed, please consider follow up with your Primary Care Provider.  If you are age 20 or younger, your body mass index should be between 19-25. Your Body mass index is 22.85 kg/m. If this is out of the aformentioned range listed,  please consider follow up with your Primary Care Provider.   ________________________________________________________  The  GI providers would like to encourage you to use MYCHART to communicate with providers for non-urgent requests or questions.  Due to long hold times on the telephone,  sending your provider a message by [facility] may be a faster and more efficient way to get a response.  Please allow 48 business hours for a response.  Please remember that this is for non-urgent requests.  _______________________________________________________  [facility] is using a team-based approach to care.  Your team is made up of your doctor and two to three APPS. Our APPS (Nurse Practitioners and Physician Assistants) work with your physician to ensure care continuity for you. They are fully qualified to address your health concerns and develop a treatment plan. They communicate directly with your gastroenterologist to care for you. Seeing the Advanced Practice Practitioners on your physician's team can help you by facilitating care more promptly, often allowing for earlier appointments, access to diagnostic testing, procedures, and other specialty referrals.

## 2023-09-24 ENCOUNTER — Telehealth: Payer: Self-pay | Admitting: *Deleted

## 2023-09-24 ENCOUNTER — Encounter: Payer: Self-pay | Admitting: Pediatrics

## 2023-09-24 LAB — IGA: Immunoglobulin A: 250 mg/dL (ref 47–310)

## 2023-09-24 LAB — TISSUE TRANSGLUTAMINASE ABS,IGG,IGA
(tTG) Ab, IgA: <1 U/mL
(tTG) Ab, IgG: <1 U/mL

## 2023-09-24 NOTE — Telephone Encounter (Signed)
 Copied from CRM [id number]. Topic: Clinical - Lab/Test Results >> Sep 24, 2023  9:54 AM [name] wrote: Reason for CRM: Dr. Suzann calling from gastroenterology wanting to inform Dr. Johnny of the patient's diagnosis of mild pancreatitis. She is needing to go over diabetes management and discuss the cancellation of the patient's ozempic . She explained that the patient is not sick enough for ER, but she will keep an eye on that. If she does not receive a call by later today, she will inform the patient to go to the ER.  Dr. Suzann: [phone]

## 2023-09-25 ENCOUNTER — Telehealth: Payer: Self-pay | Admitting: Family Medicine

## 2023-09-25 ENCOUNTER — Telehealth: Payer: Self-pay | Admitting: *Deleted

## 2023-09-25 NOTE — Telephone Encounter (Signed)
Pt message routed to PCP for advise.

## 2023-09-25 NOTE — Telephone Encounter (Signed)
 Copied from CRM [id number]. Topic: General - Call Back - No Documentation >> Sep 25, 2023  9:47 AM Valerie Friedman wrote: Reason for CRM: Patient is calling requesting to speak with Dr. Johnny or his nurse to please follow up with patient in regards to her Ozempic  and the message that her GI physician Dr. Suzann sent over in regards to what she needs to do going forward into this weekend.    [phone] (M) >> Sep 25, 2023  1:59 PM Valerie Friedman wrote: Patient is calling in again regarding stopping the Ozempic , patient is asking if another diabetic medication will be called in  as she can not go without any diabetic medications this weekend. Please advise the patient.

## 2023-09-25 NOTE — Telephone Encounter (Signed)
 Copied from CRM [id number]. Topic: General - Call Back - No Documentation >> Sep 25, 2023  9:47 AM Rea BROCKS wrote: Reason for CRM: Patient is calling requesting to speak with Dr. Johnny or his nurse to please follow up with patient in regards to her Ozempic  and the message that her GI physician Dr. Suzann sent over in regards to what she needs to do going forward into this weekend.    [phone] (M)

## 2023-09-25 NOTE — Telephone Encounter (Signed)
 Hi Caragh -   I am following up to let you know that I left a message with Dr. Mira office this morning so that we can discuss your mildly elevated lipase and diabetes management.   Ozempic  is long-acting and will cover your glucose control for period of time.  When is your next dose due?   I will update you when I hear back from him about next steps.  Hopefully that will be by later today or tomorrow.   As long as your symptoms remain mild I think we can continue to manage things as an outpatient.  I would recommend consuming a low-fat diet and leaning towards bland foods.  Hi fat and greasy foods can exacerbate the pancreas and pancreatitis.   If you develop significantly worsening symptoms (ie; worsening abdominal pain, vomiting, fevers ) before your upcoming CT then you should go to the emergency department for further evaluation.   Best,   Inocente Hausen

## 2023-09-26 ENCOUNTER — Ambulatory Visit: Payer: Self-pay | Admitting: Pediatrics

## 2023-09-26 ENCOUNTER — Encounter: Payer: Self-pay | Admitting: Pediatrics

## 2023-09-26 DIAGNOSIS — R748 Abnormal levels of other serum enzymes: Secondary | ICD-10-CM

## 2023-09-26 DIAGNOSIS — R1032 Left lower quadrant pain: Secondary | ICD-10-CM

## 2023-09-26 DIAGNOSIS — R1110 Vomiting, unspecified: Secondary | ICD-10-CM

## 2023-09-28 ENCOUNTER — Encounter (HOSPITAL_COMMUNITY): Payer: Self-pay

## 2023-09-28 ENCOUNTER — Ambulatory Visit (HOSPITAL_COMMUNITY)
Admission: RE | Admit: 2023-09-28 | Discharge: 2023-09-28 | Disposition: A | Discharge status: Home / Self Care | Source: Ambulatory Visit | Attending: Pediatrics | Admitting: Pediatrics

## 2023-09-28 DIAGNOSIS — R1110 Vomiting, unspecified: Secondary | ICD-10-CM | POA: Insufficient documentation

## 2023-09-28 DIAGNOSIS — R1032 Left lower quadrant pain: Secondary | ICD-10-CM | POA: Diagnosis not present

## 2023-09-28 DIAGNOSIS — K8020 Calculus of gallbladder without cholecystitis without obstruction: Secondary | ICD-10-CM | POA: Diagnosis not present

## 2023-09-28 MED ORDER — IOHEXOL 300 MG/ML  SOLN
100.0000 mL | Freq: Once | INTRAMUSCULAR | Status: AC | PRN
  Administered 2023-09-28: 100 mL via INTRAVENOUS

## 2023-09-28 NOTE — Telephone Encounter (Signed)
 Spoke with pt states that the GI doctor send a message on Friday regarding pt Sed Rate which was high, the GI Dr thought that her pancrease is inflamed, pt had a CT of the pancrease this morning, the Gi Dr seems to think Ozempic  could be causing the elevation in her labs though not sure. Pt had her last dose of Ozempic  on Sunday, pt wants to know what she should do moving on. Please advise

## 2023-09-28 NOTE — Telephone Encounter (Signed)
 Tell Valerie Friedman to hold the Ozempic  for the next few weeks to let her pancreas settle down. I am waiting to see the results of the CT scan she had this morning also

## 2023-09-28 NOTE — Telephone Encounter (Signed)
 As I said in my earlier answer, she should hold the Ozempic  for a few weeks to let her pancreas settle down. Then we can discuss more long term plans

## 2023-09-30 NOTE — Telephone Encounter (Signed)
 Spoke with pt message sent to Dr Johnny for advise

## 2023-09-30 NOTE — Telephone Encounter (Signed)
 Spoke with Valerie Friedman advised of Dr Johnny recommendation Valerie Friedman states that she need a prescription to help her blood glucose if she will not take Ozempic  for a few weeks. Valerie Friedman CT scan results are back and wants Dr Johnny to review results and advise of the next step.

## 2023-10-01 NOTE — Telephone Encounter (Signed)
 I reviewed her CT scan, and her pancreas was completely normal. She can resume taking the Ozempic  shots now

## 2023-10-01 NOTE — Telephone Encounter (Signed)
 Pt advised of Dr Johnny recommendation, voiced understanding

## 2023-10-07 ENCOUNTER — Encounter: Payer: Self-pay | Admitting: Pediatrics

## 2023-10-12 ENCOUNTER — Ambulatory Visit: Payer: Self-pay

## 2023-10-12 NOTE — Telephone Encounter (Signed)
 FYI Only or Action Required?: FYI only for provider.  Patient was last seen in primary care on 08/11/2023 by Valerie Friedman LABOR, MD.  Called Nurse Triage reporting Shoulder Pain and Wrist Pain.  Symptoms began several weeks ago.  Interventions attempted: OTC medications: Tylenol  and Rest, hydration, or home remedies.  Symptoms are: unchanged.  Triage Disposition: See PCP When Office is Open (Within 3 Days)  Patient/caregiver understands and will follow disposition?: Yes   Copied from CRM #[id number]. Topic: Clinical - Red Word Triage >> Oct 12, 2023  1:01 PM [name] wrote: [facility] that prompted transfer to Nurse Triage: Patient is having right shoulder pain and left wrist pain. Patient does not have good range of motions. Reason for Disposition  [1] MODERATE pain (e.g., interferes with normal activities) AND [2] present > 3 days  Answer Assessment - Initial Assessment Questions Additional info: Overweekend she took one percocet she had left over prescription, this took her pain away. Using tylenol  which decreased pain but not relieved.    1. ONSET: When did the pain start?     2 weeks 2. LOCATION: Where is the pain located?     Right shoulder 3. PAIN: How bad is the pain? (Scale 1-10; or mild, moderate, severe)     Feels like arthritis-intermittent. Pain worse with movement.  4. 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 CAUSE: What do you think is causing the shoulder pain?     unsure 6. OTHER SYMPTOMS: Do you have any other symptoms? (e.g., neck pain, swelling, rash, fever, numbness, weakness)       Intermittent left wrist pain. Limited ROM of shoulder and wrist. Denies all other symptoms.  Protocols used: Shoulder Pain-A-AH

## 2023-10-12 NOTE — Telephone Encounter (Signed)
 noted

## 2023-10-13 ENCOUNTER — Encounter (HOSPITAL_COMMUNITY): Payer: Self-pay | Admitting: Pediatrics

## 2023-10-13 NOTE — Progress Notes (Signed)
 Attempted to obtain medical history for pre op call via telephone, unable to reach at this time. HIPAA compliant voicemail message left requesting return call to pre surgical testing department.

## 2023-10-14 ENCOUNTER — Ambulatory Visit: Admitting: Family Medicine

## 2023-10-16 ENCOUNTER — Ambulatory Visit (INDEPENDENT_AMBULATORY_CARE_PROVIDER_SITE_OTHER): Admitting: Family Medicine

## 2023-10-16 ENCOUNTER — Telehealth: Payer: Self-pay

## 2023-10-16 ENCOUNTER — Encounter: Payer: Self-pay | Admitting: Family Medicine

## 2023-10-16 VITALS — BP 98/60 | HR 87 | Temp 98.0°F | Wt 126.6 lb

## 2023-10-16 DIAGNOSIS — R059 Cough, unspecified: Secondary | ICD-10-CM

## 2023-10-16 DIAGNOSIS — M25511 Pain in right shoulder: Secondary | ICD-10-CM | POA: Diagnosis not present

## 2023-10-16 DIAGNOSIS — E1165 Type 2 diabetes mellitus with hyperglycemia: Secondary | ICD-10-CM

## 2023-10-16 LAB — POCT INFLUENZA A/B
Influenza A, POC: NEGATIVE
Influenza B, POC: NEGATIVE

## 2023-10-16 LAB — POCT GLYCOSYLATED HEMOGLOBIN (HGB A1C): Hemoglobin A1C: 5.8 % — AB (ref 4.0–5.6)

## 2023-10-16 LAB — POC COVID19 BINAXNOW: SARS Coronavirus 2 Ag: NEGATIVE

## 2023-10-16 NOTE — Progress Notes (Signed)
   Subjective:    Patient ID: Lennart CHRISTELLA Flair, female    DOB: 07-08-1975, 48 y.o.   MRN: [id number]  HPI Here for 3 weeks of pain in the right shoulder. Most of the pain is the the front of the shoulder although it hurts some in the back. No recent trauma.    Review of Systems  Constitutional: Negative.   Respiratory: Negative.    Cardiovascular: Negative.   Musculoskeletal:  Positive for arthralgias.       Objective:   Physical Exam Constitutional:      Appearance: Normal appearance.     Comments: She splints the right arm a little   Cardiovascular:     Rate and Rhythm: Normal rate and regular rhythm.     Pulses: Normal pulses.     Heart sounds: Normal heart sounds.  Pulmonary:     Effort: Pulmonary effort is normal.     Breath sounds: Normal breath sounds.  Musculoskeletal:     Comments: She is very tender in the anterior right shoulder, less so on the posterior shoulder. ROM is very limited due to pain.   Neurological:     Mental Status: She is alert.           Assessment & Plan:  Right shoulder pain, likely due to biceps tendonitis. She will rest the shoulder and apply ice packs. She may also apply Voltaren  gel QID as needed.  Garnette Olmsted, MD

## 2023-10-16 NOTE — Telephone Encounter (Signed)
 Procedure:COLON Procedure date: 10/20/23 Procedure location: [facility] Arrival Time: 6:45 Spoke with the patient Y/N: Y Any prep concerns? N  Has the patient obtained the prep from the pharmacy ? Y Do you have a care partner and transportation: Y Any additional concerns? N

## 2023-10-16 NOTE — Addendum Note (Signed)
 Addended by: LADONNA INOCENTE SAILOR on: 10/16/2023 04:14 PM   Modules accepted: Orders

## 2023-10-20 ENCOUNTER — Encounter (HOSPITAL_COMMUNITY)
Admission: RE | Disposition: A | Discharge status: Home / Self Care | Payer: Self-pay | Source: Home / Self Care | Attending: Pediatrics

## 2023-10-20 ENCOUNTER — Ambulatory Visit (HOSPITAL_COMMUNITY): Admitting: Anesthesiology

## 2023-10-20 ENCOUNTER — Ambulatory Visit (HOSPITAL_COMMUNITY)
Admission: RE | Admit: 2023-10-20 | Discharge: 2023-10-20 | Disposition: A | Discharge status: Home / Self Care | Attending: Pediatrics | Admitting: Pediatrics

## 2023-10-20 ENCOUNTER — Encounter (HOSPITAL_COMMUNITY): Payer: Self-pay | Admitting: Pediatrics

## 2023-10-20 ENCOUNTER — Other Ambulatory Visit: Payer: Self-pay

## 2023-10-20 DIAGNOSIS — F32A Depression, unspecified: Secondary | ICD-10-CM | POA: Diagnosis not present

## 2023-10-20 DIAGNOSIS — E119 Type 2 diabetes mellitus without complications: Secondary | ICD-10-CM | POA: Diagnosis not present

## 2023-10-20 DIAGNOSIS — G4730 Sleep apnea, unspecified: Secondary | ICD-10-CM | POA: Insufficient documentation

## 2023-10-20 DIAGNOSIS — R1032 Left lower quadrant pain: Secondary | ICD-10-CM | POA: Insufficient documentation

## 2023-10-20 DIAGNOSIS — Z1211 Encounter for screening for malignant neoplasm of colon: Secondary | ICD-10-CM | POA: Diagnosis not present

## 2023-10-20 DIAGNOSIS — K635 Polyp of colon: Secondary | ICD-10-CM | POA: Diagnosis not present

## 2023-10-20 DIAGNOSIS — Z79899 Other long term (current) drug therapy: Secondary | ICD-10-CM | POA: Insufficient documentation

## 2023-10-20 DIAGNOSIS — R1110 Vomiting, unspecified: Secondary | ICD-10-CM | POA: Diagnosis not present

## 2023-10-20 DIAGNOSIS — K621 Rectal polyp: Secondary | ICD-10-CM

## 2023-10-20 DIAGNOSIS — Z83719 Family history of colon polyps, unspecified: Secondary | ICD-10-CM | POA: Diagnosis not present

## 2023-10-20 DIAGNOSIS — K2950 Unspecified chronic gastritis without bleeding: Secondary | ICD-10-CM | POA: Diagnosis not present

## 2023-10-20 DIAGNOSIS — R519 Headache, unspecified: Secondary | ICD-10-CM | POA: Diagnosis not present

## 2023-10-20 DIAGNOSIS — Z7985 Long-term (current) use of injectable non-insulin antidiabetic drugs: Secondary | ICD-10-CM | POA: Diagnosis not present

## 2023-10-20 DIAGNOSIS — Z87891 Personal history of nicotine dependence: Secondary | ICD-10-CM | POA: Insufficient documentation

## 2023-10-20 DIAGNOSIS — M797 Fibromyalgia: Secondary | ICD-10-CM | POA: Insufficient documentation

## 2023-10-20 DIAGNOSIS — K219 Gastro-esophageal reflux disease without esophagitis: Secondary | ICD-10-CM | POA: Insufficient documentation

## 2023-10-20 DIAGNOSIS — F419 Anxiety disorder, unspecified: Secondary | ICD-10-CM | POA: Insufficient documentation

## 2023-10-20 DIAGNOSIS — F418 Other specified anxiety disorders: Secondary | ICD-10-CM | POA: Diagnosis not present

## 2023-10-20 DIAGNOSIS — R1115 Cyclical vomiting syndrome unrelated to migraine: Secondary | ICD-10-CM

## 2023-10-20 DIAGNOSIS — G4733 Obstructive sleep apnea (adult) (pediatric): Secondary | ICD-10-CM

## 2023-10-20 DIAGNOSIS — Z833 Family history of diabetes mellitus: Secondary | ICD-10-CM | POA: Insufficient documentation

## 2023-10-20 HISTORY — PX: COLONOSCOPY: SHX5424

## 2023-10-20 HISTORY — PX: ESOPHAGOGASTRODUODENOSCOPY: SHX5428

## 2023-10-20 LAB — GLUCOSE, CAPILLARY: Glucose-Capillary: 100 mg/dL — ABNORMAL HIGH (ref 70–99)

## 2023-10-20 SURGERY — COLONOSCOPY
Anesthesia: Monitor Anesthesia Care

## 2023-10-20 MED ORDER — PHENYLEPHRINE 80 MCG/ML (10ML) SYRINGE FOR IV PUSH (FOR BLOOD PRESSURE SUPPORT)
PREFILLED_SYRINGE | INTRAVENOUS | Status: DC | PRN
  Administered 2023-10-20: 160 ug via INTRAVENOUS
  Administered 2023-10-20: 80 ug via INTRAVENOUS

## 2023-10-20 MED ORDER — SODIUM CHLORIDE 0.9 % IV SOLN
INTRAVENOUS | Status: AC | PRN
  Administered 2023-10-20: 500 mL via INTRAMUSCULAR

## 2023-10-20 MED ORDER — PROPOFOL 1000 MG/100ML IV EMUL
INTRAVENOUS | Status: AC
  Filled 2023-10-20: qty 100

## 2023-10-20 MED ORDER — PROPOFOL 500 MG/50ML IV EMUL
INTRAVENOUS | Status: DC | PRN
  Administered 2023-10-20: 100 mg via INTRAVENOUS
  Administered 2023-10-20: 80 ug/kg/min via INTRAVENOUS
  Administered 2023-10-20: 30 mg via INTRAVENOUS

## 2023-10-20 MED ORDER — MIDAZOLAM HCL 2 MG/2ML IJ SOLN
INTRAMUSCULAR | Status: AC
  Filled 2023-10-20: qty 2

## 2023-10-20 MED ORDER — PROPOFOL 10 MG/ML IV BOLUS
INTRAVENOUS | Status: AC
  Filled 2023-10-20: qty 20

## 2023-10-20 MED ORDER — MIDAZOLAM HCL 2 MG/2ML IJ SOLN
INTRAMUSCULAR | Status: DC | PRN
Start: 2023-10-20 — End: 2023-10-20
  Administered 2023-10-20: 2 mg via INTRAVENOUS

## 2023-10-20 NOTE — Anesthesia Preprocedure Evaluation (Signed)
 Anesthesia Evaluation  Patient identified by MRN, date of birth, ID band Patient awake  General Assessment Comment:  Very anxious patient. Patient says she's had complications with anesthesia in the past, though its unclear the true nature of it - she says she had to be unexpectedly intubated during endometriosis surgery (though it seems very unusual that she would not have been intubated at the outset for such a surgery) and said that her gynecologist had to intubate her. Her most recent surgery from a few years ago went without any issues per patient.  Reviewed: Allergy & Precautions, NPO status , Patient's Chart, lab work & pertinent test results  History of Anesthesia Complications (+) history of anesthetic complications  Airway Mallampati: II  TM Distance: >3 FB Neck ROM: Full    Dental no notable dental hx. (+) Teeth Intact   Pulmonary sleep apnea , neg COPD, Patient abstained from smoking.Not current smoker, former smoker   Pulmonary exam normal breath sounds clear to auscultation       Cardiovascular Exercise Tolerance: Good METS(-) hypertension(-) CAD and (-) Past MI negative cardio ROS (-) dysrhythmias  Rhythm:Regular Rate:Normal - Systolic murmurs    Neuro/Psych  Headaches PSYCHIATRIC DISORDERS Anxiety Depression       GI/Hepatic ,GERD  ,,(+)     (-) substance abuse    Endo/Other  diabetes  GLP1 agonist held for 7 days. Denies GI symptoms today  Renal/GU negative Renal ROS     Musculoskeletal  (+)  Fibromyalgia -  Abdominal   Peds  Hematology   Anesthesia Other Findings Past Medical History: No date: Anemia No date: Anxiety No date: Arthritis No date: Cataracts, bilateral     Comment:  MD just watching DUE TO MYOTONIC DYSTROPHY: Chronic lower back pain No date: Complication of anesthesia     Comment:  slow to awaken , respirations either slow or fast. No date: Depression     Comment:  patient  denies this dx,  Therapist said that patient               that she was fine after 2 years- ~2000 No date: Diabetes mellitus without complication (HCC) No date: Endometriosis 2022: Family history of adverse reaction to anesthesia     Comment:  brother was discharged after surgery had to be  taken               back to hospital  CO2 was elevated, he was in the               hospital for 9 days. No date: GERD (gastroesophageal reflux disease)     Comment:  diet controlled No date: History of right foot drop No date: Migraine DX JULY 2009  ----- TYPE 1: Myotonic dystrophy (HCC)     Comment:  FOLLOWED BY DR. MAURICE BILATERAL HANDS/  OCCASIONLLY RADIATES UP ARMS: Numbness of fingers No date: Pelvic pain in female No date: Pneumonia No date: Seasonal allergies No date: Sleep apnea     Comment:  does not use cpap No date: Vertigo     Comment:  no current problems in the last year No date: Vitamin D  deficiency  Reproductive/Obstetrics                              Anesthesia Physical Anesthesia Plan  ASA: 2  Anesthesia Plan: MAC   Post-op Pain Management: Minimal or no pain anticipated   Induction: Intravenous  PONV  Risk Score and Plan: 2 and Propofol  infusion, TIVA and Ondansetron   Airway Management Planned: Nasal Cannula  Additional Equipment: None  Intra-op Plan:   Post-operative Plan:   Informed Consent: I have reviewed the patients History and Physical, chart, labs and discussed the procedure including the risks, benefits and alternatives for the proposed anesthesia with the patient or authorized representative who has indicated his/her understanding and acceptance.     Dental advisory given  Plan Discussed with: CRNA and Surgeon  Anesthesia Plan Comments: (Discussed risks of anesthesia with patient, including possibility of difficulty with spontaneous ventilation under anesthesia necessitating airway intervention, PONV, and rare risks such as  cardiac or respiratory or neurological events, and allergic reactions. Discussed the role of CRNA in patient's perioperative care. Patient understands.)        Anesthesia Quick Evaluation

## 2023-10-20 NOTE — H&P (Signed)
 [facility] History and Physical   Primary Care Physician:  Johnny Garnette LABOR, MD   Reason for Procedure:  Vomiting, left lower quadrant abdominal pain, weight loss, colorectal cancer screening  Plan:    Upper endoscopy and colonoscopy     HPI: Valerie Friedman is a 48 y.o. female undergoing upper endoscopy and colonoscopy for investigation of symptoms of vomiting, left lower quadrant abdominal pain, weight loss as well as colorectal cancer screening.  Patient reports a history of chronic vomiting progressively worsening over the recent past to 2-3 times per week.  No nausea.  Endorses pain in the left lower quadrant present before episodes of emesis.  Had mildly elevated lipase without radiographic evidence of pancreatitis.  She is due for colorectal cancer screening.  No prior colonoscopy.  Reports a paternal uncle diagnosed with colorectal cancer in his 25s.  Her father has had colon polyps.   Past Medical History:  Diagnosis Date   Anemia    Anxiety    Arthritis    Cataracts, bilateral    MD just watching   Chronic lower back pain DUE TO MYOTONIC DYSTROPHY   Complication of anesthesia    slow to awaken , respirations either slow or fast.   Depression    patient denies this dx,  Therapist said that patient that she was fine after 2 years- ~2000   Diabetes mellitus without complication (HCC)    Endometriosis    Family history of adverse reaction to anesthesia 2022   brother was discharged after surgery had to be  taken back to hospital  CO2 was elevated, he was in the hospital for 9 days.   GERD (gastroesophageal reflux disease)    diet controlled   History of right foot drop    Migraine    Myotonic dystrophy (HCC) DX JULY 2009  ----- TYPE 1   FOLLOWED BY DR. LOVE   Numbness of fingers BILATERAL HANDS/  OCCASIONLLY RADIATES UP ARMS   Pelvic pain in female    Pneumonia    Seasonal allergies    Sleep apnea    does not use cpap   Vertigo    no current  problems in the last year   Vitamin D  deficiency     Past Surgical History:  Procedure Laterality Date   BENIGN BREAST BX   FEB  2011   BREAST BIOPSY Right 2011   Benign Stereo    LAPAROSCOPY  05/01/2011   Procedure: LAPAROSCOPY DIAGNOSTIC;  Surgeon: Toribio LITTIE Campanile, MD;  Location: [facility];  Service: Gynecology;  Laterality: N/A;  with bilateral chromopertubation, aspiration of functional left ovarian cyst, excision of two hydatid cysts   MUSCLE BIOPSY  02-25-2007   LEFT THIGH QUADRICEP BX X3  FOR MYOSITIS   OPEN REDUCTION INTERNAL FIXATION (ORIF) FOOT LISFRANC FRACTURE Left 09/05/2020   Procedure: OPEN REDUCTION INTERNAL FIXATION (ORIF) LEFT FOOT LISFRANC FRACTURE DISLOCATION;  Surgeon: Harden Jerona GAILS, MD;  Location: [facility] OR;  Service: Orthopedics;  Laterality: Left;   SURG. FOR REMOVAL OF TEETH FRAGMENTS    MARCH 2012   ORAL SURGEON OFFICE   WISDOM TOOTH EXTRACTION      Prior to Admission medications   Medication Sig Start Date End Date Taking? Authorizing Provider  acetaminophen  (TYLENOL ) 500 MG tablet Take 1,000 mg by mouth. 06/04/18   [provider]  ketoconazole  (NIZORAL ) 2 % cream Apply 1 Application topically 2 (two) times daily as needed for irritation. 11/17/22   Johnny Garnette LABOR, MD  ONETOUCH ULTRA test strip TEST BLOOD SUGAR THREE TIMES DAILY AS NEEDED 06/15/23   Johnny Garnette LABOR, MD  scopolamine  (TRANSDERM-SCOP) 1 MG/3DAYS Place 1 patch (1 mg total) onto the skin every 3 (three) days. 09/23/23   Suzann Inocente HERO, MD  Semaglutide , 2 MG/DOSE, 8 MG/3ML SOPN Inject 2 mg as directed once a week. 06/19/23   Johnny Garnette LABOR, MD  isometheptene-acetaminophen -dichloralphenazone (MIDRIN) 65-325-100 MG per capsule Take 1 capsule by mouth 4 (four) times daily as needed. Prn   04/09/11  [provider]    Current Facility-Administered Medications  Medication Dose Route Frequency Provider Last Rate Last Admin   0.9 %  sodium chloride  infusion    Continuous PRN  Suzann Inocente HERO, MD 10 mL/hr at 10/20/23 0749 500 mL at 10/20/23 0749    Allergies as of 09/23/2023 - Review Complete 09/23/2023  Allergen Reaction Noted   Latex Hives 09/20/2010   Aleve [naproxen sodium] Other (See Comments) 09/20/2010   Ciprocin-fluocin-procin [fluocinolone acetonide] Other (See Comments) 09/20/2010   Macrobid [nitrofurantoin monohydrate macrocrystals] Other (See Comments) 09/20/2010   Tramadol  hcl  06/22/2017   Trazodone and nefazodone Other (See Comments) 01/08/2022   Yellow dyes (non-tartrazine) Nausea And Vomiting 09/20/2010   Other Rash 09/20/2010    Family History  Problem Relation Age of Onset   Diabetes Mother    Hypertension Father    Hyperlipidemia Father    Heart disease Father    Hypertension Brother    Diabetes Brother    Atrial fibrillation Brother    Colon cancer Maternal Uncle    Coronary artery disease Maternal Uncle    Cancer Paternal Uncle        Colon cancer   Parkinsonism Paternal Uncle    Heart disease Maternal Grandmother    Diabetes Maternal Grandfather    Heart disease Maternal Grandfather    Coronary artery disease Maternal Grandfather    Heart disease Paternal Grandmother    Stroke Paternal Grandmother    CAD Paternal Grandmother    Heart disease Paternal Grandfather     Social History   Socioeconomic History   Marital status: Single    Spouse name: Not on file   Number of children: 0   Years of education: Not on file   Highest education level: Not on file  Occupational History   Occupation: disable  Tobacco Use   Smoking status: Former    Current packs/day: 0.00    Average packs/day: 0.3 packs/day for 10.0 years (2.5 ttl pk-yrs)    Types: Cigarettes    Start date: 06/29/2006    Quit date: 06/28/2016    Years since quitting: 7.3   Smokeless tobacco: Never   Tobacco comments:    varies, 0-0.5 pack per day  Vaping Use   Vaping status: Former   Devices: 1 year none since 2019  Substance and Sexual Activity    Alcohol use: Not Currently    Alcohol/week: 0.0 standard drinks of alcohol    Comment: rare   Drug use: No   Sexual activity: Not Currently    Birth control/protection: None  Other Topics Concern   Not on file  Social History Narrative   Pt lives in 1 story home with her father and brother   Some college   On disability   Social Drivers of Corporate investment banker Strain: Not on file  Food Insecurity: Not on file  Transportation Needs: Not on file  Physical Activity: Not on file  Stress: Not on file  Social Connections: Not on file  Intimate Partner Violence: Not At Risk (08/06/2022)   Received from [facility]    Abuse Screen    Feels Unsafe at Home or Work/School: no    Feels Threatened by Someone: no    Does Anyone Try to Keep You From Having Contact with Others or Doing Things Outside Your Home?: no    Physical Signs of Abuse Present: no    Review of Systems:  All other review of systems negative except as mentioned in the HPI.  Physical Exam: Vital signs BP 108/69   Pulse 85   Temp (!) 97.5 F (36.4 C) (Temporal)   Resp 11   Ht 5' 3 (1.6 m)   Wt 57.4 kg   SpO2 95%   BMI 22.42 kg/m   General:   Alert,  Well-developed, well-nourished, pleasant and cooperative in NAD Lungs:  Clear throughout to auscultation.   Heart:  Regular rate and rhythm; no murmurs, clicks, rubs,  or gallops. Abdomen:  Soft, nontender and nondistended. Normal bowel sounds.   Neuro/Psych:  Normal mood and affect. A and O x 3  Inocente Hausen, MD [facility]

## 2023-10-20 NOTE — Discharge Instructions (Signed)

## 2023-10-20 NOTE — Anesthesia Postprocedure Evaluation (Signed)
 Anesthesia Post Note  Patient: Valerie Friedman  Procedure(s) Performed: COLONOSCOPY EGD (ESOPHAGOGASTRODUODENOSCOPY)     Patient location during evaluation: PACU Anesthesia Type: MAC Level of consciousness: awake and alert Pain management: pain level controlled Vital Signs Assessment: post-procedure vital signs reviewed and stable Respiratory status: spontaneous breathing, nonlabored ventilation, respiratory function stable and patient connected to nasal cannula oxygen Cardiovascular status: stable and blood pressure returned to baseline Postop Assessment: no apparent nausea or vomiting Anesthetic complications: no   No notable events documented.  Last Vitals:  Vitals:   10/20/23 0925 10/20/23 0930  BP:    Pulse: 66 66  Resp: (!) 23 (!) 27  Temp:    SpO2: 100% 99%    Last Pain:  Vitals:   10/20/23 0930  TempSrc:   PainSc: 0-No pain                 Rome Ade

## 2023-10-20 NOTE — Anesthesia Procedure Notes (Signed)
 Procedure Name: MAC Date/Time: 10/20/2023 8:20 AM  Performed by: Nada Corean CROME, CRNAPre-anesthesia Checklist: Patient identified, Emergency Drugs available, Suction available, Timeout performed and Patient being monitored Patient Re-evaluated:Patient Re-evaluated prior to induction Oxygen Delivery Method: Simple face mask Preoxygenation: Pre-oxygenation with 100% oxygen Induction Type: IV induction Placement Confirmation: positive ETCO2 and breath sounds checked- equal and bilateral Dental Injury: Teeth and Oropharynx as per pre-operative assessment  Comments: POM mask used

## 2023-10-20 NOTE — Op Note (Addendum)
 [facility] Patient Name: Valerie Friedman Procedure Date: 10/20/2023 MRN: [id number] Attending MD: Inocente Hausen , MD, [id number] Date of Birth: July 30, 1975 CSN: [id number] Age: 48 Admit Type: Outpatient Procedure:                Upper GI endoscopy Indications:              Abdominal pain in the left lower quadrant,                            Vomiting, Weight loss Providers:                Inocente Hausen, MD, Hoy Penner, RN, Coye Bade, Technician Referring MD:              Medicines:                Monitored Anesthesia Care Complications:            No immediate complications. Estimated blood loss:                            Minimal. Estimated Blood Loss:     Estimated blood loss was minimal. Procedure:                Pre-Anesthesia Assessment:                           - Prior to the procedure, a History and Physical                            was performed, and patient medications and                            allergies were reviewed. The patient's tolerance of                            previous anesthesia was also reviewed. The risks                            and benefits of the procedure and the sedation                            options and risks were discussed with the patient.                            All questions were answered, and informed consent                            was obtained. Prior Anticoagulants: The patient has                            taken no anticoagulant or antiplatelet agents                            except for aspirin .  ASA Grade Assessment: II - A                            patient with mild systemic disease. After reviewing                            the risks and benefits, the patient was deemed in                            satisfactory condition to undergo the procedure.                           After obtaining informed consent, the endoscope was                            passed under  direct vision. Throughout the                            procedure, the patient's blood pressure, pulse, and                            oxygen saturations were monitored continuously. The                            [id number]-[id number] ([id number]) Olympus endoscope was introduced                            through the mouth, and advanced to the second part                            of duodenum. The upper GI endoscopy was                            accomplished without difficulty. The patient                            tolerated the procedure well. Scope In: Scope Out: Findings:      The examined esophagus was normal.      The gastric body, gastric antrum, cardia (on retroflexion) and gastric       fundus (on retroflexion) were normal. Biopsies were taken with a cold       forceps for Helicobacter pylori testing.      The duodenal bulb and second portion of the duodenum were normal.       Biopsies for histology were taken with a cold forceps for evaluation of       celiac disease. Impression:               - Normal esophagus.                           - Normal gastric body, antrum, cardia and gastric                            fundus. Biopsied.                           -  Normal duodenal bulb and second portion of the                            duodenum. Biopsied. Moderate Sedation:      Not Applicable - Patient had care per Anesthesia. Recommendation:           - Await pathology results.                           - Perform a colonoscopy today.                           - The findings and recommendations were discussed                            with the patient. Procedure Code(s):        --- Professional ---                           [phone], Esophagogastroduodenoscopy, flexible,                            transoral; with biopsy, single or multiple Diagnosis Code(s):        --- Professional ---                           R10.32, Left lower quadrant pain                           R11.10, Vomiting,  unspecified                           R63.4, Abnormal weight loss CPT copyright 2022 American Medical Association. All rights reserved. The codes documented in this report are preliminary and upon coder review may  be revised to meet current compliance requirements. Inocente Hausen, MD 10/20/2023 8:32:08 AM This report has been signed electronically. Number of Addenda: 0

## 2023-10-20 NOTE — Transfer of Care (Signed)
 Immediate Anesthesia Transfer of Care Note  Patient: Valerie Friedman  Procedure(s) Performed: COLONOSCOPY EGD (ESOPHAGOGASTRODUODENOSCOPY)  Patient Location: Endoscopy Unit  Anesthesia Type:MAC  Level of Consciousness: sedated and responds to stimulation  Airway & Oxygen Therapy: Patient Spontanous Breathing and Patient connected to face mask oxygen  Post-op Assessment: Report given to RN and Post -op Vital signs reviewed and stable  Post vital signs: Reviewed and stable  Last Vitals:  Vitals Value Taken Time  BP 96/52 10/20/23 08:57  Temp    Pulse 60 10/20/23 08:59  Resp 24 10/20/23 08:59  SpO2 100 % 10/20/23 08:59  Vitals shown include unfiled device data.  Last Pain:  Vitals:   10/20/23 0855  TempSrc:   PainSc: Asleep         Complications: No notable events documented.

## 2023-10-20 NOTE — Op Note (Addendum)
 [facility] Patient Name: Valerie Friedman Procedure Date: 10/20/2023 MRN: [id number] Attending MD: Inocente Hausen , MD, [id number] Date of Birth: 1975-03-09 CSN: [id number] Age: 48 Admit Type: Outpatient Procedure:                Colonoscopy Indications:              Screening for colorectal malignant neoplasm, This                            is the patient's first colonoscopy Providers:                Inocente Hausen, MD, Hoy Penner, RN, Coye Bade, Technician Referring MD:              Medicines:                Monitored Anesthesia Care Complications:            No immediate complications. Estimated blood loss:                            Minimal. Estimated Blood Loss:     Estimated blood loss was minimal. Procedure:                Pre-Anesthesia Assessment:                           - Prior to the procedure, a History and Physical                            was performed, and patient medications and                            allergies were reviewed. The patient's tolerance of                            previous anesthesia was also reviewed. The risks                            and benefits of the procedure and the sedation                            options and risks were discussed with the patient.                            All questions were answered, and informed consent                            was obtained. Prior Anticoagulants: The patient has                            taken no anticoagulant or antiplatelet agents                            except for aspirin . ASA  Grade Assessment: II - A                            patient with mild systemic disease. After reviewing                            the risks and benefits, the patient was deemed in                            satisfactory condition to undergo the procedure.                           After obtaining informed consent, the colonoscope                            was  passed under direct vision. Throughout the                            procedure, the patient's blood pressure, pulse, and                            oxygen saturations were monitored continuously. The                            [id number] ([id number]) olympus colonscope was                            introduced through the anus and advanced to the the                            cecum, identified by appendiceal orifice and                            ileocecal valve. The colonoscopy was performed                            without difficulty. The patient tolerated the                            procedure well. The quality of the bowel                            preparation was good. The ileocecal valve,                            appendiceal orifice, and rectum were photographed. Scope In: 8:35:38 AM Scope Out: 8:48:51 AM Scope Withdrawal Time: 0 hours 10 minutes 21 seconds  Total Procedure Duration: 0 hours 13 minutes 13 seconds  Findings:      The perianal and digital rectal examinations were normal. Pertinent       negatives include normal sphincter tone and no palpable rectal lesions.      Three sessile polyps were found in the recto-sigmoid colon. The polyps       were 4 to 5 mm in size. These polyps were  removed with a cold biopsy       forceps. Resection and retrieval were complete.      The retroflexed view of the distal rectum and anal verge was normal and       showed no anal or rectal abnormalities. Impression:               - Three 4 to 5 mm polyps at the recto-sigmoid                            colon, removed with a cold biopsy forceps. Resected                            and retrieved.                           - The distal rectum and anal verge are normal on                            retroflexion view. Moderate Sedation:      Not Applicable - Patient had care per Anesthesia. Recommendation:           - Discharge patient to home (ambulatory).                           -  Await pathology results.                           - Repeat colonoscopy for surveillance based on                            pathology results.                           - The findings and recommendations were discussed                            with the patient.                           - Patient has a contact number available for                            emergencies. The signs and symptoms of potential                            delayed complications were discussed with the                            patient. Return to normal activities tomorrow.                            Written discharge instructions were provided to the                            patient. Procedure Code(s):        --- Professional ---  54619, Colonoscopy, flexible; with biopsy, single                            or multiple Diagnosis Code(s):        --- Professional ---                           Z12.11, Encounter for screening for malignant                            neoplasm of colon CPT copyright 2022 American Medical Association. All rights reserved. The codes documented in this report are preliminary and upon coder review may  be revised to meet current compliance requirements. Inocente Hausen, MD 10/20/2023 8:53:13 AM This report has been signed electronically. Number of Addenda: 0

## 2023-10-21 LAB — SURGICAL PATHOLOGY

## 2023-10-22 ENCOUNTER — Telehealth: Payer: Self-pay | Admitting: Pediatrics

## 2023-10-22 ENCOUNTER — Encounter (HOSPITAL_COMMUNITY): Payer: Self-pay | Admitting: Pediatrics

## 2023-10-22 ENCOUNTER — Ambulatory Visit: Payer: Self-pay | Admitting: Pediatrics

## 2023-10-22 DIAGNOSIS — R1110 Vomiting, unspecified: Secondary | ICD-10-CM

## 2023-10-22 NOTE — Addendum Note (Signed)
 Addended by: MERCER CRISTINO SAILOR on: 10/22/2023 09:28 AM   Modules accepted: Orders

## 2023-10-22 NOTE — Telephone Encounter (Signed)
 I spoke on the telephone with Valerie Friedman today to review results from her upper endoscopy and colonoscopy.  No evidence of H. pylori infection, celiac disease.  Benign hyperplastic polyps found on colonoscopy.  Advise scheduling a gastric emptying scan to evaluate for delayed gastric emptying contributing to symptoms of recurrent vomiting.  She is agreeable to this.  My office will coordinate this for her.  Advised next screening colonoscopy in 10 years.

## 2023-10-27 ENCOUNTER — Ambulatory Visit: Admitting: Family Medicine

## 2023-10-28 ENCOUNTER — Ambulatory Visit: Admitting: Family Medicine

## 2023-10-28 ENCOUNTER — Encounter: Payer: Self-pay | Admitting: Family Medicine

## 2023-10-28 ENCOUNTER — Encounter (HOSPITAL_COMMUNITY)

## 2023-10-28 ENCOUNTER — Ambulatory Visit: Payer: Self-pay

## 2023-10-28 VITALS — BP 100/72 | HR 87 | Temp 98.2°F | Wt 126.6 lb

## 2023-10-28 DIAGNOSIS — M25512 Pain in left shoulder: Secondary | ICD-10-CM

## 2023-10-28 DIAGNOSIS — M25511 Pain in right shoulder: Secondary | ICD-10-CM | POA: Diagnosis not present

## 2023-10-28 MED ORDER — PREDNISONE 20 MG PO TABS: 20.0000 mg | ORAL_TABLET | Freq: Two times a day (BID) | ORAL | 0 refills | Status: DC

## 2023-10-28 NOTE — Telephone Encounter (Signed)
 FYI Only or Action Required?: FYI only for provider.  Patient was last seen in primary care on 10/16/2023 by Johnny Garnette LABOR, MD.  Called Nurse Triage reporting Shoulder Pain.  Symptoms began several weeks ago.  Interventions attempted: OTC medications: voltaran.  Symptoms are: gradually worsening.  Triage Disposition: See PCP When Office is Open (Within 3 Days)  Patient/caregiver understands and will follow disposition?: Yes       Copied from CRM [id number]. Topic: Clinical - Red Word Triage >> Oct 28, 2023 11:23 AM [name] wrote: [facility] that prompted transfer to Nurse Triage: Patient is having shoulder pain in both shoulders and is struggling with range of motion with her right shoulder. Reason for Disposition  [1] MODERATE pain (e.g., interferes with normal activities) AND [2] present > 3 days  Answer Assessment - Initial Assessment Questions Pt states she does not think it's tendonitis. She states that the pain has moved. It is on the side of the right shoulder now . She states when she moves her arm or lifts anything she can feel the pain go from her shoulder joint all the way to her elbow joint and can't raise her arm parallel to the ground.  Her left should is starting to hurt as well, similar to the right.     1. ONSET: When did the pain start?     Prior to the last time she saw Dr. Johnny 2. LOCATION: Where is the pain located?     Both shoulders 3. PAIN: How bad is the pain? (Scale 1-10; or mild, moderate, severe)     8-9 when she feels it  4. 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     unknown 5. CAUSE: What do you think is causing the shoulder pain?     unknown 6. OTHER SYMPTOMS: Do you have any other symptoms? (e.g., neck pain, swelling, rash, fever, numbness, weakness)     With movement  Protocols used: Shoulder Pain-A-AH

## 2023-10-28 NOTE — Progress Notes (Addendum)
 Valerie Friedman                                          MRN: [id number]   10/28/2023   The [facility] Quality Team Specialist reviewed this patient medical record for the purposes of chart review for care gap closure. The following were reviewed: abstraction for care gap closure-colorectal cancer screening. Chart reviewed for BCS and KED as well  12/22/2023- Abstracted GSD, no BCS, no uACR to close KED labs.   [facility] Quality Team

## 2023-10-28 NOTE — Progress Notes (Signed)
   Subjective:    Patient ID: Valerie Friedman, female    DOB: 01-10-1976, 48 y.o.   MRN: [id number]  HPI Here for 4 weeks of pain in both shoulders. It started in the right shoulder and now hse has it in both. She also describes stiffness in both shoulder. Using ice and Voltaren  gel with no relief. No recent trauma.    Review of Systems  Constitutional: Negative.   Respiratory: Negative.    Cardiovascular: Negative.   Musculoskeletal:  Positive for arthralgias.       Objective:   Physical Exam Constitutional:      General: She is not in acute distress.    Appearance: Normal appearance.  Cardiovascular:     Rate and Rhythm: Normal rate and regular rhythm.     Pulses: Normal pulses.     Heart sounds: Normal heart sounds.  Pulmonary:     Effort: Pulmonary effort is normal.     Breath sounds: Normal breath sounds.  Musculoskeletal:     Comments: Both shoulders are tender diffusely, and both have limited ROM due to pain   Neurological:     Mental Status: She is alert.           Assessment & Plan:  Bilateral shoulder pain, possibly due to PMR. Treat with Prednisone 20 mg BID for 5 days. Report back next week.  Garnette Olmsted, MD

## 2023-10-29 NOTE — Telephone Encounter (Signed)
 Pt was seen on 10/28/23 for this problem

## 2023-11-09 ENCOUNTER — Encounter: Payer: Self-pay | Admitting: Pediatrics

## 2023-11-09 NOTE — Telephone Encounter (Signed)
 Hospital colonoscopy recall has been updated to 5 years.

## 2023-11-10 ENCOUNTER — Encounter (HOSPITAL_COMMUNITY)
Admission: RE | Admit: 2023-11-10 | Discharge: 2023-11-10 | Disposition: A | Discharge status: Home / Self Care | Source: Ambulatory Visit | Attending: Pediatrics | Admitting: Pediatrics

## 2023-11-10 ENCOUNTER — Telehealth: Payer: Self-pay

## 2023-11-10 DIAGNOSIS — K30 Functional dyspepsia: Secondary | ICD-10-CM | POA: Diagnosis not present

## 2023-11-10 DIAGNOSIS — R1110 Vomiting, unspecified: Secondary | ICD-10-CM | POA: Diagnosis not present

## 2023-11-10 MED ORDER — ACETAMINOPHEN 500 MG PO TABS
1000.0000 mg | ORAL_TABLET | Freq: Once | ORAL | Status: AC
  Administered 2023-11-10: 1000 mg via ORAL
  Filled 2023-11-10: qty 2

## 2023-11-10 MED ORDER — TECHNETIUM TC 99M SULFUR COLLOID
1.8500 | Freq: Once | INTRAVENOUS | Status: AC | PRN
  Administered 2023-11-10: 1.85 via INTRAVENOUS

## 2023-11-10 NOTE — Telephone Encounter (Signed)
 Received call from Brittany at [facility] stating that the pt is there for her Gastric Emptying study and refusing to eat the egg stating that she will throw it back up. Brittany stated that they have an alternative using oatmeal but the results are not as accurate. Needs approval from MD.  Please review and advise.

## 2023-11-10 NOTE — Telephone Encounter (Signed)
 [facility] was made aware of Dr. Suzann recommendations.   [facility] stated that the pt was eating the egg and tolerating it thus far.

## 2023-11-12 ENCOUNTER — Ambulatory Visit: Payer: Self-pay | Admitting: Pediatrics

## 2023-11-12 ENCOUNTER — Telehealth: Payer: Self-pay

## 2023-11-12 NOTE — Telephone Encounter (Signed)
 Called & spoke with patient regarding GES results & recommendations. Patient declines any treatment options provided. Patient mentioned that she spoke with Dr. Albertus after her father's procedure & he took her information to review. I informed patient that I did not see any documentation and we will have to request a formal transfer of care to Dr. Albertus. Patient is aware that once transfer is complete she can be scheduled with Dr. Albertus if he accepts. Patient verbalized understand.

## 2023-11-12 NOTE — Telephone Encounter (Signed)
 I told the patient that I have full confidence in Dr. Suzann and feel that she should remain with Dr. Suzann. Due to my administrative duties I do not have any availability for new or existing [facility] patients at this time

## 2023-11-13 ENCOUNTER — Telehealth: Payer: Self-pay | Admitting: *Deleted

## 2023-11-13 NOTE — Telephone Encounter (Signed)
 Copied from CRM #[id number]. Topic: Referral - Request for Referral >> Nov 13, 2023  8:47 AM Franky GRADE wrote: Did the patient discuss referral with their provider in the last year? Yes (If No - schedule appointment) (If Yes - send message)  Appointment offered? Yes  Type of order/referral and detailed reason for visit: rheumatology  Preference of office, provider, location: Who Dr.Fry recommends  If referral order, have you been seen by this specialty before? No (If Yes, this issue or another issue? When? Where?  Can we respond through MyChart? No

## 2023-11-13 NOTE — Telephone Encounter (Signed)
 Called & spoke with patient and advised of information from Dr. Albertus. before I could complete my sentence about remaining with Dr. Suzann patient continuously stated No, No, I do not like her. I again informed patient that Dr. Albertus is not accepting any new/transfer patient's at this time due to other duties. I advised patient to review our website & determine which provider she would prefer to see & then give us  a call back and we can submit a transfer request. Patient stated Fine & then hung up.

## 2023-11-16 ENCOUNTER — Encounter: Payer: Self-pay | Admitting: Family Medicine

## 2023-11-16 ENCOUNTER — Ambulatory Visit: Admitting: Family Medicine

## 2023-11-16 VITALS — BP 100/62 | HR 77 | Temp 98.1°F | Wt 125.2 lb

## 2023-11-16 DIAGNOSIS — K3184 Gastroparesis: Secondary | ICD-10-CM | POA: Diagnosis not present

## 2023-11-16 DIAGNOSIS — G8929 Other chronic pain: Secondary | ICD-10-CM

## 2023-11-16 DIAGNOSIS — M25511 Pain in right shoulder: Secondary | ICD-10-CM | POA: Diagnosis not present

## 2023-11-16 DIAGNOSIS — M25512 Pain in left shoulder: Secondary | ICD-10-CM

## 2023-11-16 DIAGNOSIS — E1165 Type 2 diabetes mellitus with hyperglycemia: Secondary | ICD-10-CM

## 2023-11-16 DIAGNOSIS — R1115 Cyclical vomiting syndrome unrelated to migraine: Secondary | ICD-10-CM

## 2023-11-16 MED ORDER — CELECOXIB 100 MG PO CAPS: 100.0000 mg | ORAL_CAPSULE | Freq: Two times a day (BID) | ORAL | 5 refills | Status: DC

## 2023-11-16 MED ORDER — SEMAGLUTIDE (1 MG/DOSE) 4 MG/3ML ~~LOC~~ SOPN: 1.0000 mg | PEN_INJECTOR | SUBCUTANEOUS | 5 refills | Status: AC

## 2023-11-16 MED ORDER — GLIPIZIDE 5 MG PO TABS: 5.0000 mg | ORAL_TABLET | Freq: Two times a day (BID) | ORAL | 5 refills | Status: AC

## 2023-11-16 NOTE — Progress Notes (Signed)
   Subjective:    Patient ID: Valerie Friedman, female    DOB: 27-Aug-1975, 48 y.o.   MRN: [id number]  HPI Here for several issues. First she continues to struggle with chronic nausea and frequent vomiting. She has been working with Dr. Suzann for this, and she recently took a gastric emptying test. This demonstrated delayed gastric emptying and proved that she has some gastroparesis. Dr. Suzann offered to try her on some medications that could help with this, but Valerie Friedman refused saying she is tired of taking pills. It was also mentioned that her GLP-1 medication is likely contributing to this, although her diabetes could also be playing a role. When it was recommended that she stop the Ozempic , she refused because it has allowed her to lose a lot of weight and it has controlled her diabetes. Today she asks me to refer her to another GI specialist for a second opinion. The other issue is her bilateral shoulder pain. She never heard back from [facility] after we did a referral to them in June. We recently gave her a short course of Prednisone, and this was wuite effective at easing her pain. However it made her glucoses go very high for a week or so. She is currently taking Tylenol  and Ibuprofen with no relief.    Review of Systems  Constitutional: Negative.   Respiratory: Negative.    Cardiovascular: Negative.   Gastrointestinal:  Positive for nausea and vomiting. Negative for abdominal distention, abdominal pain, blood in stool, constipation and diarrhea.  Musculoskeletal:  Positive for arthralgias.       Objective:   Physical Exam Constitutional:      Appearance: Normal appearance.  Cardiovascular:     Rate and Rhythm: Normal rate and regular rhythm.     Pulses: Normal pulses.     Heart sounds: Normal heart sounds.  Pulmonary:     Effort: Pulmonary effort is normal.     Breath sounds: Normal breath sounds.  Neurological:     Mental Status: She is alert.            Assessment & Plan:  As far as her vomiting and gastroparesis, I also told her that the Ozempic  is undoubtedly part of the problem, and I advised her to stop it. We decided to compromise, and we reduced her dose of Ozempic  to 1 mg weekly. Per her request we referred her to [facility] for another opinion. Because the Ozempic  was also treating her diabetes, we will start her back on Glipizide  5 mg BID. For the shoulder pain, I gave her the phone number for [facility] so she can contact them about an appointment. In the meantime she will try Celebrex 100 mg BID for the pain.  Garnette Olmsted, MD

## 2023-11-18 ENCOUNTER — Telehealth: Payer: Self-pay

## 2023-11-18 NOTE — Telephone Encounter (Signed)
 Copied from CRM [id number]. Topic: Clinical - Medication Question >> Nov 16, 2023  3:01 PM Aisha D wrote: Reason for CRM: Pt stated that the pharmacy informed her that they didn't receive the request for the Semaglutide , 1 MG/DOSE, 4 MG/3ML SOPN and needs to have this re faxed ti the pharmacy.

## 2023-11-18 NOTE — Telephone Encounter (Unsigned)
 Copied from CRM #[id number]. Topic: Referral - Status >> Nov 18, 2023  1:34 PM Zy'onna H wrote: Reason for CRM: A representative on behalf of the patient called in from: [facility] to inform that Valerie Belvie [facility], Valerie Friedman, will not be able to accept this patient at this time for care.  [phone] PCP/PCP Team please advise

## 2023-11-19 NOTE — Telephone Encounter (Signed)
 Left pt a detailed message advised to call the office back

## 2023-11-19 NOTE — Telephone Encounter (Signed)
 Noted. I guess she will need to find someone else for this ([facility] maybe?)

## 2023-11-19 NOTE — Telephone Encounter (Unsigned)
 Copied from CRM #[id number]. Topic: Referral - Status >> Nov 19, 2023  2:39 PM Zebedee SAUNDERS wrote: Reason for CRM: Pt need referral #[id number] redirected to [facility].

## 2023-11-23 ENCOUNTER — Telehealth: Payer: Self-pay | Admitting: *Deleted

## 2023-11-23 ENCOUNTER — Encounter: Payer: Self-pay | Admitting: *Deleted

## 2023-11-23 NOTE — Telephone Encounter (Signed)
 Please advise on medication. Pt bottoms out often with both medications taking. Last Glipizide  was taken on Saturday and bottomed out around 3 AM and could not get sugars above 88 throughout the day. Did not take medication today but sugars were 104 with highest checked level 115.  Advised pt to possibly hold med if she felt that she would bottom her out for the night until we can get response from PCP. Advised that any med changes will have to come from PCP whether discontinuing or lower any doses.

## 2023-11-23 NOTE — Telephone Encounter (Unsigned)
 Copied from CRM #[id number]. Topic: General - Other >> Nov 23, 2023  8:12 AM Thersia BROCKS wrote: Reason for CRM: Patient called in regarding message for Nurse Inocente and Dr.Fry, patient stated she took  glipiZIDE  (GLUCOTROL ) 5 MG tablet at diner on Saturday night didn't take it for breakfast because sugar was low. Sugar bottom out in the middle of night drunk a coke, woke up was 54 went to 88, drunk orange juice and ate something stated it didn't go up no higher than 88 104 last night so she hasn't took the pill since Saturday  Feels like she isnt sure the glipiZIDE  (GLUCOTROL ) 5 MG tablet and the Semaglutide , 1 MG/DOSE, 4 MG/3ML SOPN will be working together  Has took the ozempic   but she hasn't took the glipizide  yesterday or today and only took one on dinner time on Saturday . Patient stated she wasn't sure what her sugar was before she took the pill on Saturday but after taking it her sugar bottom out   Would like a callback regarding what she should do from here   [phone]

## 2023-11-23 NOTE — Telephone Encounter (Signed)
 Let's just stop the Glipizide  for now and see what her glucoses do in the next 2 weeks

## 2023-11-25 NOTE — Telephone Encounter (Signed)
 Patient informed of the message below.

## 2023-11-26 ENCOUNTER — Telehealth: Payer: Self-pay

## 2023-11-26 NOTE — Telephone Encounter (Signed)
 Copied from CRM [id number]. Topic: Clinical - Medical Advice >> Nov 26, 2023  9:31 AM Burnard DEL wrote: Reason for CRM: Patient called in stating that she is having extreme pain in her right shoulder. Patient stated that she called in yesterday to ask provider about being prescribed something for the pain,however CMA called her about her diabetes. She was prescribed celecoxib (CELEBREX) 100 MG capsule for her shoulder and she said that it is not helping at all. She has been taking it every six hours four times a day and its not helping her. Patient is requesting a call from [facility].

## 2023-11-27 ENCOUNTER — Telehealth: Payer: Self-pay

## 2023-11-27 NOTE — Telephone Encounter (Signed)
 Pt message sent to  PCP for advise ?

## 2023-11-27 NOTE — Telephone Encounter (Signed)
Pt message sent to Dr Fry for advise 

## 2023-11-27 NOTE — Telephone Encounter (Signed)
 Copied from CRM [id number]. Topic: Clinical - Medication Question >> Nov 25, 2023 11:13 AM Valerie Friedman wrote: Reason for CRM: CELECOXIB 100MG  taking every 6 hrs and not helping and wants to know if she can take the 5-500mg  percecet instead because shes still in a lot of pain.  If theres is no answer please leave a message letting her know if she can take the medication. It is a private VM

## 2023-11-30 MED ORDER — OXYCODONE-ACETAMINOPHEN 5-325 MG PO TABS: 1.0000 | ORAL_TABLET | ORAL | 0 refills | Status: DC | PRN

## 2023-11-30 NOTE — Telephone Encounter (Signed)
 See mu note for 11-27-23

## 2023-11-30 NOTE — Telephone Encounter (Signed)
 Left detailed message for pt of Dr Johnny advise, pr Rx sent to her pharmacy.

## 2023-11-30 NOTE — Addendum Note (Signed)
 Addended by: JOHNNY SENIOR A on: 11/30/2023 08:16 AM   Modules accepted: Orders

## 2023-11-30 NOTE — Telephone Encounter (Signed)
 I sent in for #20 of Percocet 5-325

## 2023-12-01 NOTE — Telephone Encounter (Signed)
 Spoke with pt voiced understanding to pick up prescription from her pharmacy

## 2023-12-02 ENCOUNTER — Other Ambulatory Visit: Payer: Self-pay | Admitting: Family Medicine

## 2023-12-03 ENCOUNTER — Other Ambulatory Visit: Payer: Self-pay | Admitting: Family Medicine

## 2023-12-03 NOTE — Telephone Encounter (Signed)
 Copied from CRM #[id number]. Topic: Clinical - Medication Refill >> Dec 03, 2023 12:33 PM Ashley R wrote: Medication:  oxyCODONE -acetaminophen  (PERCOCET/ROXICET) 5-325 MG tablet   Calling to state  RX working to dull pain, not fully alleviate. Taking every 6 hours, not every 4 due to dizziness, will run out by Saturday.    Has the patient contacted their pharmacy? Yes, only 20 ordered.    This is the patient's preferred pharmacy:  [id number] [place], [place] - [address] AT [facility] [address] [facility] [address] [facility] [facility] [address] [place] [address] Phone: [phone] Fax: [phone]  Is this the correct pharmacy for this prescription? Yes If no, delete pharmacy and type the correct one.   Has the prescription been filled recently? Yes  Is the patient out of the medication? No, will be out on Saturday, only 2 left for that day.   Has the patient been seen for an appointment in the last year OR does the patient have an upcoming appointment? Yes  Can we respond through MyChart? No, callback  required [phone]  Agent: Please be advised that Rx refills may take up to 3 business days. We ask that you follow-up with your pharmacy.

## 2023-12-04 NOTE — Telephone Encounter (Signed)
 Rx already sent. Please close pt chart

## 2023-12-07 MED ORDER — OXYCODONE-ACETAMINOPHEN 5-325 MG PO TABS: 1.0000 | ORAL_TABLET | ORAL | 0 refills | Status: DC | PRN

## 2023-12-08 ENCOUNTER — Ambulatory Visit: Admitting: Family Medicine

## 2023-12-08 ENCOUNTER — Encounter: Payer: Self-pay | Admitting: Family Medicine

## 2023-12-08 ENCOUNTER — Ambulatory Visit: Payer: Self-pay

## 2023-12-08 VITALS — BP 98/60 | HR 96 | Temp 98.6°F | Wt 120.2 lb

## 2023-12-08 DIAGNOSIS — A084 Viral intestinal infection, unspecified: Secondary | ICD-10-CM | POA: Diagnosis not present

## 2023-12-08 MED ORDER — DIPHENOXYLATE-ATROPINE 2.5-0.025 MG PO TABS: 2.0000 | ORAL_TABLET | Freq: Four times a day (QID) | ORAL | 0 refills | Status: AC | PRN

## 2023-12-08 MED ORDER — ONDANSETRON HCL 8 MG PO TABS: 8.0000 mg | ORAL_TABLET | Freq: Four times a day (QID) | ORAL | 0 refills | Status: AC | PRN

## 2023-12-08 NOTE — Progress Notes (Signed)
   Subjective:    Patient ID: Valerie Friedman, female    DOB: [birth year], 48 y.o.   MRN: [id number]  HPI Here for 2 days of nausea and vomiting, abdominal cramps, and diarrhea. No fever. No recent travel. She is unable to keep much down orally. She has used Imodium with no relief.    Review of Systems  Constitutional: Negative.   Respiratory: Negative.    Cardiovascular: Negative.   Gastrointestinal:  Positive for abdominal distention, abdominal pain, diarrhea, nausea and vomiting. Negative for blood in stool and constipation.  Genitourinary: Negative.        Objective:   Physical Exam Constitutional:      Appearance: She is ill-appearing.  Cardiovascular:     Rate and Rhythm: Normal rate and regular rhythm.     Pulses: Normal pulses.     Heart sounds: Normal heart sounds.  Pulmonary:     Effort: Pulmonary effort is normal.     Breath sounds: Normal breath sounds.  Abdominal:     General: Bowel sounds are normal. There is no distension.     Tenderness: There is no right CVA tenderness, left CVA tenderness, guarding or rebound.     Comments: She is mildly tender diffusely   Neurological:     Mental Status: She is alert.           Assessment & Plan:  Viral enteritis. Treat with Zofran  and Lomotil  as needed. She will resume taking PO fluids as soon as she can. Recheck as needed.  Garnette Olmsted, MD

## 2023-12-08 NOTE — Telephone Encounter (Signed)
 FYI Only or Action Required?: FYI only for provider: appointment scheduled on 12/08/2023 at 2pm with patient's PCP Dr Valerie Friedman.  Patient was last seen in primary care on 11/16/2023 by Friedman Valerie LABOR, MD.  Called Nurse Triage reporting Diarrhea.  Symptoms began 2 days ago.  Interventions attempted: OTC medications: imodium, Rest, hydration, or home remedies, and Other: drinking ginger ale and Gatorade.  Symptoms are: unchanged.  Triage Disposition: See HCP Within 4 Hours (Or PCP Triage)  Patient/caregiver understands and will follow disposition?: Yes              Copied from CRM #[id number]. Topic: Clinical - Red Word Triage >> Dec 08, 2023  8:25 AM Logan F wrote: Red Word that prompted transfer to Nurse Triage: Think she has the flu and has been sick for two days. Experiencing nausea and vomiting, diarrhea, bodyaches, headaches, and stomach pains. Has not noticed a fever and denies SOB or chest discomfort. She says she is throwing up everything she eats. She says she tried eating toast but it came back up. She says she diarreah will not stop. She has tried taking Imoduim and it is not helping today. Reason for Disposition  [1] Constant abdominal pain AND [2] present > 2 hours  Answer Assessment - Initial Assessment Questions N/V/D Patient states vomiting has subsided due to not eating that much at this time Took 3 immodium last night and it helped a little bit so she can sleep last night Taken two immodium today Patient's father had a 24-hour similar episode with nausea, vomiting, diarrhea Patient states that her father has an appointment today with Dr Friedman at 2pm and he told her that he would give her that appointment time---father's appointment was changed to tomorrow 12/09/2023 with Dr Valerie Friedman and this patient is scheduled for today at 2pm 11/24 Patient tried ginger ale and Gatorade Patient states she has generalized abdomen pain as well all over, nausea Patient denies  any blood in her stools or any known fevers at this time  Patient is advised to call us  back if anything changes or with any further questions/concerns. Patient is advised that if anything worsens to go to the Emergency Room. Patient verbalized understanding.    1. DIARRHEA SEVERITY: How bad is the diarrhea? How many more stools have you had in the past 24 hours than normal?      ----- 2. ONSET: When did the diarrhea begin?      2 days ago 3. STOOL DESCRIPTION:  How loose or watery is the diarrhea? What is the stool color? Is there any blood or mucous in the stool?      4. VOMITING: Are you also vomiting? If Yes, ask: How many times in the past 24 hours?      Nothing else to vomit per patient 5. ABDOMEN PAIN: Are you having any abdomen pain? If Yes, ask: What does it feel like? (e.g., crampy, dull, intermittent, constant)      yeah 6. ABDOMEN PAIN SEVERITY: If present, ask: How bad is the pain?  (e.g., Scale 1-10; mild, moderate, or severe)     7-8 7. ORAL INTAKE: If vomiting, Have you been able to drink liquids? How much liquids have you had in the past 24 hours?     Pt states that she is trying  8. HYDRATION: Any signs of dehydration? (e.g., dry mouth [not just dry lips], too weak to stand, dizziness, new weight loss) When did you last urinate?  Patient states she feels dehydrated 10. ANTIBIOTIC USE: Are you taking antibiotics now or have you taken antibiotics in the past 2 months?       denies 11. OTHER SYMPTOMS: Do you have any other symptoms? (e.g., fever, blood in stool)       Vomiting nausea generalized abd pain 12. PREGNANCY: Is there any chance you are pregnant? When was your last menstrual period?       no  Protocols used: [facility]

## 2023-12-08 NOTE — Telephone Encounter (Signed)
 Pt was seen this afternoon with by Dr Johnny for this problem

## 2023-12-15 ENCOUNTER — Ambulatory Visit: Admitting: Orthopedic Surgery

## 2023-12-15 ENCOUNTER — Other Ambulatory Visit

## 2023-12-15 DIAGNOSIS — G8929 Other chronic pain: Secondary | ICD-10-CM | POA: Diagnosis not present

## 2023-12-15 DIAGNOSIS — M25511 Pain in right shoulder: Secondary | ICD-10-CM

## 2023-12-15 DIAGNOSIS — M75111 Incomplete rotator cuff tear or rupture of right shoulder, not specified as traumatic: Secondary | ICD-10-CM | POA: Diagnosis not present

## 2023-12-15 NOTE — Progress Notes (Signed)
 Valerie Friedman

## 2023-12-15 NOTE — Progress Notes (Signed)
 Office Visit Note   Patient: Valerie Friedman           Date of Birth: Aug 17, 1975           MRN: [id number] Visit Date: 12/15/2023              Requested by: Johnny Garnette LABOR, MD [address] [place],  [place] [address] PCP: Johnny Garnette LABOR, MD  Chief Complaint  Patient presents with   Right Shoulder - Pain      HPI: Discussed the use of AI scribe software for clinical note transcription with the patient, who gave verbal consent to proceed.  History of Present Illness Valerie Friedman is a 48 year old female with diabetes who presents with shoulder pain. She was referred by Dr. Johnny to a rheumatologist for further evaluation.  She has been experiencing shoulder pain for a couple of months, primarily associated with her rotator cuff. The pain limits her ability to raise her arm over her head and causes significant discomfort during certain movements.  She has tried various treatments including ice therapy and Voltaren  gel, but these have not provided relief. She completed two weeks of physical therapy without improvement. Due to her diabetes, she is unable to use cortisone injections as they cause her blood sugar levels to rise significantly. She currently manages her diabetes with glipizide .  Her brother had a similar issue with a frozen shoulder, which influenced her initial thoughts about her condition. She sought orthopedic consultation on her own, believing it to be more appropriate for her shoulder issue.     Assessment & Plan: Visit Diagnoses:  1. Chronic right shoulder pain     Plan: Assessment and Plan Assessment & Plan Right shoulder rotator cuff tendinopathy Chronic right shoulder pain with limited active flexion and abduction. Pain on Neer and Hawkins impingement tests. No adhesive capsulitis. Tenderness over joint and biceps tendon. Radiograph shows no superior migration of humeral head. Declined steroid injection due to diabetes. - Requested MRI of right  shoulder. - Follow-up with Dr. Addie post-MRI results.      Follow-Up Instructions: No follow-ups on file.   Ortho Exam  Patient is alert, oriented, no adenopathy, well-dressed, normal affect, normal respiratory effort. Physical Exam MUSCULOSKELETAL: Active flexion and abduction to 70 degrees. Pain with Neer and Hawkins impingement test. No adhesive capsulitis. Muscle resistance with attempted range of motion due to pain. Tenderness to palpation posteriorly over joint and anteriorly over biceps tendon.      Imaging: No results found. No images are attached to the encounter.  Labs: Lab Results  Component Value Date   HGBA1C 5.8 (A) 10/16/2023   HGBA1C 6.1 (A) 08/11/2023   HGBA1C 6.2 (A) 04/20/2023   ESRSEDRATE 27 (H) 09/23/2023   ESRSEDRATE 11 05/06/2023   ESRSEDRATE 40 (H) 06/22/2017   CRP <1.0 09/23/2023   CRP 0.5 06/22/2017   CRP 0.2 (L) 03/26/2016     Lab Results  Component Value Date   ALBUMIN 4.3 09/23/2023   ALBUMIN 4.4 04/20/2023   ALBUMIN 4.2 01/28/2021    Lab Results  Component Value Date   MG 2.3 06/22/2017   Lab Results  Component Value Date   VD25OH 23 (L) 04/21/2016   VD25OH 17.58 (L) 03/26/2016    No results found for: PREALBUMIN    Latest Ref Rng & Units 09/23/2023    3:08 PM 05/06/2023    4:10 PM 04/20/2023    2:30 PM  CBC EXTENDED  WBC 4.0 -  10.5 K/uL 8.3  8.5  6.6   RBC 3.87 - 5.11 Mil/uL 5.02  5.06  4.89   Hemoglobin 12.0 - 15.0 g/dL 84.8  84.7  85.1   HCT 36.0 - 46.0 % 45.7  46.9  45.7   Platelets 150.0 - 400.0 K/uL 185.0  212  240.0   NEUT# 1.4 - 7.7 K/uL 5.7  5.9  4.5   Lymph# 0.7 - 4.0 K/uL 2.0  2.1  1.7      There is no height or weight on file to calculate BMI.  Orders:  Orders Placed This Encounter  Procedures   XR Shoulder Right   No orders of the defined types were placed in this encounter.    Procedures: No procedures performed  Clinical Data: No additional findings.  ROS:  All other systems negative,  except as noted in the HPI. Review of Systems  Objective: Vital Signs: There were no vitals taken for this visit.  Specialty Comments:  No specialty comments available.  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   OSA (obstructive sleep apnea) 04/30/2017   Vitamin D  deficiency 04/16/2016   Foot drop, right 03/26/2016   Anxiety    Myotonic dystrophy (HCC)    Fibromyalgia    Migraine    WEIGHT LOSS 07/11/2008   HEADACHE 07/11/2008   NICOTINE ADDICTION 10/20/2007   MYOTONIC MUSCULAR DYSTROPHY 09/24/2007   MYALGIA/MYOSITIS NOS 08/03/2006   Depression 06/23/2006   Past Medical History:  Diagnosis Date   Anemia    Anxiety    Arthritis    Cataracts, bilateral    MD just watching   Chronic lower back pain DUE TO MYOTONIC DYSTROPHY   Complication of anesthesia    slow to awaken , respirations either slow or fast.   Depression    patient denies this dx,  Therapist said that patient that she was fine after 2 years- ~2000   Diabetes mellitus without complication (HCC)    Endometriosis    Family history of adverse reaction to anesthesia 2022   brother was discharged after surgery had to be  taken back to hospital  CO2 was elevated, he was in the hospital for 9 days.   GERD (gastroesophageal reflux disease)    diet controlled   History of right foot drop    Migraine    Myotonic dystrophy (HCC) DX JULY 2009  ----- TYPE 1   FOLLOWED BY DR. LOVE   Numbness of fingers BILATERAL  HANDS/  OCCASIONLLY RADIATES UP ARMS   Pelvic pain in female    Pneumonia    Seasonal allergies    Sleep apnea    does not use cpap   Vertigo    no current problems in the last year   Vitamin D  deficiency     Family History  Problem Relation Age of Onset   Diabetes Mother    Hypertension Father    Hyperlipidemia Father    Heart disease Father    Hypertension Brother    Diabetes Brother  Atrial fibrillation Brother    Colon cancer Maternal Uncle    Coronary artery disease Maternal Uncle    Cancer Paternal Uncle        Colon cancer   Parkinsonism Paternal Uncle    Heart disease Maternal Grandmother    Diabetes Maternal Grandfather    Heart disease Maternal Grandfather    Coronary artery disease Maternal Grandfather    Heart disease Paternal Grandmother    Stroke Paternal Grandmother    CAD Paternal Grandmother    Heart disease Paternal Grandfather     Past Surgical History:  Procedure Laterality Date   BENIGN BREAST BX   FEB  2011   BREAST BIOPSY Right 2011   Benign Stereo    COLONOSCOPY N/A 10/20/2023   Procedure: COLONOSCOPY;  Surgeon: Suzann Inocente HERO, MD;  Location: [facility];  Service: Gastroenterology;  Laterality: N/A;   ESOPHAGOGASTRODUODENOSCOPY N/A 10/20/2023   Procedure: EGD (ESOPHAGOGASTRODUODENOSCOPY);  Surgeon: Suzann Inocente HERO, MD;  Location: [facility];  Service: Gastroenterology;  Laterality: N/A;   LAPAROSCOPY  05/01/2011   Procedure: LAPAROSCOPY DIAGNOSTIC;  Surgeon: Toribio LITTIE Campanile, MD;  Location: [facility];  Service: Gynecology;  Laterality: N/A;  with bilateral chromopertubation, aspiration of functional left ovarian cyst, excision of two hydatid cysts   MUSCLE BIOPSY  02-25-2007   LEFT THIGH QUADRICEP BX X3  FOR MYOSITIS   OPEN REDUCTION INTERNAL FIXATION (ORIF) FOOT LISFRANC FRACTURE Left 09/05/2020   Procedure: OPEN REDUCTION INTERNAL FIXATION (ORIF) LEFT FOOT LISFRANC FRACTURE DISLOCATION;  Surgeon: Harden Jerona GAILS, MD;   Location: [facility];  Service: Orthopedics;  Laterality: Left;   SURG. FOR REMOVAL OF TEETH FRAGMENTS    MARCH 2012   ORAL SURGEON OFFICE   WISDOM TOOTH EXTRACTION     Social History   Occupational History   Occupation: disable  Tobacco Use   Smoking status: Former    Current packs/day: 0.00    Average packs/day: 0.3 packs/day for 10.0 years (2.5 ttl pk-yrs)    Types: Cigarettes    Start date: 06/29/2006    Quit date: 06/28/2016    Years since quitting: 7.4   Smokeless tobacco: Never   Tobacco comments:    varies, 0-0.5 pack per day  Vaping Use   Vaping status: Former   Devices: 1 year none since 2019  Substance and Sexual Activity   Alcohol use: Not Currently    Alcohol/week: 0.0 standard drinks of alcohol    Comment: rare   Drug use: No   Sexual activity: Not Currently    Birth control/protection: None

## 2023-12-18 ENCOUNTER — Other Ambulatory Visit: Payer: Self-pay | Admitting: Family Medicine

## 2023-12-18 NOTE — Telephone Encounter (Unsigned)
 Copied from CRM #[id number]. Topic: Clinical - Medication Refill >> Dec 18, 2023 11:27 AM Tinnie C wrote: Medication: oxyCODONE -acetaminophen  (PERCOCET/ROXICET) 5-325 MG tablet  Has the patient contacted their pharmacy? Yes Unable to refill, controlled substance.  This is the patient's preferred pharmacy:  [id number] [place], [place] - [address] AT [facility] [address] [facility] [address] [facility] [address] [place] [address] Phone: [phone] Fax: [phone]  Is this the correct pharmacy for this prescription? Yes If no, delete pharmacy and type the correct one.   Has the prescription been filled recently? Yes  Is the patient out of the medication? Will be out today.  Has the patient been seen for an appointment in the last year OR does the patient have an upcoming appointment? Yes, seen 11/25  Can we respond through MyChart? Please call [phone]  Agent: Please be advised that Rx refills may take up to 3 business days. We ask that you follow-up with your pharmacy.

## 2023-12-22 NOTE — Telephone Encounter (Signed)
 Pt  calling for status check on refill request. I informed her of our 1-3 day turnaround time and it has been 2 days. She states she took her last pill on Saturday morning and is out of meds and in a lot of pain. She is requesting Dr Johnny to please sign off on script today so she can get her meds, thank you

## 2023-12-23 ENCOUNTER — Ambulatory Visit: Payer: Self-pay

## 2023-12-23 ENCOUNTER — Telehealth: Payer: Self-pay

## 2023-12-23 ENCOUNTER — Other Ambulatory Visit: Payer: Self-pay

## 2023-12-23 NOTE — Telephone Encounter (Signed)
 FYI Only or Action Required?: Action required by provider: Requesting sign off on Percocet refill request.  Patient was last seen in primary care on 12/08/2023 by Valerie Garnette LABOR, MD.  Called Nurse Triage reporting Shoulder Pain.  Symptoms began on going.  Interventions attempted: OTC medications: Tylenol .  Symptoms are: stable.  Triage Disposition: Home Care  Patient/caregiver understands and will follow disposition?: Yes Reason for Disposition  Shoulder pain  Answer Assessment - Initial Assessment Questions Patient's reports its her rotator cuff, saw ortho last week got xrays done, set up for an MRI on 15th and it is likely patient will need surgery. Patient states PCP knows about this and states he would refill percocet for as long as it takes. Patient reports being out of it for a few days and tried managing with Tylenol  and its not working.   Patient requested refill on 12/5, she is aware of the 3 days turn around time for refills. Patient is calling to check in on refill as she is in severe pain. Please advise.  1. ONSET: When did the pain start?     On going  2. LOCATION: Where is the pain located?     Right shoulder   3. PAIN: How bad is the pain? (Scale 1-10; or mild, moderate, severe)     10/10  4. CAUSE: What do you think is causing the shoulder pain?     Rotator cuff, needs surgery  5. OTHER SYMPTOMS: Do you have any other symptoms? (e.g., neck pain, swelling, rash, fever, numbness, weakness)     Denies  Protocols used: Shoulder Pain-A-AH  Copied from CRM #[id number]. Topic: Clinical - Red Word Triage >> Dec 23, 2023 11:49 AM Jayma L wrote: Red Word that prompted transfer to Nurse Triage: patient called in stated she's having right side shoulder pain and the pain is getting worse each day

## 2023-12-23 NOTE — Telephone Encounter (Signed)
 Pend and send medication refill sent to provider.

## 2023-12-23 NOTE — Telephone Encounter (Signed)
 Copied from CRM #[id number]. Topic: Clinical - Medication Refill >> Dec 18, 2023 11:27 AM Tinnie C wrote: Medication: oxyCODONE -acetaminophen  (PERCOCET/ROXICET) 5-325 MG tablet  Has the patient contacted their pharmacy? Yes Unable to refill, controlled substance.  This is the patient's preferred pharmacy:  [id number] [place], [place] - [address] AT [facility] [address] [facility] [address] & [facility] [address] [place] [address] Phone: [phone] Fax: [phone]  Is this the correct pharmacy for this prescription? Yes If no, delete pharmacy and type the correct one.   Has the prescription been filled recently? Yes  Is the patient out of the medication? Will be out today.  Has the patient been seen for an appointment in the last year OR does the patient have an upcoming appointment? Yes, seen 11/25  Can we respond through MyChart? Please call [phone]  Agent: Please be advised that Rx refills may take up to 3 business days. We ask that you follow-up with your pharmacy. >> Dec 23, 2023  3:40 PM Larissa S wrote: Patient calling to check the status of refill. States she has been out of her medication for several days and is very upset that prescription has not been sent to her pharmacy. Patient requesting to have prescription sent as soon as possible.

## 2023-12-24 ENCOUNTER — Telehealth: Payer: Self-pay | Admitting: Orthopedic Surgery

## 2023-12-24 ENCOUNTER — Other Ambulatory Visit: Payer: Self-pay | Admitting: Orthopedic Surgery

## 2023-12-24 MED ORDER — OXYCODONE-ACETAMINOPHEN 5-325 MG PO TABS: 1.0000 | ORAL_TABLET | Freq: Two times a day (BID) | ORAL | 0 refills | Status: DC | PRN

## 2023-12-24 NOTE — Progress Notes (Signed)
 I have turned this over to her orthopedist

## 2023-12-24 NOTE — Telephone Encounter (Signed)
 The pt is pending an MRI scan of her shoulder on 12/28/2023 and follow up with Dr. Addie afterwards. Pt states that PCP who wrote rx for Oxycodone  5/325 #20 on 12/07/2023 advised that she needed to get her refills from our office now. The pt states that she takes this medication TID. Please see message below and advise.

## 2023-12-24 NOTE — Telephone Encounter (Signed)
 She was referred to Orthopedics, and she saw Dr. Harden on 12-15-23. She needs to ask him about pain medication

## 2023-12-24 NOTE — Telephone Encounter (Signed)
 Pt called saying that she called her Primary Care Dr to get her Oxycodone /Acetaminophen  refilled, but he said that she needed to call Dr. Harden because he is see her now. Pharmacy is [facility] on the corner of [address] and [address] [address]Pharmacy phone  number is [phone] Call back number is [phone]. Pt says that she doesn't need to take it as often as prescribed, she only take it 3 times a day.

## 2023-12-28 ENCOUNTER — Inpatient Hospital Stay
Admission: RE | Admit: 2023-12-28 | Discharge: 2023-12-28 | Attending: Orthopedic Surgery | Admitting: Orthopedic Surgery

## 2023-12-28 DIAGNOSIS — G8929 Other chronic pain: Secondary | ICD-10-CM

## 2023-12-28 DIAGNOSIS — M75111 Incomplete rotator cuff tear or rupture of right shoulder, not specified as traumatic: Secondary | ICD-10-CM

## 2023-12-29 NOTE — Telephone Encounter (Signed)
 Left pt a detailed message advised to call the office back

## 2023-12-30 ENCOUNTER — Telehealth: Payer: Self-pay | Admitting: Orthopedic Surgery

## 2023-12-30 ENCOUNTER — Other Ambulatory Visit: Payer: Self-pay | Admitting: Orthopedic Surgery

## 2023-12-30 MED ORDER — OXYCODONE-ACETAMINOPHEN 5-325 MG PO TABS: 1.0000 | ORAL_TABLET | Freq: Three times a day (TID) | ORAL | 0 refills | Status: DC | PRN

## 2023-12-30 NOTE — Telephone Encounter (Signed)
 Pt is s/p MRI shoulder 12/28/2023 and is sch to se Dr. Addie 01/20/2024. Please see message below. Pt states tht she is having pain and would like refill of her Oxycodone  and to be dispensed for three times a day not twice a day.

## 2023-12-30 NOTE — Telephone Encounter (Signed)
 Pt called requesting a refill of hydrocodone. Pt also requesting hydrocodone for 3 times a day instead of two. Pt states her pains are bad and need 3 times day script. Please send to [address] Pt number is [phone].

## 2024-01-04 NOTE — Telephone Encounter (Signed)
 Patients chart states that pt had appointment with Dr Harden Orthopedic.

## 2024-01-05 ENCOUNTER — Telehealth: Payer: Self-pay | Admitting: Family Medicine

## 2024-01-05 NOTE — Telephone Encounter (Signed)
 Copied from CRM #[id number]. Topic: Complaint (DO NOT CONVERT) - Provider (sensitive) >> Jan 05, 2024 11:50 AM Jayma L wrote: Date of Incident: was 3 weeks ago on a Friday before the 17th  patient said  Details of complaint: Dr. Johnny told patient to see a specialist , she was scheduled with a rhinologist appt 01/05/2024 . Dr still hasn't approved Percocet , she did get it approved by a different dr. She wants to know why he didn't approve the medicine  How would the patient like to see it resolved? She doesn't know  On a scale of 1-10, how was your experience? N/A What would it take to bring it to a 10? Doesn't know   Route to Research Officer, [employer].

## 2024-01-06 NOTE — Telephone Encounter (Signed)
 Attempted 3rd call to pt regarding Dr Johnny advise on pt request for pain medication left another detailed message with Dr Johnny recommendation on pt voicemail. Advised pt to call the office with any questions

## 2024-01-12 ENCOUNTER — Other Ambulatory Visit: Payer: Self-pay | Admitting: Orthopedic Surgery

## 2024-01-12 ENCOUNTER — Telehealth: Payer: Self-pay | Admitting: Orthopedic Surgery

## 2024-01-12 MED ORDER — OXYCODONE-ACETAMINOPHEN 5-325 MG PO TABS: 1.0000 | ORAL_TABLET | Freq: Three times a day (TID) | ORAL | 0 refills | Status: DC | PRN

## 2024-01-12 NOTE — Telephone Encounter (Signed)
 Patient called. She would like a refill on percocet.

## 2024-01-13 NOTE — Telephone Encounter (Signed)
 Attempts (4)calls made to pt no answers or return calls. Several Voicemail's left on pt mobile number advised to call the office back regarding this encounter.

## 2024-01-15 NOTE — Telephone Encounter (Signed)
 Left detailed message on vm okay per DPR of information below.

## 2024-01-20 ENCOUNTER — Ambulatory Visit: Admitting: Orthopedic Surgery

## 2024-01-20 DIAGNOSIS — M7501 Adhesive capsulitis of right shoulder: Secondary | ICD-10-CM

## 2024-01-20 NOTE — Progress Notes (Signed)
 "  Office Visit Note   Patient: Valerie Friedman           Date of Birth: Mar 03, 1975           MRN: [id number] Visit Date: 01/20/2024 Requested by: Johnny Garnette LABOR, MD [address] [place],  [place] [address] PCP: Johnny Garnette LABOR, MD  Subjective: Chief Complaint  Patient presents with   Right Shoulder - Pain    HPI: Valerie Friedman is a 49 y.o. female who presents to the office reporting right shoulder pain and stiffness of several months duration.  Been going on since October.  Denies any history of injury.  Reports decreased range of motion because of the pain.  She cannot have injections because of diabetes.  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  She does take Percocet for pain 3 times a day.  Also uses ice and Voltaren  cream without much help.  Celebrex  also is not helpful.  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  MRI scan is reviewed and it shows rotator cuff tendinosis as well as inferior capsular thickening consistent with adhesive capsulitis..                ROS: All systems reviewed are negative as they relate to the chief complaint within the history of present illness.  Patient denies fevers or chills.  Assessment & Plan: Visit Diagnoses:  1. Adhesive capsulitis of right shoulder     Plan: Impression is right shoulder adhesive capsulitis with fairly profound loss of motion.  Injections are not a great option for her because of her diabetes.  Plan at this time is right shoulder manipulation under anesthesia with postop CPM use.  Her brother has had this done and had a good result.  The risk and benefits are discussed with the patient including Anza to infection or vessel damage incomplete pain relief as well as incomplete functional restoration.  The importance of spending at least 4 to 6 hours a day the first week after shoulder manipulation is discussed.  Will also do arthroscopic rotator interval release to achieve better external rotation.  I think pain management after surgery  could be problematic based on her current dosing of Percocet.  We will post her for sometime in the near future.  All questions answered.  Follow-Up Instructions: No follow-ups on file.   Orders:  No orders of the defined types were placed in this encounter.  No orders of the defined types were placed in this encounter.     Procedures: No procedures performed   Clinical Data: No additional findings.  Objective: Vital Signs: There were no vitals taken for this visit.  Physical Exam:  Constitutional: Patient appears well-developed HEENT:  Head: Normocephalic Eyes:EOM are normal Neck: Normal range of motion Cardiovascular: Normal rate Pulmonary/chest: Effort normal Neurologic: Patient is alert Skin: Skin is warm Psychiatric: Patient has normal mood and affect  Ortho Exam: Ortho exam demonstrates passive range of motion of the right of 15/45/85 passive range of motion on the left is 90/120/20.  Has good rotator cuff strength infraspinatus supraspinatus and subscap muscle testing on the right.  No discrete AC joint tenderness right versus left.  Cervical spine range of motion intact.  Motor or sensory function in the right arm intact.  No other masses lymphadenopathy or skin changes noted in the right shoulder girdle region.  Specialty Comments:  No specialty comments available.  Imaging: No results found.   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   OSA (obstructive sleep  apnea) 04/30/2017   Vitamin D  deficiency 04/16/2016   Foot drop, right 03/26/2016   Anxiety    Myotonic dystrophy (HCC)    Fibromyalgia    Migraine    WEIGHT LOSS 07/11/2008   HEADACHE 07/11/2008   NICOTINE ADDICTION 10/20/2007   MYOTONIC MUSCULAR DYSTROPHY 09/24/2007   MYALGIA/MYOSITIS NOS 08/03/2006   Depression 06/23/2006   Past Medical History:  Diagnosis Date   Anemia    Anxiety    Arthritis    Cataracts, bilateral    MD just watching   Chronic lower back pain DUE TO MYOTONIC DYSTROPHY   Complication of anesthesia    slow to awaken , respirations either slow or fast.   Depression    patient denies this dx,  Therapist said that patient that she was fine after 2 years- ~2000   Diabetes mellitus without complication (HCC)    Endometriosis    Family history of adverse reaction to anesthesia 2022   brother was discharged after surgery had to be  taken back to hospital  CO2 was elevated, he was in the hospital for 9 days.   GERD (gastroesophageal reflux disease)    diet controlled   History of right foot drop    Migraine    Myotonic dystrophy (HCC) DX JULY 2009  ----- TYPE 1   FOLLOWED BY DR. LOVE   Numbness of fingers BILATERAL HANDS/  OCCASIONLLY RADIATES UP ARMS   Pelvic pain in female    Pneumonia    Seasonal allergies    Sleep apnea    does not use cpap   Vertigo    no current problems in the last year   Vitamin D  deficiency     Family History  Problem Relation Age of Onset   Diabetes Mother    Hypertension Father    Hyperlipidemia Father    Heart disease Father    Hypertension Brother    Diabetes Brother    Atrial fibrillation Brother    Colon cancer Maternal Uncle    Coronary artery disease Maternal Uncle    Cancer Paternal Uncle        Colon cancer   Parkinsonism Paternal Uncle    Heart disease Maternal Grandmother    Diabetes Maternal Grandfather    Heart disease Maternal Grandfather    Coronary artery disease Maternal Grandfather    Heart  disease Paternal Grandmother    Stroke Paternal Grandmother    CAD Paternal Grandmother    Heart disease Paternal Grandfather     Past Surgical History:  Procedure Laterality Date   BENIGN BREAST BX   FEB  2011   BREAST BIOPSY Right 2011   Benign Stereo    COLONOSCOPY N/A 10/20/2023   Procedure: COLONOSCOPY;  Surgeon: Suzann Inocente HERO, MD;  Location: [facility];  Service: Gastroenterology;  Laterality: N/A;   ESOPHAGOGASTRODUODENOSCOPY N/A 10/20/2023   Procedure: EGD (ESOPHAGOGASTRODUODENOSCOPY);  Surgeon: Suzann Inocente HERO, MD;  Location: [facility];  Service: Gastroenterology;  Laterality: N/A;   LAPAROSCOPY  05/01/2011   Procedure: LAPAROSCOPY DIAGNOSTIC;  Surgeon: Toribio LITTIE Campanile, MD;  Location: [facility];  Service: Gynecology;  Laterality: N/A;  with bilateral chromopertubation, aspiration of functional left ovarian cyst, excision of two hydatid cysts   MUSCLE BIOPSY  02-25-2007   LEFT THIGH QUADRICEP BX X3  FOR MYOSITIS   OPEN REDUCTION INTERNAL FIXATION (ORIF) FOOT LISFRANC FRACTURE Left 09/05/2020   Procedure: OPEN REDUCTION INTERNAL FIXATION (ORIF) LEFT FOOT LISFRANC FRACTURE DISLOCATION;  Surgeon: Harden Jerona GAILS, MD;  Location: [facility];  Service: Orthopedics;  Laterality: Left;   SURG. FOR REMOVAL OF TEETH FRAGMENTS    MARCH 2012   ORAL SURGEON OFFICE   WISDOM TOOTH EXTRACTION     Social History   Occupational History   Occupation: disable  Tobacco Use   Smoking status: Former    Current packs/day: 0.00    Average packs/day: 0.3 packs/day for 10.0 years (2.5 ttl pk-yrs)    Types: Cigarettes    Start date: 06/29/2006    Quit date: 06/28/2016    Years since quitting: 7.5   Smokeless tobacco: Never   Tobacco comments:    varies, 0-0.5 pack per day  Vaping Use   Vaping status: Former   Devices: 1 year none since 2019  Substance and Sexual Activity   Alcohol use: Not Currently    Alcohol/week: 0.0 standard drinks of alcohol    Comment: rare   Drug  use: No   Sexual activity: Not Currently    Birth control/protection: None        "

## 2024-01-21 ENCOUNTER — Encounter: Payer: Self-pay | Admitting: Orthopedic Surgery

## 2024-01-21 ENCOUNTER — Other Ambulatory Visit: Payer: Self-pay | Admitting: Orthopedic Surgery

## 2024-01-21 DIAGNOSIS — M7501 Adhesive capsulitis of right shoulder: Secondary | ICD-10-CM

## 2024-01-21 MED ORDER — OXYCODONE-ACETAMINOPHEN 5-325 MG PO TABS: 1.0000 | ORAL_TABLET | Freq: Three times a day (TID) | ORAL | 0 refills | Status: DC | PRN

## 2024-01-21 MED ORDER — CYCLOBENZAPRINE HCL 5 MG PO TABS: 5.0000 mg | ORAL_TABLET | Freq: Three times a day (TID) | ORAL | Status: DC | PRN

## 2024-02-01 ENCOUNTER — Telehealth: Payer: Self-pay | Admitting: Surgical

## 2024-02-01 NOTE — Telephone Encounter (Signed)
 Pt called asking for refill of oxycodone . Her surgery is coming and need meds until. Please send to [facility] and [place]. Pt number is [phone].

## 2024-02-02 ENCOUNTER — Telehealth: Payer: Self-pay | Admitting: Orthopedic Surgery

## 2024-02-02 ENCOUNTER — Other Ambulatory Visit: Payer: Self-pay | Admitting: Surgical

## 2024-02-02 MED ORDER — OXYCODONE-ACETAMINOPHEN 5-325 MG PO TABS: 1.0000 | ORAL_TABLET | Freq: Two times a day (BID) | ORAL | 0 refills | Status: DC | PRN

## 2024-02-02 NOTE — Telephone Encounter (Signed)
 Patient called. She would like some pain medication.

## 2024-02-02 NOTE — Telephone Encounter (Signed)
 I refilled oxycodone .  This prescription should last until surgery.  No refills until afterward.

## 2024-02-09 NOTE — Progress Notes (Incomplete)
 SDW CALL  Patient was given pre-op instructions over the phone. The opportunity was given for the patient to ask questions. No further questions asked. Patient verbalized understanding of instructions given.   PCP - Dr. Garnette Olmsted Cardiologist -   PPM/ICD -  Device Orders -  Rep Notified -   Chest x-ray -  EKG -  Stress Test -  ECHO -  Cardiac Cath -   Sleep Study - +OSA CPAP - no  Fasting Blood Sugar -  Checks Blood Sugar _____ times a day  Blood Thinner Instructions: Aspirin  Instructions:  ERAS Protcol -clear liquids until 0800 PRE-SURGERY Ensure or G2-   COVID TEST- na   Anesthesia review:   Patient denies shortness of breath, fever, cough and chest pain over the phone call  Special instructions:    Oral Hygiene is also important to reduce your risk of infection.  Remember - BRUSH YOUR TEETH THE MORNING OF SURGERY WITH YOUR REGULAR TOOTHPASTE

## 2024-02-10 ENCOUNTER — Encounter (HOSPITAL_COMMUNITY): Payer: Self-pay | Admitting: Orthopedic Surgery

## 2024-02-10 ENCOUNTER — Other Ambulatory Visit: Payer: Self-pay

## 2024-02-10 NOTE — Anesthesia Preprocedure Evaluation (Signed)
 "                                  Anesthesia Evaluation  Patient identified by MRN, date of birth, ID band Patient awake    Reviewed: Allergy & Precautions, NPO status , Patient's Chart, lab work & pertinent test results  History of Anesthesia Complications (+) PROLONGED EMERGENCE, Family history of anesthesia reaction and history of anesthetic complications (brother with hypercarbia afterwards)  Airway Mallampati: III  TM Distance: >3 FB Neck ROM: Full   Comment: Previous grade I view with MAC 3, easy mask Dental  (+) Dental Advisory Given,    Pulmonary neg shortness of breath, sleep apnea (does not use CPAP) , neg COPD, neg recent URI, former smoker   Pulmonary exam normal breath sounds clear to auscultation       Cardiovascular (-) hypertension(-) angina (-) Past MI, (-) Cardiac Stents and (-) CABG negative cardio ROS (-) dysrhythmias  Rhythm:Regular Rate:Normal  TTE 07/11/2020: IMPRESSIONS    1. Limited echo with bubble study.   2. Left ventricular ejection fraction, by estimation, is 65 to 70%. The  left ventricle has normal function. The left ventricle has no regional  wall motion abnormalities. Left ventricular diastolic parameters are  consistent with Grade I diastolic  dysfunction (impaired relaxation).   3. No obvious VSD- no evidence for negative contrast when the RV was  opacified with saline microbubbles   4. Agitated saline contrast bubble study was negative, with no evidence  of any interatrial shunt at the atrial level.     Neuro/Psych  Headaches, neg Seizures PSYCHIATRIC DISORDERS Anxiety Depression    Vertigo   Neuromuscular disease (numbness of fingers of bilateral hands that sometimes radiates up arms, h/o right foot drop, myotonic dystrophy)    GI/Hepatic Neg liver ROS,GERD  ,,Gastroparesis    Endo/Other  diabetes, Type 2, Oral Hypoglycemic Agents    Renal/GU negative Renal ROS     Musculoskeletal  (+) Arthritis ,  Fibromyalgia  -  Abdominal   Peds  Hematology  (+) Blood dyscrasia, anemia Lab Results      Component                Value               Date                      WBC                      8.3                 09/23/2023                HGB                      15.1 (H)            09/23/2023                HCT                      45.7                09/23/2023                MCV  91.2                09/23/2023                PLT                      185.0               09/23/2023              Anesthesia Other Findings Last semaglutide : 6-8 weeks ago  On day 5 or 6 of ketoconazole  cream for fungal infection in right armpit. Surgeon says okay to proceed.  Reproductive/Obstetrics Endometriosis                               Anesthesia Physical Anesthesia Plan  ASA: 2  Anesthesia Plan: General   Post-op Pain Management: Regional block* and Tylenol  PO (pre-op)*   Induction: Intravenous  PONV Risk Score and Plan: 3 and Ondansetron , Dexamethasone , Midazolam  and Treatment may vary due to age or medical condition  Airway Management Planned: Oral ETT  Additional Equipment:   Intra-op Plan:   Post-operative Plan: Extubation in OR  Informed Consent: I have reviewed the patients History and Physical, chart, labs and discussed the procedure including the risks, benefits and alternatives for the proposed anesthesia with the patient or authorized representative who has indicated his/her understanding and acceptance.     Dental advisory given  Plan Discussed with: CRNA and Anesthesiologist  Anesthesia Plan Comments: (Discussed potential risks of nerve blocks including, but not limited to, infection, bleeding, nerve damage, seizures, pneumothorax, respiratory depression, and potential failure of the block. Alternatives to nerve blocks discussed. All questions answered.  Risks of general anesthesia discussed including, but not limited to, sore throat,  hoarse voice, chipped/damaged teeth, injury to vocal cords, nausea and vomiting, allergic reactions, lung infection, heart attack, stroke, and death. All questions answered. )         Anesthesia Quick Evaluation  "

## 2024-02-10 NOTE — Progress Notes (Signed)
 SDW call  Patient was given pre-op instructions over the phone. Patient verbalized understanding of instructions provided.  Denied any SOB, fever or cough   PCP - Dr. Garnette Olmsted Cardiologist - Dr. Wilbert Bihari Pulmonary:    PPM/ICD - denies Device Orders - na Rep Notified - na   Chest x-ray -  EKG -  DOS, 02/11/2024 Stress Test - ECHO - 07/11/2020 Cardiac Cath -   Sleep Study/sleep apnea/CPAP: OSA, patient denies  Type II diabetic. A1C 5.8 on 10/16/2023 Fasting Blood sugar range: 150-200 How often check sugars: daily Glipizide , instructed to hold night before surgery. Patient states she is not going to follow this instructions and is going to take her Glipizide    Blood Thinner Instructions: denies Aspirin  Instructions:denies   ERAS Protcol - [place] 0800  Anesthesia review: No  Your procedure is scheduled on Thursday February 11, 2024  Report to [facility] Main Entrance A at  0830  A.M., then check in with the Admitting office.  Call this number if you have problems the morning of surgery:  [phone]   If you have any questions prior to your surgery date call [phone]: Open Monday-Friday 8am-4pm If you experience any cold or flu symptoms such as cough, fever, chills, shortness of breath, etc. between now and your scheduled surgery, please notify us  at the above number    Remember:  Do not eat after midnight the night before your surgery  You may drink clear liquids until 0800  the morning of your surgery.   Clear liquids allowed are: Water , Non-Citrus Juices (without pulp), Carbonated Beverages, Clear Tea, Black Coffee ONLY (NO MILK, CREAM OR POWDERED CREAMER of any kind), and Gatorade   Take these medicines the morning of surgery with A SIP OF WATER :  Tyleno, percocet  As needed: None  As of today, STOP taking any Aspirin  (unless otherwise instructed by your surgeon) Aleve, Naproxen, Ibuprofen, Motrin, Advil, Goody's, BC's, all herbal medications, fish oil,  and all vitamins.

## 2024-02-11 ENCOUNTER — Encounter (HOSPITAL_COMMUNITY)
Admission: RE | Disposition: A | Discharge status: Home / Self Care | Payer: Self-pay | Source: Home / Self Care | Attending: Orthopedic Surgery

## 2024-02-11 ENCOUNTER — Encounter (HOSPITAL_COMMUNITY): Admitting: Anesthesiology

## 2024-02-11 ENCOUNTER — Telehealth: Payer: Self-pay

## 2024-02-11 ENCOUNTER — Ambulatory Visit (HOSPITAL_COMMUNITY)
Admission: RE | Admit: 2024-02-11 | Discharge: 2024-02-11 | Disposition: A | Discharge status: Home / Self Care | Attending: Orthopedic Surgery | Admitting: Orthopedic Surgery

## 2024-02-11 ENCOUNTER — Encounter (HOSPITAL_COMMUNITY): Payer: Self-pay | Admitting: Orthopedic Surgery

## 2024-02-11 DIAGNOSIS — G7111 Myotonic muscular dystrophy: Secondary | ICD-10-CM | POA: Insufficient documentation

## 2024-02-11 DIAGNOSIS — M7501 Adhesive capsulitis of right shoulder: Secondary | ICD-10-CM

## 2024-02-11 DIAGNOSIS — K3184 Gastroparesis: Secondary | ICD-10-CM | POA: Diagnosis not present

## 2024-02-11 DIAGNOSIS — M1990 Unspecified osteoarthritis, unspecified site: Secondary | ICD-10-CM | POA: Insufficient documentation

## 2024-02-11 DIAGNOSIS — Z87891 Personal history of nicotine dependence: Secondary | ICD-10-CM | POA: Diagnosis not present

## 2024-02-11 DIAGNOSIS — Z7984 Long term (current) use of oral hypoglycemic drugs: Secondary | ICD-10-CM | POA: Insufficient documentation

## 2024-02-11 DIAGNOSIS — M797 Fibromyalgia: Secondary | ICD-10-CM | POA: Insufficient documentation

## 2024-02-11 DIAGNOSIS — K219 Gastro-esophageal reflux disease without esophagitis: Secondary | ICD-10-CM | POA: Diagnosis not present

## 2024-02-11 DIAGNOSIS — E1143 Type 2 diabetes mellitus with diabetic autonomic (poly)neuropathy: Secondary | ICD-10-CM | POA: Diagnosis not present

## 2024-02-11 DIAGNOSIS — Z01818 Encounter for other preprocedural examination: Secondary | ICD-10-CM

## 2024-02-11 DIAGNOSIS — G4730 Sleep apnea, unspecified: Secondary | ICD-10-CM | POA: Diagnosis not present

## 2024-02-11 LAB — CBC
HCT: 41.4 % (ref 36.0–46.0)
Hemoglobin: 13.8 g/dL (ref 12.0–15.0)
MCH: 31.4 pg (ref 26.0–34.0)
MCHC: 33.3 g/dL (ref 30.0–36.0)
MCV: 94.3 fL (ref 80.0–100.0)
Platelets: 218 10*3/uL (ref 150–400)
RBC: 4.39 MIL/uL (ref 3.87–5.11)
RDW: 13 % (ref 11.5–15.5)
WBC: 7.6 10*3/uL (ref 4.0–10.5)
nRBC: 0 % (ref 0.0–0.2)

## 2024-02-11 LAB — POCT PREGNANCY, URINE: Preg Test, Ur: NEGATIVE

## 2024-02-11 LAB — GLUCOSE, CAPILLARY
Glucose-Capillary: 162 mg/dL — ABNORMAL HIGH (ref 70–99)
Glucose-Capillary: 155 mg/dL — ABNORMAL HIGH (ref 70–99)

## 2024-02-11 MED ORDER — FENTANYL CITRATE (PF) 100 MCG/2ML IJ SOLN
INTRAMUSCULAR | Status: AC
  Filled 2024-02-11: qty 2

## 2024-02-11 MED ORDER — ORAL CARE MOUTH RINSE: 15.0000 mL | Freq: Once | OROMUCOSAL | Status: AC

## 2024-02-11 MED ORDER — EPINEPHRINE PF 1 MG/ML IJ SOLN
INTRAMUSCULAR | Status: AC
  Filled 2024-02-11: qty 2

## 2024-02-11 MED ORDER — ACETAMINOPHEN 500 MG PO TABS
1000.0000 mg | ORAL_TABLET | Freq: Once | ORAL | Status: DC
  Filled 2024-02-11: qty 2

## 2024-02-11 MED ORDER — BUPIVACAINE HCL (PF) 0.5 % IJ SOLN
INTRAMUSCULAR | Status: DC | PRN
  Administered 2024-02-11: 10 mL via PERINEURAL

## 2024-02-11 MED ORDER — TRANEXAMIC ACID-NACL 1000-0.7 MG/100ML-% IV SOLN
1000.0000 mg | INTRAVENOUS | Status: AC
  Administered 2024-02-11: 1000 mg via INTRAVENOUS
  Filled 2024-02-11: qty 100

## 2024-02-11 MED ORDER — LIDOCAINE 2% (20 MG/ML) 5 ML SYRINGE
INTRAMUSCULAR | Status: DC | PRN
  Administered 2024-02-11: 60 mg via INTRAVENOUS

## 2024-02-11 MED ORDER — POVIDONE-IODINE 10 % EX SWAB
2.0000 | Freq: Once | CUTANEOUS | Status: AC
  Administered 2024-02-11: 2 via TOPICAL

## 2024-02-11 MED ORDER — DEXAMETHASONE SOD PHOSPHATE PF 10 MG/ML IJ SOLN
INTRAMUSCULAR | Status: DC | PRN
  Administered 2024-02-11: 5 mg via INTRAVENOUS

## 2024-02-11 MED ORDER — OXYCODONE HCL 5 MG PO TABS
5.0000 mg | ORAL_TABLET | Freq: Once | ORAL | Status: AC | PRN
  Administered 2024-02-11: 5 mg via ORAL

## 2024-02-11 MED ORDER — BUPIVACAINE LIPOSOME 1.3 % IJ SUSP
INTRAMUSCULAR | Status: DC | PRN
  Administered 2024-02-11: 10 mL via PERINEURAL

## 2024-02-11 MED ORDER — INSULIN ASPART 100 UNIT/ML IJ SOLN
0.0000 [IU] | INTRAMUSCULAR | Status: DC | PRN
  Filled 2024-02-11: qty 0.14

## 2024-02-11 MED ORDER — FENTANYL CITRATE (PF) 100 MCG/2ML IJ SOLN: 50.0000 ug | Freq: Once | INTRAMUSCULAR | Status: AC

## 2024-02-11 MED ORDER — SUGAMMADEX SODIUM 200 MG/2ML IV SOLN
INTRAVENOUS | Status: DC | PRN
  Administered 2024-02-11: 100 mg via INTRAVENOUS
  Administered 2024-02-11: 200 mg via INTRAVENOUS

## 2024-02-11 MED ORDER — LACTATED RINGERS IV SOLN: INTRAVENOUS | Status: DC | PRN

## 2024-02-11 MED ORDER — OXYCODONE HCL 5 MG/5ML PO SOLN: 5.0000 mg | Freq: Once | ORAL | Status: AC | PRN

## 2024-02-11 MED ORDER — STERILE WATER FOR IRRIGATION IR SOLN
Status: DC | PRN
  Administered 2024-02-11: 1000 mL

## 2024-02-11 MED ORDER — CEFAZOLIN SODIUM-DEXTROSE 2-4 GM/100ML-% IV SOLN
2.0000 g | INTRAVENOUS | Status: AC
  Administered 2024-02-11: 2 g via INTRAVENOUS
  Filled 2024-02-11: qty 100

## 2024-02-11 MED ORDER — FENTANYL CITRATE (PF) 100 MCG/2ML IJ SOLN
INTRAMUSCULAR | Status: AC
  Administered 2024-02-11: 50 ug via INTRAVENOUS
  Filled 2024-02-11: qty 2

## 2024-02-11 MED ORDER — DIPHENHYDRAMINE HCL 50 MG/ML IJ SOLN
INTRAMUSCULAR | Status: DC | PRN
  Administered 2024-02-11: 12.5 mg via INTRAVENOUS

## 2024-02-11 MED ORDER — DIPHENHYDRAMINE HCL 50 MG/ML IJ SOLN
INTRAMUSCULAR | Status: AC
  Filled 2024-02-11: qty 1

## 2024-02-11 MED ORDER — PROPOFOL 10 MG/ML IV BOLUS
INTRAVENOUS | Status: DC | PRN
  Administered 2024-02-11: 150 mg via INTRAVENOUS

## 2024-02-11 MED ORDER — OXYCODONE-ACETAMINOPHEN 10-325 MG PO TABS: 1.0000 | ORAL_TABLET | Freq: Three times a day (TID) | ORAL | 0 refills | Status: DC | PRN

## 2024-02-11 MED ORDER — ROCURONIUM BROMIDE 10 MG/ML (PF) SYRINGE
PREFILLED_SYRINGE | INTRAVENOUS | Status: DC | PRN
  Administered 2024-02-11: 50 mg via INTRAVENOUS

## 2024-02-11 MED ORDER — AMISULPRIDE (ANTIEMETIC) 5 MG/2ML IV SOLN: 10.0000 mg | Freq: Once | INTRAVENOUS | Status: DC | PRN

## 2024-02-11 MED ORDER — PHENYLEPHRINE HCL-NACL 20-0.9 MG/250ML-% IV SOLN
INTRAVENOUS | Status: DC | PRN
  Administered 2024-02-11: 35 ug/min via INTRAVENOUS

## 2024-02-11 MED ORDER — CHLORHEXIDINE GLUCONATE 0.12 % MT SOLN
15.0000 mL | Freq: Once | OROMUCOSAL | Status: AC
  Administered 2024-02-11: 15 mL via OROMUCOSAL
  Filled 2024-02-11: qty 15

## 2024-02-11 MED ORDER — OXYCODONE HCL 5 MG PO TABS
ORAL_TABLET | ORAL | Status: AC
  Filled 2024-02-11: qty 1

## 2024-02-11 MED ORDER — MIDAZOLAM HCL (PF) 2 MG/2ML IJ SOLN: 2.0000 mg | Freq: Once | INTRAMUSCULAR | Status: AC

## 2024-02-11 MED ORDER — FENTANYL CITRATE (PF) 250 MCG/5ML IJ SOLN
INTRAMUSCULAR | Status: DC | PRN
  Administered 2024-02-11 (×2): 50 ug via INTRAVENOUS

## 2024-02-11 MED ORDER — LACTATED RINGERS IV SOLN: INTRAVENOUS | Status: DC

## 2024-02-11 MED ORDER — FENTANYL CITRATE (PF) 100 MCG/2ML IJ SOLN
25.0000 ug | INTRAMUSCULAR | Status: DC | PRN
  Administered 2024-02-11 (×2): 50 ug via INTRAVENOUS

## 2024-02-11 MED ORDER — INSULIN ASPART 100 UNIT/ML IJ SOLN
INTRAMUSCULAR | Status: AC
  Administered 2024-02-11: 2 [IU] via SUBCUTANEOUS
  Filled 2024-02-11: qty 2

## 2024-02-11 MED ORDER — POVIDONE-IODINE 7.5 % EX SOLN: Freq: Once | CUTANEOUS | Status: DC

## 2024-02-11 MED ORDER — 0.9 % SODIUM CHLORIDE (POUR BTL) OPTIME
TOPICAL | Status: DC | PRN
  Administered 2024-02-11: 1000 mL

## 2024-02-11 MED ORDER — MIDAZOLAM HCL 2 MG/2ML IJ SOLN
INTRAMUSCULAR | Status: AC
  Administered 2024-02-11: 2 mg via INTRAVENOUS
  Filled 2024-02-11: qty 2

## 2024-02-11 MED ORDER — SODIUM CHLORIDE 0.9 % IR SOLN
Status: DC | PRN
  Administered 2024-02-11: 3000 mL

## 2024-02-11 MED ORDER — ONDANSETRON HCL 4 MG/2ML IJ SOLN
INTRAMUSCULAR | Status: DC | PRN
  Administered 2024-02-11: 4 mg via INTRAVENOUS

## 2024-02-11 NOTE — Progress Notes (Signed)
 Patient stated she has a yeast infection under right operative arm. Informed Dr. Addie and sent him a picture of it. He stated that it shouldn't be a problem for the surgery and to proceed with preop. He stated that he would look at it in person as well. Patient informed and voiced understanding. Anesthesia made aware as well.

## 2024-02-11 NOTE — Transfer of Care (Signed)
 Immediate Anesthesia Transfer of Care Note  Patient: Valerie Friedman  Procedure(s) Performed: ARTHROSCOPY, SHOULDER (Right: Shoulder)  Patient Location: PACU  Anesthesia Type:General  Level of Consciousness: drowsy and patient cooperative  Airway & Oxygen Therapy: Patient Spontanous Breathing and Patient connected to face mask oxygen  Post-op Assessment: Report given to RN, Post -op Vital signs reviewed and stable, Patient moving all extremities, and Patient moving all extremities X 4  Post vital signs: Reviewed and stable  Last Vitals:  Vitals Value Taken Time  BP 175/80 02/11/24 12:30  Temp    Pulse 80 02/11/24 12:33  Resp 17 02/11/24 12:33  SpO2 96 % 02/11/24 12:33  Vitals shown include unfiled device data.  Last Pain:  Vitals:   02/11/24 1040  TempSrc:   PainSc: 5       Patients Stated Pain Goal: 1 (02/11/24 1040)  Complications: No notable events documented.

## 2024-02-11 NOTE — Anesthesia Procedure Notes (Signed)
 Anesthesia Regional Block: Interscalene brachial plexus block   Pre-Anesthetic Checklist: , timeout performed,  Correct Patient, Correct Site, Correct Laterality,  Correct Procedure, Correct Position, site marked,  Risks and benefits discussed,  Surgical consent,  Pre-op evaluation,  At surgeon's request and post-op pain management  Laterality: Right  Prep: chloraprep       Needles:  Injection technique: Single-shot  Needle Type: Echogenic Stimulator Needle     Needle Length: 9cm  Needle Gauge: 21     Additional Needles:   Procedures:,,,, ultrasound used (permanent image in chart),,    Narrative:  Start time: 02/11/2024 10:42 AM End time: 02/11/2024 10:46 AM Injection made incrementally with aspirations every 5 mL.  Performed by: Personally  Anesthesiologist: Peggye Delon Brunswick, MD  Additional Notes: No sign of infection at block site.  Discussed risks and benefits of nerve block including, but not limited to, prolonged and/or permanent nerve injury involving sensory and/or motor function. Monitors were applied and a time-out was performed. The nerve and associated structures were visualized under ultrasound guidance. After negative aspiration, local anesthetic was slowly injected around the nerve. There was no evidence of high pressure during the procedure. There were no paresthesias. VSS remained stable and the patient tolerated the procedure well.

## 2024-02-11 NOTE — Telephone Encounter (Signed)
 Pt called stating that her dressing was causing a rash on her operative arm due to the dressings. I spoke with Dr. Addie and advised the pt to take the dressing off, replace with band aids and we will see her tomorrow 02/12/24 at 8:00 am to redress and take a look at the site.

## 2024-02-11 NOTE — H&P (Addendum)
 KESLYN TEATER is an 49 y.o. female.   Chief Complaint: Right shoulder stiffness HPI: ALEXYSS BALZARINI is a 49 y.o. female who presents  reporting right shoulder pain and stiffness of several months duration.  Been going on since October.  Denies any history of injury.  Reports decreased range of motion because of the pain.  She cannot have injections because of diabetes.  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  She does take Percocet for pain 3 times a day.  Also uses ice and Voltaren  cream without much help.  Celebrex  also is not helpful.  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  MRI scan is reviewed and it shows rotator cuff tendinosis as well as inferior capsular thickening consistent with adhesive capsulitis..    Past Medical History:  Diagnosis Date   Anemia    Anxiety    Arthritis    Cataracts, bilateral    MD just watching   Chronic lower back pain DUE TO MYOTONIC DYSTROPHY   Complication of anesthesia    slow to awaken , respirations either slow or fast.   Depression    patient denies this dx,  Therapist said that patient that she was fine after 2 years- ~2000   Diabetes mellitus without complication (HCC)    Endometriosis    Family history of adverse reaction to anesthesia 2022   brother was discharged after surgery had to be  taken back to hospital  CO2 was elevated, he was in the hospital for 9 days.   GERD (gastroesophageal reflux disease)    diet controlled   History of right foot drop    Migraine    Myotonic dystrophy (HCC) DX JULY 2009  ----- TYPE 1   FOLLOWED BY DR. LOVE   Numbness of fingers BILATERAL HANDS/  OCCASIONLLY RADIATES UP ARMS   Pelvic pain in female    Pneumonia    Seasonal allergies    Sleep apnea    does not use cpap   Vertigo    no current problems in the last year   Vitamin D  deficiency     Past Surgical History:  Procedure Laterality Date   BENIGN BREAST BX   FEB  2011   BREAST BIOPSY Right 2011   Benign Stereo    COLONOSCOPY N/A 10/20/2023    Procedure: COLONOSCOPY;  Surgeon: Suzann Inocente CHRISTELLA, MD;  Location: [facility];  Service: Gastroenterology;  Laterality: N/A;   ESOPHAGOGASTRODUODENOSCOPY N/A 10/20/2023   Procedure: EGD (ESOPHAGOGASTRODUODENOSCOPY);  Surgeon: Suzann Inocente CHRISTELLA, MD;  Location: [facility];  Service: Gastroenterology;  Laterality: N/A;   LAPAROSCOPY  05/01/2011   Procedure: LAPAROSCOPY DIAGNOSTIC;  Surgeon: Toribio LITTIE Campanile, MD;  Location: [facility];  Service: Gynecology;  Laterality: N/A;  with bilateral chromopertubation, aspiration of functional left ovarian cyst, excision of two hydatid cysts   MUSCLE BIOPSY  02-25-2007   LEFT THIGH QUADRICEP BX X3  FOR MYOSITIS   OPEN REDUCTION INTERNAL FIXATION (ORIF) FOOT LISFRANC FRACTURE Left 09/05/2020   Procedure: OPEN REDUCTION INTERNAL FIXATION (ORIF) LEFT FOOT LISFRANC FRACTURE DISLOCATION;  Surgeon: Harden Jerona GAILS, MD;  Location: [facility];  Service: Orthopedics;  Laterality: Left;   SURG. FOR REMOVAL OF TEETH FRAGMENTS    MARCH 2012   ORAL SURGEON OFFICE   WISDOM TOOTH EXTRACTION      Family History  Problem Relation Age of Onset   Diabetes Mother    Hypertension Father    Hyperlipidemia Father    Heart disease Father  Hypertension Brother    Diabetes Brother    Atrial fibrillation Brother    Colon cancer Maternal Uncle    Coronary artery disease Maternal Uncle    Cancer Paternal Uncle        Colon cancer   Parkinsonism Paternal Uncle    Heart disease Maternal Grandmother    Diabetes Maternal Grandfather    Heart disease Maternal Grandfather    Coronary artery disease Maternal Grandfather    Heart disease Paternal Grandmother    Stroke Paternal Grandmother    CAD Paternal Grandmother    Heart disease Paternal Grandfather    Social History:  reports that she quit smoking about 7 years ago. Her smoking use included cigarettes. She started smoking about 17 years ago. She has a 2.5 pack-year smoking history. She has never used smokeless  tobacco. She reports current alcohol use. She reports current drug use.  Allergies: Allergies[1]  Facility-Administered Medications Prior to Admission  Medication Dose Route Frequency Provider Last Rate Last Admin   cyclobenzaprine  (FLEXERIL ) tablet 5 mg  5 mg Oral TID PRN        Medications Prior to Admission  Medication Sig Dispense Refill   acetaminophen  (TYLENOL ) 500 MG tablet Take 1,000 mg by mouth 3 (three) times daily.     cetirizine (ZYRTEC) 5 MG chewable tablet Chew 5 mg by mouth at bedtime.     Coal Tar Extract (PSORIASIN DEEP MOISTURIZING) 2 % OINT Apply 1 application  topically 2 (two) times daily.     glipiZIDE  (GLUCOTROL ) 5 MG tablet Take 1 tablet (5 mg total) by mouth 2 (two) times daily before a meal. (Patient taking differently: Take 5 mg by mouth at bedtime.) 60 tablet 5   oxyCODONE -acetaminophen  (PERCOCET/ROXICET) 5-325 MG tablet Take 1 tablet by mouth every 12 (twelve) hours as needed for severe pain (pain score 7-10). (Patient taking differently: Take 1 tablet by mouth 3 (three) times daily.) 20 tablet 0   diphenoxylate -atropine  (LOMOTIL ) 2.5-0.025 MG tablet Take 2 tablets by mouth 4 (four) times daily as needed for diarrhea or loose stools. (Patient not taking: Reported on 02/03/2024) 60 tablet 0   ketoconazole  (NIZORAL ) 2 % cream APPLY TOPICALLY TO THE AFFECTED AREA TWICE DAILY AS NEEDED FOR IRRITATION (Patient not taking: Reported on 02/03/2024) 30 g 2   ondansetron  (ZOFRAN ) 8 MG tablet Take 1 tablet (8 mg total) by mouth every 6 (six) hours as needed for nausea or vomiting. (Patient not taking: Reported on 02/03/2024) 60 tablet 0   Semaglutide , 1 MG/DOSE, 4 MG/3ML SOPN Inject 1 mg as directed once a week. (Patient not taking: Reported on 02/03/2024) 3 mL 5    Results for orders placed or performed during the hospital encounter of 02/11/24 (from the past 48 hours)  Glucose, capillary     Status: Abnormal   Collection Time: 02/11/24  9:03 AM  Result Value Ref Range    Glucose-Capillary 162 (H) 70 - 99 mg/dL    Comment: Glucose reference range applies only to samples taken after fasting for at least 8 hours.   Comment 1 Notify RN    Comment 2 Document in Chart   CBC     Status: None   Collection Time: 02/11/24  9:42 AM  Result Value Ref Range   WBC 7.6 4.0 - 10.5 K/uL   RBC 4.39 3.87 - 5.11 MIL/uL   Hemoglobin 13.8 12.0 - 15.0 g/dL   HCT 58.5 63.9 - 53.9 %   MCV 94.3 80.0 - 100.0 fL  MCH 31.4 26.0 - 34.0 pg   MCHC 33.3 30.0 - 36.0 g/dL   RDW 86.9 88.4 - 84.4 %   Platelets 218 150 - 400 K/uL   nRBC 0.0 0.0 - 0.2 %    Comment: Performed at [facility], [address], [place], [place] [address]  Pregnancy, urine POC     Status: None   Collection Time: 02/11/24  9:56 AM  Result Value Ref Range   Preg Test, Ur NEGATIVE NEGATIVE    Comment:        THE SENSITIVITY OF THIS METHODOLOGY IS >20 mIU/mL.    No results found.  Review of Systems  Musculoskeletal:  Positive for arthralgias.  All other systems reviewed and are negative.   Blood pressure 138/81, pulse 88, temperature 98.2 F (36.8 C), temperature source Oral, resp. rate 18, height 5' 2.5 (1.588 m), weight 58.1 kg, SpO2 96%. Physical Exam Vitals reviewed.  HENT:     Head: Normocephalic.     Nose: Nose normal.     Mouth/Throat:     Mouth: Mucous membranes are moist.  Eyes:     Pupils: Pupils are equal, round, and reactive to light.  Cardiovascular:     Rate and Rhythm: Normal rate.     Pulses: Normal pulses.  Pulmonary:     Effort: Pulmonary effort is normal.  Abdominal:     General: Abdomen is flat.  Musculoskeletal:     Cervical back: Normal range of motion.  Skin:    General: Skin is warm.     Capillary Refill: Capillary refill takes less than 2 seconds.  Neurological:     General: No focal deficit present.     Mental Status: She is alert.  Psychiatric:        Mood and Affect: Mood normal.    Ortho exam demonstrates passive range of motion of the right of  15/45/85 passive range of motion on the left is 90/120/20. Has good rotator cuff strength infraspinatus supraspinatus and subscap muscle testing on the right. No discrete AC joint tenderness right versus left. Cervical spine range of motion intact. Motor or sensory function in the right arm intact. No other masses lymphadenopathy or skin changes noted in the right shoulder girdle region.  4 x 4 cm rash present on the axillary region.  No induration fluctuance.  Looks to be fungal in nature likely from not being able to raise the arm up.  This will not affect the manipulation portion of the case.  Unlikely to affect the 2 portal incision for arthroscopy and rotator interval release.  Assessment/Plan mpression is right shoulder adhesive capsulitis with fairly profound loss of motion. Injections are not a great option for her because of her diabetes. Plan at this time is right shoulder manipulation under anesthesia with postop CPM use. Her brother has had this done and had a good result. The risk and benefits are discussed with the patient including Anza to infection or vessel damage incomplete pain relief as well as incomplete functional restoration. The importance of spending at least 4 to 6 hours a day the first week after shoulder manipulation is discussed. Will also do arthroscopic rotator interval release to achieve better external rotation. I think pain management after surgery could be problematic based on her current dosing of Percocet.   KANDICE Glendia Hutchinson, MD 02/11/2024, 10:28 AM       [1]  Allergies Allergen Reactions   Latex Hives   Aleve [Naproxen Sodium] Other (See Comments)  GETS THE CHILLS   Ciprocin-Fluocin-Procin [Fluocinolone Acetonide] Other (See Comments)    GI UPSET AND HEADACHE   Macrobid [Nitrofurantoin Monohydrate Macrocrystals] Other (See Comments)    GI UPSET; headache   Tramadol  Hcl     Pt had a Hx of dependency on this medication    Trazodone And Nefazodone Other (See  Comments)    Extreme sedation    Yellow Dyes 6, 10, And 11 Nausea And Vomiting    YELLOW DYE #5     Other Rash    KY JELLY

## 2024-02-11 NOTE — Anesthesia Procedure Notes (Signed)
 Procedure Name: Intubation Date/Time: 02/11/2024 11:23 AM  Performed by: Arvell Edsel HERO, CRNAPre-anesthesia Checklist: Patient identified, Emergency Drugs available, Suction available, Patient being monitored and Timeout performed Patient Re-evaluated:Patient Re-evaluated prior to induction Oxygen Delivery Method: Circle system utilized Preoxygenation: Pre-oxygenation with 100% oxygen Induction Type: IV induction Ventilation: Oral airway inserted - appropriate to patient size and Mask ventilation without difficulty Laryngoscope Size: Mac and 3 Grade View: Grade I Tube type: Oral Tube size: 7.0 mm Number of attempts: 1 Airway Equipment and Method: Patient positioned with wedge pillow and Stylet Placement Confirmation: ETT inserted through vocal cords under direct vision, positive ETCO2 and breath sounds checked- equal and bilateral Secured at: 22 cm Tube secured with: Tape Dental Injury: Teeth and Oropharynx as per pre-operative assessment

## 2024-02-11 NOTE — Anesthesia Postprocedure Evaluation (Signed)
"   Anesthesia Post Note  Patient: Valerie Friedman  Procedure(s) Performed: ARTHROSCOPY, SHOULDER (Right: Shoulder)     Patient location during evaluation: PACU Anesthesia Type: General and Regional Level of consciousness: awake Pain management: pain level controlled Vital Signs Assessment: post-procedure vital signs reviewed and stable Respiratory status: spontaneous breathing, nonlabored ventilation and respiratory function stable Cardiovascular status: blood pressure returned to baseline and stable Postop Assessment: no apparent nausea or vomiting Anesthetic complications: no   No notable events documented.  Last Vitals:  Vitals:   02/11/24 1330 02/11/24 1345  BP: (!) 142/91 (!) 146/62  Pulse: 81 87  Resp: 16 10  Temp:  36.9 C  SpO2: 94% 94%    Last Pain:  Vitals:   02/11/24 1333  TempSrc:   PainSc: 3                  [name]      "

## 2024-02-11 NOTE — Brief Op Note (Signed)
" ° °  02/11/2024  12:02 PM  PATIENT:  Valerie Friedman  49 y.o. female  PRE-OPERATIVE DIAGNOSIS:  right frozen shoulder  POST-OPERATIVE DIAGNOSIS:  right frozen shoulder  PROCEDURE:  Procedures: ARTHROSCOPY, SHOULDER with rotator interval release and manipulation under anesthesia  SURGEON:  Surgeon(s): Addie, Cordella Hamilton, MD  ASSISTANT: Herlene Calix, PA  ANESTHESIA:   General  EBL: 2 ml    Total I/O In: 200 [IV Piggyback:200] Out: -   BLOOD ADMINISTERED: none  DRAINS: None  LOCAL MEDICATIONS USED:  none  SPECIMEN:  No Specimen  COUNTS:  YES  TOURNIQUET:  * No tourniquets in log *  DICTATION: .Other Dictation: Dictation Number [id number]  PLAN OF CARE: Discharge to home after PACU  PATIENT DISPOSITION:  PACU - hemodynamically stable              "

## 2024-02-11 NOTE — Op Note (Signed)
 NAMEANACAREN, KOHAN MEDICAL RECORD NO: [id number] ACCOUNT NO: [id number] DATE OF BIRTH: 03-Jun-1975 FACILITY: [facility] LOCATION: [facility] PHYSICIAN: Cordella RAMAN. Addie, MD  Operative Report   DATE OF PROCEDURE: 02/11/2024  PREOPERATIVE DIAGNOSIS:  Right frozen shoulder.  POSTOPERATIVE DIAGNOSIS:  Right frozen shoulder.  PROCEDURE:  Right shoulder manipulation under anesthesia with arthroscopic rotator interval release.  SURGEON:  Cordella RAMAN. Addie, MD.   ASSISTANT:  Herlene Calix, PA.   INDICATIONS:  The patient is a 49 year old patient with right shoulder pain, who reports failure of conservative management.  DESCRIPTION OF PROCEDURE:  The patient was brought to the operating room where general anesthetic was induced.  Preoperative antibiotics were administered.  A timeout was called.  The patient's right arm was examined under anesthesia and found to have a  range of motion of 20/60/85.  The arm was then manipulated into 170 degrees of forward flexion with audible tearing of tissue.  It was then manipulated into 110 degrees of abduction, keeping the lever arm very short.  External rotation improved to about  50 degrees.  We then placed her in the beach chair position with her head in a neutral position.  The right shoulder, arm, and hand were prescrubbed with alcohol and Betadine , allowed to air dry, prepped with DuraPrep solution, and draped in a sterile  manner.  Ioban was used to seal the operative field.  Posterior portal was created.  An anterior portal was created under direct visualization.  A diagnostic arthroscopy was performed.  Significant synovitis was present within the shoulder itself.  The  glenohumeral articular surfaces were intact.  The rotator cuff was intact.  The biceps tendon was also intact along with the superior labrum.  The rotator interval was then released, and external rotation improved to about 75 degrees.  At this time, the  joint was thoroughly irrigated.  A  trocar was placed into the subacromial space, and adhesions were released.  The portals were then closed using 3-0 Vicryl and 3-0 nylon with impervious dressings applied.  A shoulder sling was applied.  The patient  tolerated the procedure well without immediate complications.  The postoperative plan is to discontinue the sling tomorrow and start the CPM machine tonight for 2 hours and then 6 hours a day for the first week.    Luke's assistance was required for opening, closing, mobilization of tissue, limb mobilization, and positioning.  His assistance was of medical necessity.   CHR D: 02/11/2024 12:06:49 pm T: 02/11/2024 1:08:00 pm  JOB: [id number]/ [id number]

## 2024-02-12 ENCOUNTER — Telehealth: Payer: Self-pay | Admitting: Radiology

## 2024-02-12 ENCOUNTER — Encounter (HOSPITAL_COMMUNITY): Payer: Self-pay | Admitting: Orthopedic Surgery

## 2024-02-12 ENCOUNTER — Ambulatory Visit (INDEPENDENT_AMBULATORY_CARE_PROVIDER_SITE_OTHER): Admitting: Orthopedic Surgery

## 2024-02-12 DIAGNOSIS — M7501 Adhesive capsulitis of right shoulder: Secondary | ICD-10-CM

## 2024-02-12 NOTE — Progress Notes (Signed)
 "  Post-Op Visit Note   Patient: Valerie Friedman           Date of Birth: 1975-08-01           MRN: [id number] Visit Date: 02/12/2024 PCP: Johnny Garnette LABOR, MD   Assessment & Plan:  Chief Complaint:  Chief Complaint  Patient presents with   Right Shoulder - Dressing Change   Visit Diagnoses: No diagnosis found.  Plan: Patient presents today because she is having a reaction to her dressing.  Our CMA remove the dressing and applied paper tape and gauze.  No dressing should be required after Monday.  We did give her some larger Band-Aid type dressings to use which may have a different adhesive than the Tegaderms.  Follow-Up Instructions: No follow-ups on file.   Orders:  No orders of the defined types were placed in this encounter.  No orders of the defined types were placed in this encounter.   Imaging: No results found.  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   OSA (obstructive sleep apnea) 04/30/2017   Vitamin D  deficiency 04/16/2016   Foot drop, right 03/26/2016   Anxiety    Myotonic dystrophy (HCC)    Fibromyalgia    Migraine    WEIGHT LOSS 07/11/2008   HEADACHE 07/11/2008   NICOTINE ADDICTION 10/20/2007   MYOTONIC MUSCULAR DYSTROPHY 09/24/2007   MYALGIA/MYOSITIS NOS 08/03/2006   Depression 06/23/2006   Past Medical History:  Diagnosis Date   Anemia    Anxiety    Arthritis    Cataracts, bilateral    MD just watching   Chronic lower back pain DUE TO  MYOTONIC DYSTROPHY   Complication of anesthesia    slow to awaken , respirations either slow or fast.   Depression    patient denies this dx,  Therapist said that patient that she was fine after 2 years- ~2000   Diabetes mellitus without complication (HCC)    Endometriosis    Family history of adverse reaction to anesthesia 2022   brother was discharged after surgery had to be  taken back to hospital  CO2 was elevated, he was in the hospital for 9 days.   GERD (gastroesophageal reflux disease)    diet controlled   History of right foot drop    Migraine    Myotonic dystrophy (HCC) DX JULY 2009  ----- TYPE 1   FOLLOWED BY DR. LOVE   Numbness of fingers BILATERAL HANDS/  OCCASIONLLY RADIATES UP ARMS   Pelvic pain in female    Pneumonia    Seasonal allergies    Sleep apnea    does not use cpap   Vertigo    no current problems in the last year   Vitamin D  deficiency     Family History  Problem Relation Age of Onset   Diabetes Mother    Hypertension Father    Hyperlipidemia Father    Heart disease Father    Hypertension Brother    Diabetes Brother    Atrial  fibrillation Brother    Colon cancer Maternal Uncle    Coronary artery disease Maternal Uncle    Cancer Paternal Uncle        Colon cancer   Parkinsonism Paternal Uncle    Heart disease Maternal Grandmother    Diabetes Maternal Grandfather    Heart disease Maternal Grandfather    Coronary artery disease Maternal Grandfather    Heart disease Paternal Grandmother    Stroke Paternal Grandmother    CAD Paternal Grandmother    Heart disease Paternal Grandfather     Past Surgical History:  Procedure Laterality Date   BENIGN BREAST BX   FEB  2011   BREAST BIOPSY Right 2011   Benign Stereo    COLONOSCOPY N/A 10/20/2023   Procedure: COLONOSCOPY;  Surgeon: Suzann Inocente HERO, MD;  Location: [facility];  Service: Gastroenterology;  Laterality: N/A;   ESOPHAGOGASTRODUODENOSCOPY N/A 10/20/2023   Procedure: EGD  (ESOPHAGOGASTRODUODENOSCOPY);  Surgeon: Suzann Inocente HERO, MD;  Location: [facility];  Service: Gastroenterology;  Laterality: N/A;   LAPAROSCOPY  05/01/2011   Procedure: LAPAROSCOPY DIAGNOSTIC;  Surgeon: Toribio LITTIE Campanile, MD;  Location: [facility];  Service: Gynecology;  Laterality: N/A;  with bilateral chromopertubation, aspiration of functional left ovarian cyst, excision of two hydatid cysts   MUSCLE BIOPSY  02-25-2007   LEFT THIGH QUADRICEP BX X3  FOR MYOSITIS   OPEN REDUCTION INTERNAL FIXATION (ORIF) FOOT LISFRANC FRACTURE Left 09/05/2020   Procedure: OPEN REDUCTION INTERNAL FIXATION (ORIF) LEFT FOOT LISFRANC FRACTURE DISLOCATION;  Surgeon: Harden Jerona GAILS, MD;  Location: [facility];  Service: Orthopedics;  Laterality: Left;   SHOULDER ARTHROSCOPY Right 02/11/2024   Procedure: ARTHROSCOPY, SHOULDER;  Surgeon: Addie Cordella Hamilton, MD;  [facility];  Service: Orthopedics;  Laterality: Right;  RIGHT SHOULDER MANIPULATION UNDER ANESTHESIA, ARTHROSCOPY ROTATOR INTERVAL RELEASE   SURG. FOR REMOVAL OF TEETH FRAGMENTS    MARCH 2012   ORAL SURGEON OFFICE   WISDOM TOOTH EXTRACTION     Social History   Occupational History   Occupation: disable  Tobacco Use   Smoking status: Former    Current packs/day: 0.00    Average packs/day: 0.3 packs/day for 10.0 years (2.5 ttl pk-yrs)    Types: Cigarettes    Start date: 06/29/2006    Quit date: 06/28/2016    Years since quitting: 7.6   Smokeless tobacco: Never   Tobacco comments:    varies, 0-0.5 pack per day  Vaping Use   Vaping status: Former   Devices: 1 year none since 2019  Substance and Sexual Activity   Alcohol use: Yes    Comment: rare   Drug use: Yes    Comment: CBD oil QID   Sexual activity: Not Currently    Birth control/protection: None     "

## 2024-02-12 NOTE — Telephone Encounter (Signed)
 Patient came in this morning for wound check/dressing change.  Had shoulder surgery yesterday with Dr. Addie. Stated the had a reaction to the tegaderm dressing that was applied after surgery, area covered by dressing became extremely red and itchy.   Replaced with gauze and paper tape.  No redness or drainage to incision , sutures were intact.   Added tegaderm allergy to patient list.

## 2024-02-13 DIAGNOSIS — M7501 Adhesive capsulitis of right shoulder: Secondary | ICD-10-CM

## 2024-02-15 ENCOUNTER — Other Ambulatory Visit: Payer: Self-pay | Admitting: Orthopedic Surgery

## 2024-02-15 MED ORDER — CYCLOBENZAPRINE HCL 10 MG PO TABS: 10.0000 mg | ORAL_TABLET | Freq: Three times a day (TID) | ORAL | 0 refills | Status: DC | PRN

## 2024-02-15 MED ORDER — OXYCODONE HCL 5 MG PO TABS: 10.0000 mg | ORAL_TABLET | ORAL | 0 refills | Status: DC | PRN

## 2024-02-15 MED ORDER — DICLOFENAC SODIUM 75 MG PO TBEC: 75.0000 mg | DELAYED_RELEASE_TABLET | Freq: Two times a day (BID) | ORAL | 0 refills | Status: DC

## 2024-02-15 NOTE — Progress Notes (Signed)
 Orthopedic Telephone Note  Patient called the on-call number today.  She stated that she is having severe pain that is not well-controlled with the 10 mg of Percocet she was given.  She said she was on 5 mg of Percocet before surgery.  She recently underwent a right shoulder manipulation under anesthesia with rotator interval release.  She said her pain is to the point that she cannot do any of the therapy.  She said she has been taking the 10 mg of Percocet and Tylenol  500 mg every 8 hours.  She said even within an hour of taking those medications she gets essentially no pain relief.  I have prescribed her diclofenac  that she can take twice per day.  She can also take Tylenol  1000 mg 3 times a day.  I sent in Flexeril  for her as well.  I also sent in oxycodone  15 mg to be taken temporarily so she can work on her therapy exercises.    Valerie DELENA Ada, MD Orthopedic Surgeon

## 2024-02-18 ENCOUNTER — Ambulatory Visit: Admitting: Surgical

## 2024-02-19 MED ORDER — OXYCODONE HCL 5 MG PO TABS: 10.0000 mg | ORAL_TABLET | ORAL | 0 refills | Status: AC | PRN

## 2024-02-19 MED ORDER — DICLOFENAC SODIUM 75 MG PO TBEC: 75.0000 mg | DELAYED_RELEASE_TABLET | Freq: Two times a day (BID) | ORAL | 1 refills | Status: AC

## 2024-02-19 MED ORDER — CYCLOBENZAPRINE HCL 10 MG PO TABS: 10.0000 mg | ORAL_TABLET | Freq: Three times a day (TID) | ORAL | 1 refills | Status: AC | PRN

## 2024-03-17 ENCOUNTER — Encounter: Admitting: Surgical
